# Patient Record
Sex: Female | Born: 1940 | Race: Black or African American | Hispanic: No | Smoking: Former smoker
Health system: Southern US, Community
[De-identification: ages and names within clinical notes are randomized; demographics above are authoritative.]

## PROBLEM LIST (undated history)

## (undated) DIAGNOSIS — M810 Age-related osteoporosis without current pathological fracture: Secondary | ICD-10-CM

## (undated) DIAGNOSIS — J45909 Unspecified asthma, uncomplicated: Secondary | ICD-10-CM

## (undated) DIAGNOSIS — J449 Chronic obstructive pulmonary disease, unspecified: Secondary | ICD-10-CM

## (undated) DIAGNOSIS — I011 Acute rheumatic endocarditis: Secondary | ICD-10-CM

## (undated) DIAGNOSIS — I10 Essential (primary) hypertension: Secondary | ICD-10-CM

## (undated) DIAGNOSIS — E785 Hyperlipidemia, unspecified: Secondary | ICD-10-CM

## (undated) DIAGNOSIS — M1990 Unspecified osteoarthritis, unspecified site: Secondary | ICD-10-CM

## (undated) DIAGNOSIS — M797 Fibromyalgia: Secondary | ICD-10-CM

## (undated) DIAGNOSIS — J40 Bronchitis, not specified as acute or chronic: Secondary | ICD-10-CM

## (undated) DIAGNOSIS — G4730 Sleep apnea, unspecified: Secondary | ICD-10-CM

## (undated) HISTORY — PX: TOTAL KNEE ARTHROPLASTY: SHX125

## (undated) HISTORY — DX: Unspecified osteoarthritis, unspecified site: M19.90

## (undated) HISTORY — DX: Fibromyalgia: M79.7

## (undated) HISTORY — DX: Sleep apnea, unspecified: G47.30

## (undated) HISTORY — PX: JOINT REPLACEMENT: SHX530

## (undated) HISTORY — PX: KNEE ARTHROPLASTY: SHX992

## (undated) HISTORY — DX: Hyperlipidemia, unspecified: E78.5

## (undated) HISTORY — DX: Acute rheumatic endocarditis: I01.1

## (undated) HISTORY — DX: Unspecified asthma, uncomplicated: J45.909

## (undated) HISTORY — DX: Age-related osteoporosis without current pathological fracture: M81.0

---

## 1997-12-08 ENCOUNTER — Ambulatory Visit (HOSPITAL_COMMUNITY): Admission: RE | Admit: 1997-12-08 | Discharge: 1997-12-08 | Payer: Self-pay | Admitting: Internal Medicine

## 1998-06-27 ENCOUNTER — Ambulatory Visit (HOSPITAL_COMMUNITY): Admission: RE | Admit: 1998-06-27 | Discharge: 1998-06-27 | Payer: Self-pay | Admitting: Internal Medicine

## 1998-10-06 ENCOUNTER — Encounter: Payer: Self-pay | Admitting: Obstetrics and Gynecology

## 1998-10-06 ENCOUNTER — Ambulatory Visit (HOSPITAL_COMMUNITY): Admission: RE | Admit: 1998-10-06 | Discharge: 1998-10-06 | Payer: Self-pay | Admitting: Obstetrics and Gynecology

## 1999-09-07 ENCOUNTER — Observation Stay (HOSPITAL_COMMUNITY): Admission: AD | Admit: 1999-09-07 | Discharge: 1999-09-08 | Payer: Self-pay | Admitting: Internal Medicine

## 1999-09-07 ENCOUNTER — Encounter: Payer: Self-pay | Admitting: Internal Medicine

## 1999-09-14 ENCOUNTER — Encounter: Admission: RE | Admit: 1999-09-14 | Discharge: 1999-12-13 | Payer: Self-pay | Admitting: Internal Medicine

## 1999-10-15 ENCOUNTER — Encounter: Payer: Self-pay | Admitting: Internal Medicine

## 1999-10-15 ENCOUNTER — Ambulatory Visit (HOSPITAL_COMMUNITY): Admission: RE | Admit: 1999-10-15 | Discharge: 1999-10-15 | Payer: Self-pay | Admitting: Internal Medicine

## 1999-12-22 ENCOUNTER — Encounter: Admission: RE | Admit: 1999-12-22 | Discharge: 2000-03-21 | Payer: Self-pay | Admitting: Internal Medicine

## 1999-12-24 ENCOUNTER — Encounter: Admission: RE | Admit: 1999-12-24 | Discharge: 1999-12-24 | Payer: Self-pay | Admitting: *Deleted

## 1999-12-24 ENCOUNTER — Encounter: Payer: Self-pay | Admitting: Rheumatology

## 2000-01-19 ENCOUNTER — Encounter: Admission: RE | Admit: 2000-01-19 | Discharge: 2000-01-19 | Payer: Self-pay | Admitting: Internal Medicine

## 2000-01-19 ENCOUNTER — Encounter: Payer: Self-pay | Admitting: Internal Medicine

## 2000-02-22 ENCOUNTER — Ambulatory Visit (HOSPITAL_COMMUNITY): Admission: RE | Admit: 2000-02-22 | Discharge: 2000-02-22 | Payer: Self-pay | Admitting: Gastroenterology

## 2000-02-23 ENCOUNTER — Other Ambulatory Visit: Admission: RE | Admit: 2000-02-23 | Discharge: 2000-02-23 | Payer: Self-pay | Admitting: Obstetrics and Gynecology

## 2000-05-02 ENCOUNTER — Encounter (INDEPENDENT_AMBULATORY_CARE_PROVIDER_SITE_OTHER): Payer: Self-pay | Admitting: Specialist

## 2000-05-02 ENCOUNTER — Other Ambulatory Visit: Admission: RE | Admit: 2000-05-02 | Discharge: 2000-05-02 | Payer: Self-pay | Admitting: Obstetrics and Gynecology

## 2000-06-12 ENCOUNTER — Encounter: Admission: RE | Admit: 2000-06-12 | Discharge: 2000-09-10 | Payer: Self-pay | Admitting: Internal Medicine

## 2000-06-29 ENCOUNTER — Encounter: Admission: RE | Admit: 2000-06-29 | Discharge: 2000-06-29 | Payer: Self-pay | Admitting: Internal Medicine

## 2000-06-29 ENCOUNTER — Encounter: Payer: Self-pay | Admitting: Internal Medicine

## 2000-11-24 ENCOUNTER — Ambulatory Visit (HOSPITAL_COMMUNITY): Admission: RE | Admit: 2000-11-24 | Discharge: 2000-11-24 | Payer: Self-pay | Admitting: Internal Medicine

## 2001-03-12 ENCOUNTER — Ambulatory Visit (HOSPITAL_COMMUNITY): Admission: RE | Admit: 2001-03-12 | Discharge: 2001-03-12 | Payer: Self-pay | Admitting: Internal Medicine

## 2001-03-12 ENCOUNTER — Encounter: Payer: Self-pay | Admitting: Internal Medicine

## 2001-06-26 ENCOUNTER — Ambulatory Visit (HOSPITAL_COMMUNITY): Admission: RE | Admit: 2001-06-26 | Discharge: 2001-06-26 | Payer: Self-pay | Admitting: Internal Medicine

## 2001-07-17 ENCOUNTER — Encounter: Payer: Self-pay | Admitting: Orthopedic Surgery

## 2001-07-19 ENCOUNTER — Encounter: Payer: Self-pay | Admitting: Orthopedic Surgery

## 2001-07-19 ENCOUNTER — Inpatient Hospital Stay (HOSPITAL_COMMUNITY): Admission: RE | Admit: 2001-07-19 | Discharge: 2001-07-23 | Payer: Self-pay | Admitting: Orthopedic Surgery

## 2001-07-23 ENCOUNTER — Inpatient Hospital Stay (HOSPITAL_COMMUNITY)
Admission: RE | Admit: 2001-07-23 | Discharge: 2001-07-30 | Payer: Self-pay | Admitting: Physical Medicine & Rehabilitation

## 2001-09-04 ENCOUNTER — Ambulatory Visit (HOSPITAL_COMMUNITY): Admission: RE | Admit: 2001-09-04 | Discharge: 2001-09-04 | Payer: Self-pay | Admitting: Internal Medicine

## 2001-12-26 ENCOUNTER — Encounter: Admission: RE | Admit: 2001-12-26 | Discharge: 2001-12-26 | Payer: Self-pay | Admitting: Orthopedic Surgery

## 2001-12-26 ENCOUNTER — Encounter: Payer: Self-pay | Admitting: Orthopedic Surgery

## 2002-01-09 ENCOUNTER — Encounter: Admission: RE | Admit: 2002-01-09 | Discharge: 2002-01-09 | Payer: Self-pay | Admitting: Orthopedic Surgery

## 2002-01-09 ENCOUNTER — Encounter: Payer: Self-pay | Admitting: Orthopedic Surgery

## 2002-01-23 ENCOUNTER — Encounter: Payer: Self-pay | Admitting: Orthopedic Surgery

## 2002-01-23 ENCOUNTER — Encounter: Admission: RE | Admit: 2002-01-23 | Discharge: 2002-01-23 | Payer: Self-pay | Admitting: Orthopedic Surgery

## 2002-04-09 ENCOUNTER — Inpatient Hospital Stay (HOSPITAL_COMMUNITY): Admission: AD | Admit: 2002-04-09 | Discharge: 2002-04-20 | Payer: Self-pay | Admitting: Internal Medicine

## 2002-04-11 ENCOUNTER — Encounter: Payer: Self-pay | Admitting: Internal Medicine

## 2002-04-11 ENCOUNTER — Encounter (INDEPENDENT_AMBULATORY_CARE_PROVIDER_SITE_OTHER): Payer: Self-pay | Admitting: Cardiology

## 2002-04-23 ENCOUNTER — Inpatient Hospital Stay (HOSPITAL_COMMUNITY): Admission: EM | Admit: 2002-04-23 | Discharge: 2002-04-26 | Payer: Self-pay | Admitting: Emergency Medicine

## 2002-04-25 ENCOUNTER — Encounter: Payer: Self-pay | Admitting: Internal Medicine

## 2002-06-10 ENCOUNTER — Encounter: Admission: RE | Admit: 2002-06-10 | Discharge: 2002-06-10 | Payer: Self-pay | Admitting: Internal Medicine

## 2002-06-10 ENCOUNTER — Encounter: Payer: Self-pay | Admitting: Internal Medicine

## 2002-06-19 ENCOUNTER — Encounter: Payer: Self-pay | Admitting: Internal Medicine

## 2002-06-19 ENCOUNTER — Encounter: Admission: RE | Admit: 2002-06-19 | Discharge: 2002-06-19 | Payer: Self-pay | Admitting: Internal Medicine

## 2002-11-08 ENCOUNTER — Encounter: Admission: RE | Admit: 2002-11-08 | Discharge: 2002-11-08 | Payer: Self-pay | Admitting: Rheumatology

## 2002-11-08 ENCOUNTER — Encounter: Payer: Self-pay | Admitting: Rheumatology

## 2003-01-13 ENCOUNTER — Encounter: Payer: Self-pay | Admitting: Rheumatology

## 2003-01-13 ENCOUNTER — Encounter: Admission: RE | Admit: 2003-01-13 | Discharge: 2003-01-13 | Payer: Self-pay | Admitting: Rheumatology

## 2003-01-14 ENCOUNTER — Other Ambulatory Visit: Admission: RE | Admit: 2003-01-14 | Discharge: 2003-01-14 | Payer: Self-pay | Admitting: Obstetrics and Gynecology

## 2003-02-04 ENCOUNTER — Encounter: Admission: RE | Admit: 2003-02-04 | Discharge: 2003-02-04 | Payer: Self-pay | Admitting: Rheumatology

## 2003-02-04 ENCOUNTER — Encounter: Payer: Self-pay | Admitting: Rheumatology

## 2003-07-07 ENCOUNTER — Encounter: Payer: Self-pay | Admitting: Obstetrics and Gynecology

## 2003-07-07 ENCOUNTER — Encounter: Admission: RE | Admit: 2003-07-07 | Discharge: 2003-07-07 | Payer: Self-pay | Admitting: Obstetrics and Gynecology

## 2003-07-14 ENCOUNTER — Encounter: Payer: Self-pay | Admitting: Rheumatology

## 2003-07-14 ENCOUNTER — Encounter: Admission: RE | Admit: 2003-07-14 | Discharge: 2003-07-14 | Payer: Self-pay | Admitting: Rheumatology

## 2003-08-05 ENCOUNTER — Emergency Department (HOSPITAL_COMMUNITY): Admission: EM | Admit: 2003-08-05 | Discharge: 2003-08-06 | Payer: Self-pay | Admitting: Emergency Medicine

## 2003-08-08 ENCOUNTER — Encounter: Admission: RE | Admit: 2003-08-08 | Discharge: 2003-08-08 | Payer: Self-pay | Admitting: Rheumatology

## 2003-09-10 ENCOUNTER — Encounter: Admission: RE | Admit: 2003-09-10 | Discharge: 2003-09-10 | Payer: Self-pay | Admitting: Rheumatology

## 2003-10-06 ENCOUNTER — Encounter: Admission: RE | Admit: 2003-10-06 | Discharge: 2003-10-06 | Payer: Self-pay | Admitting: Rheumatology

## 2003-12-02 ENCOUNTER — Encounter: Admission: RE | Admit: 2003-12-02 | Discharge: 2003-12-02 | Payer: Self-pay | Admitting: Internal Medicine

## 2003-12-11 ENCOUNTER — Inpatient Hospital Stay (HOSPITAL_COMMUNITY): Admission: AD | Admit: 2003-12-11 | Discharge: 2003-12-18 | Payer: Self-pay | Admitting: Internal Medicine

## 2004-04-21 ENCOUNTER — Ambulatory Visit (HOSPITAL_COMMUNITY): Admission: RE | Admit: 2004-04-21 | Discharge: 2004-04-21 | Payer: Self-pay | Admitting: Cardiology

## 2004-07-14 ENCOUNTER — Encounter: Admission: RE | Admit: 2004-07-14 | Discharge: 2004-07-14 | Payer: Self-pay | Admitting: Internal Medicine

## 2004-10-18 ENCOUNTER — Ambulatory Visit: Payer: Self-pay | Admitting: Internal Medicine

## 2004-10-20 ENCOUNTER — Encounter: Admission: RE | Admit: 2004-10-20 | Discharge: 2004-10-20 | Payer: Self-pay | Admitting: Internal Medicine

## 2004-10-26 ENCOUNTER — Emergency Department (HOSPITAL_COMMUNITY): Admission: EM | Admit: 2004-10-26 | Discharge: 2004-10-26 | Payer: Self-pay | Admitting: Emergency Medicine

## 2004-10-30 IMAGING — CT CT CHEST W/ CM
1 of 2 series · 16 of 32 positions shown, 20 images · IV contrast (omnipaque)
Comparison: none

CLINICAL DATA: Cough.  Bronchitis.  Asthma.  Tachycardia.  
 CT SCAN OF THE CHEST WITH INTRAVENOUS CONTRAST 12/14/03
 Scans were performed using the pulmonary embolus protocol.  The patient was given 150 cc of Omnipaque intravenously.  
 The scan demonstrates no evidence of pulmonary embolus or other acute abnormality.  There is some scarring in the lingula as was reported on the prior CT scan dated 01/19/00.  Heart size is normal.  The patient has a tiny focal eventration of the left hemidiaphragm with some lobulated fat seen medially at the left lung base posteriorly through this small diaphragmatic defect.  This is not significant.  
 There is no hilar or mediastinal adenopathy or other significant abnormality. 
 IMPRESSION
 No acute disease.  Specifically, no evidence of mass lesion other than some herniated abdominal fat.  No pulmonary embolus.

[Series 2: infused scan · axial · 0.70mm/px · z∈[-272,-23]mm · 16 of 206 slices shown, 20 images]
[im 13/206  mediastinal]
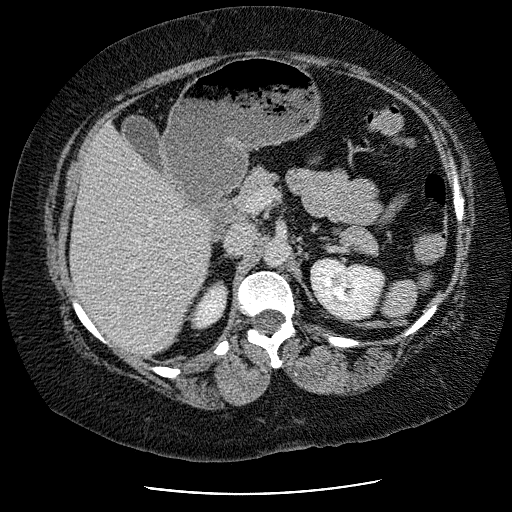
[im 13/206  lung]
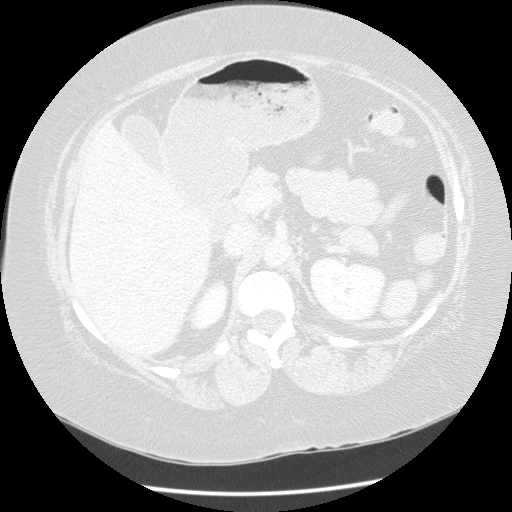
[im 25/206  lung]
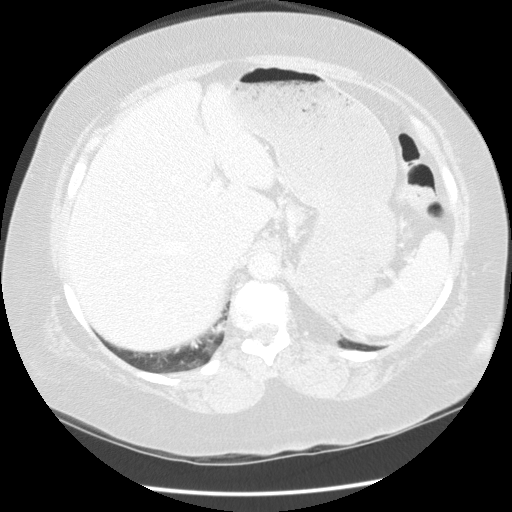
[im 37/206  lung]
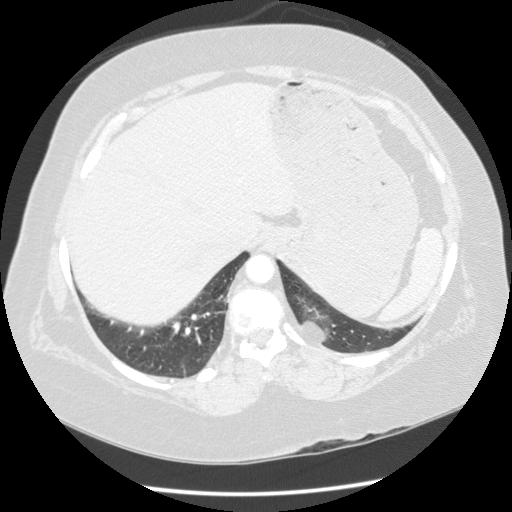
[im 61/206  lung]
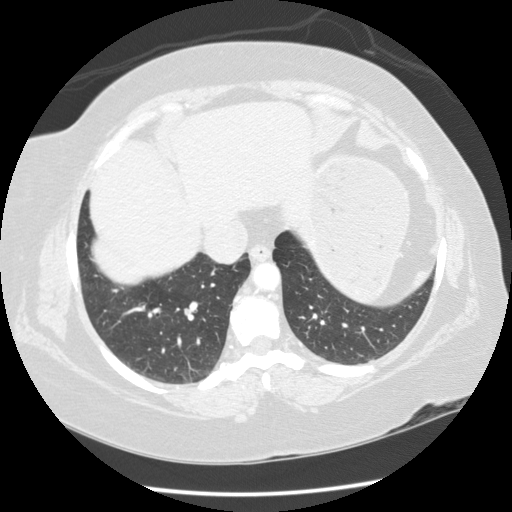
[im 69/206  mediastinal]
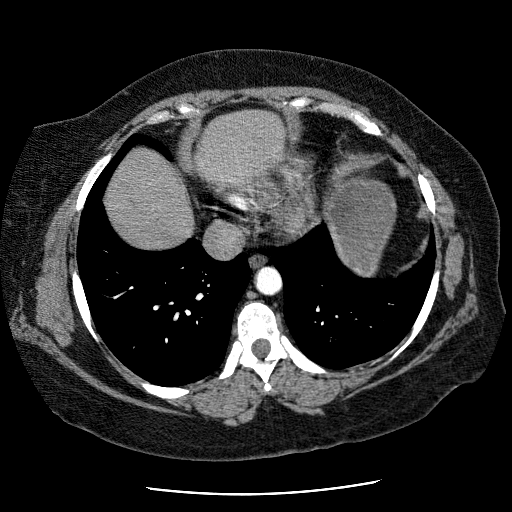
[im 69/206  lung]
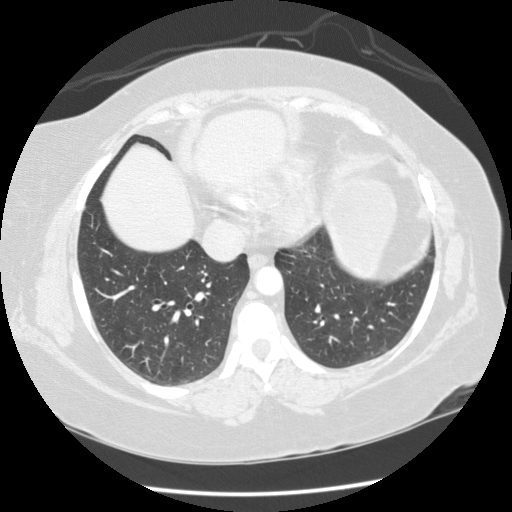
[im 73/206  lung]
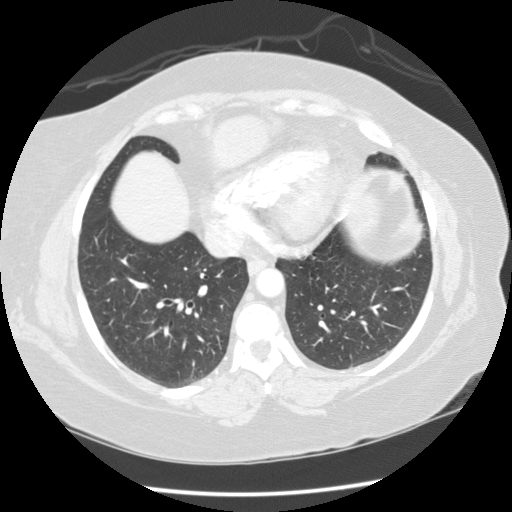
[im 81/206  lung]
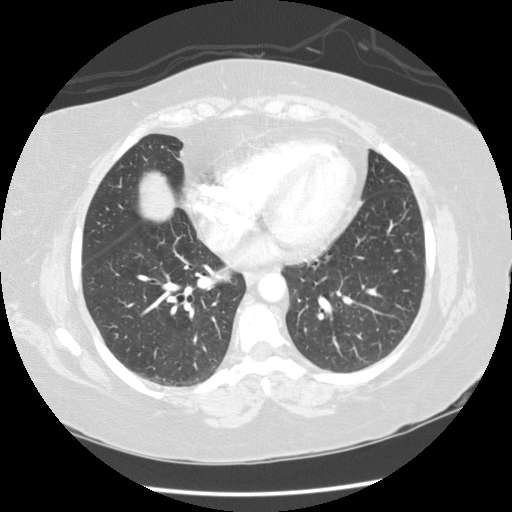
[im 85/206  lung]
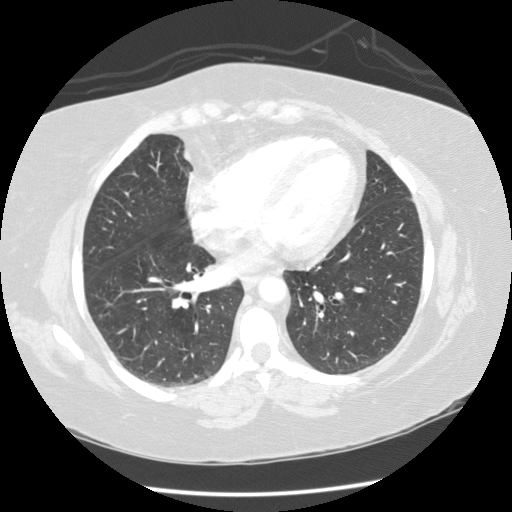
[im 109/206  mediastinal]
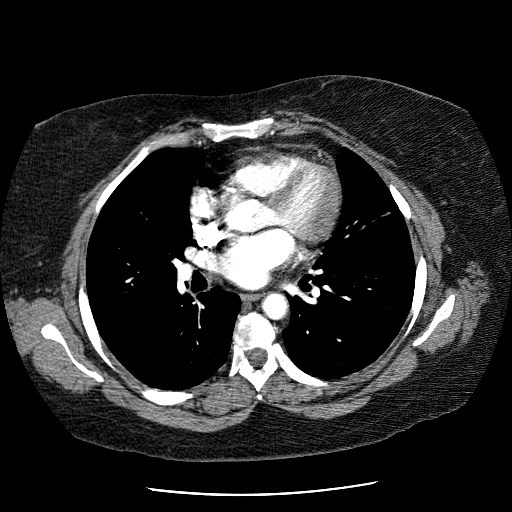
[im 109/206  lung]
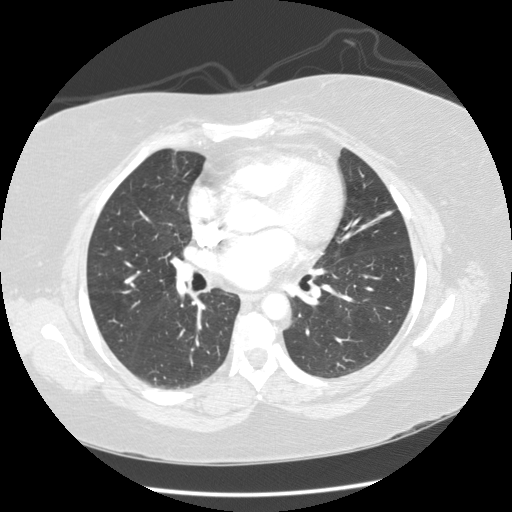
[im 121/206  lung]
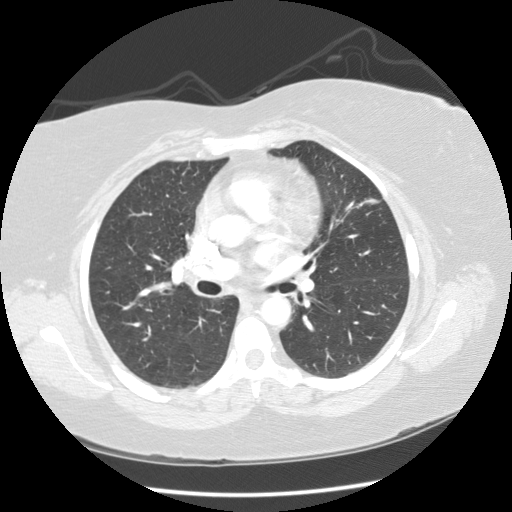
[im 133/206  lung]
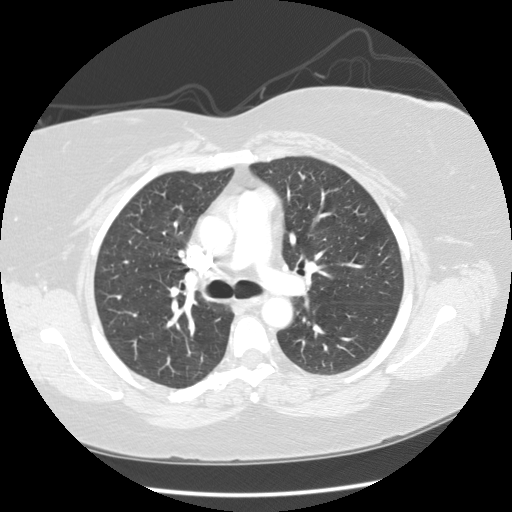
[im 137/206  lung]
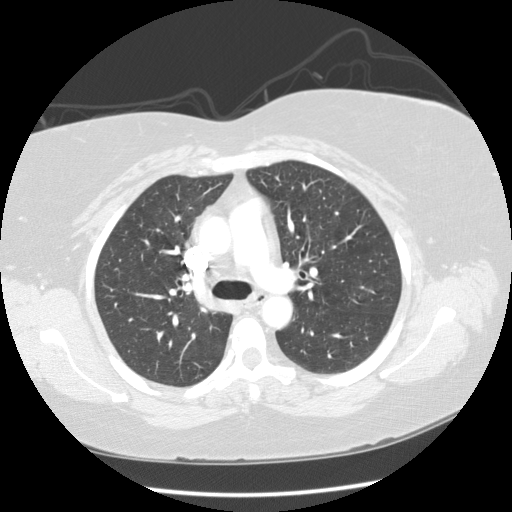
[im 145/206  mediastinal]
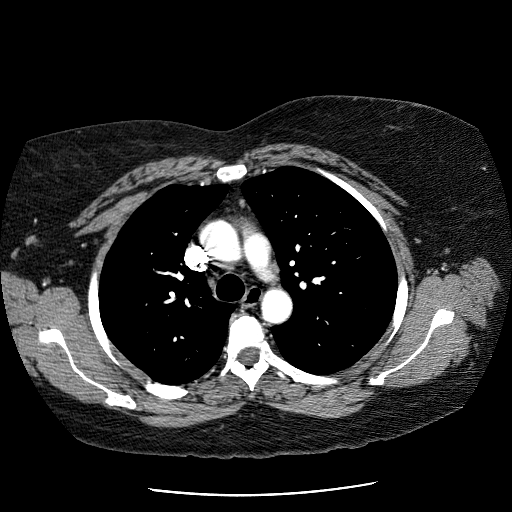
[im 145/206  lung]
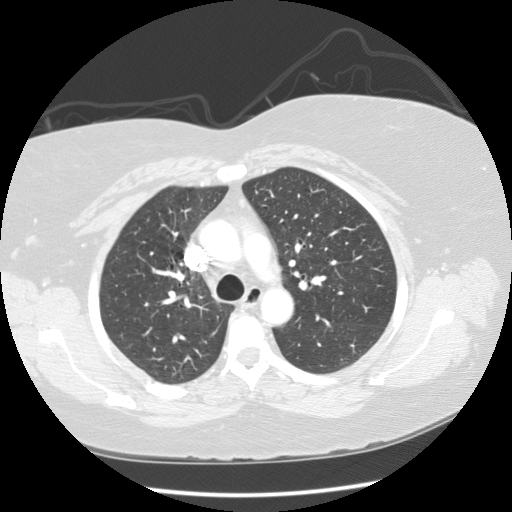
[im 169/206  lung]
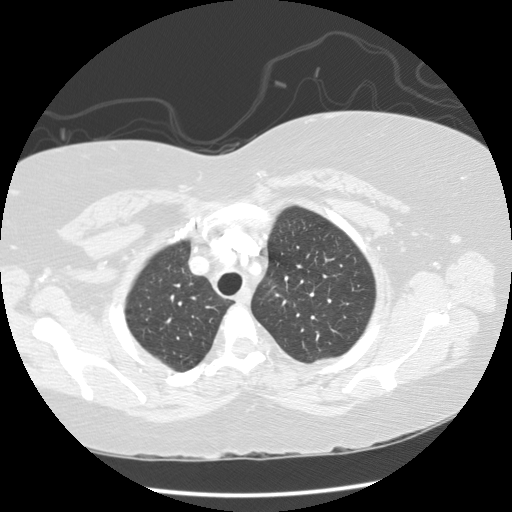
[im 181/206  lung]
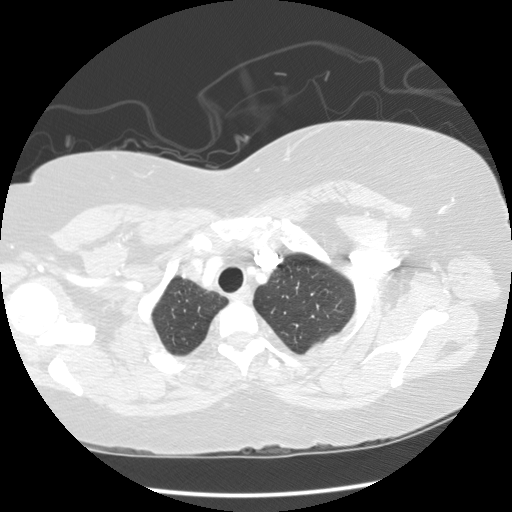
[im 193/206  lung]
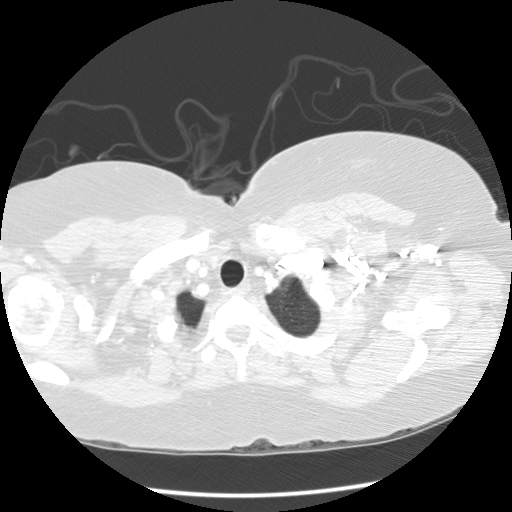

[16 of 32 positions shown; findings below may reference images not displayed]

## 2004-11-16 ENCOUNTER — Ambulatory Visit: Payer: Self-pay | Admitting: Internal Medicine

## 2004-12-29 ENCOUNTER — Encounter: Admission: RE | Admit: 2004-12-29 | Discharge: 2005-03-29 | Payer: Self-pay | Admitting: Internal Medicine

## 2005-01-03 ENCOUNTER — Ambulatory Visit: Payer: Self-pay | Admitting: Internal Medicine

## 2005-04-21 ENCOUNTER — Encounter: Admission: RE | Admit: 2005-04-21 | Discharge: 2005-07-20 | Payer: Self-pay | Admitting: Internal Medicine

## 2006-01-04 ENCOUNTER — Encounter: Admission: RE | Admit: 2006-01-04 | Discharge: 2006-01-04 | Payer: Self-pay | Admitting: Internal Medicine

## 2006-02-05 ENCOUNTER — Inpatient Hospital Stay (HOSPITAL_COMMUNITY): Admission: RE | Admit: 2006-02-05 | Discharge: 2006-02-14 | Payer: Self-pay | Admitting: Internal Medicine

## 2006-02-11 ENCOUNTER — Encounter: Payer: Self-pay | Admitting: Cardiology

## 2007-02-18 ENCOUNTER — Emergency Department (HOSPITAL_COMMUNITY): Admission: EM | Admit: 2007-02-18 | Discharge: 2007-02-19 | Payer: Self-pay | Admitting: Emergency Medicine

## 2007-02-19 ENCOUNTER — Inpatient Hospital Stay (HOSPITAL_COMMUNITY): Admission: AD | Admit: 2007-02-19 | Discharge: 2007-02-23 | Payer: Self-pay | Admitting: Internal Medicine

## 2007-02-22 ENCOUNTER — Ambulatory Visit: Payer: Self-pay | Admitting: Vascular Surgery

## 2007-03-14 ENCOUNTER — Encounter: Admission: RE | Admit: 2007-03-14 | Discharge: 2007-03-14 | Payer: Self-pay | Admitting: Internal Medicine

## 2007-04-03 ENCOUNTER — Ambulatory Visit: Payer: Self-pay | Admitting: Internal Medicine

## 2007-04-03 DIAGNOSIS — E119 Type 2 diabetes mellitus without complications: Secondary | ICD-10-CM | POA: Insufficient documentation

## 2007-04-03 DIAGNOSIS — J45909 Unspecified asthma, uncomplicated: Secondary | ICD-10-CM

## 2007-04-03 DIAGNOSIS — M5030 Other cervical disc degeneration, unspecified cervical region: Secondary | ICD-10-CM

## 2007-04-03 DIAGNOSIS — Z8679 Personal history of other diseases of the circulatory system: Secondary | ICD-10-CM | POA: Insufficient documentation

## 2007-04-03 DIAGNOSIS — M5136 Other intervertebral disc degeneration, lumbar region: Secondary | ICD-10-CM | POA: Insufficient documentation

## 2007-04-03 DIAGNOSIS — M1711 Unilateral primary osteoarthritis, right knee: Secondary | ICD-10-CM | POA: Insufficient documentation

## 2007-04-03 DIAGNOSIS — E785 Hyperlipidemia, unspecified: Secondary | ICD-10-CM | POA: Insufficient documentation

## 2007-04-03 DIAGNOSIS — M0579 Rheumatoid arthritis with rheumatoid factor of multiple sites without organ or systems involvement: Secondary | ICD-10-CM

## 2007-04-03 DIAGNOSIS — E039 Hypothyroidism, unspecified: Secondary | ICD-10-CM

## 2007-04-03 DIAGNOSIS — M797 Fibromyalgia: Secondary | ICD-10-CM

## 2007-04-03 DIAGNOSIS — K219 Gastro-esophageal reflux disease without esophagitis: Secondary | ICD-10-CM

## 2007-04-03 DIAGNOSIS — R7611 Nonspecific reaction to tuberculin skin test without active tuberculosis: Secondary | ICD-10-CM | POA: Insufficient documentation

## 2007-04-03 DIAGNOSIS — Z96659 Presence of unspecified artificial knee joint: Secondary | ICD-10-CM

## 2007-04-17 ENCOUNTER — Ambulatory Visit: Payer: Self-pay | Admitting: Internal Medicine

## 2007-04-19 ENCOUNTER — Ambulatory Visit: Payer: Self-pay | Admitting: Internal Medicine

## 2007-04-19 ENCOUNTER — Ambulatory Visit (HOSPITAL_COMMUNITY): Admission: RE | Admit: 2007-04-19 | Discharge: 2007-04-19 | Payer: Self-pay | Admitting: Internal Medicine

## 2007-04-19 LAB — CONVERTED CEMR LAB
ALT: 12 units/L (ref 0–35)
AST: 13 units/L (ref 0–37)
Alkaline Phosphatase: 124 units/L — ABNORMAL HIGH (ref 39–117)
BUN: 16 mg/dL (ref 6–23)
Blood Glucose, Fingerstick: 124
Calcium: 8.9 mg/dL (ref 8.4–10.5)
Creatinine, Ser: 1.07 mg/dL (ref 0.40–1.20)
HCT: 38.1 % (ref 36.0–46.0)
Hemoglobin: 11.8 g/dL — ABNORMAL LOW (ref 12.0–15.0)
MCHC: 31 g/dL (ref 30.0–36.0)
MCV: 89 fL (ref 78.0–100.0)
Platelets: 293 10*3/uL (ref 150–400)
RDW: 15.3 % — ABNORMAL HIGH (ref 11.5–14.0)
Total Bilirubin: 0.4 mg/dL (ref 0.3–1.2)

## 2007-04-20 ENCOUNTER — Encounter: Admission: RE | Admit: 2007-04-20 | Discharge: 2007-06-13 | Payer: Self-pay | Admitting: Neurological Surgery

## 2007-05-31 ENCOUNTER — Encounter: Admission: RE | Admit: 2007-05-31 | Discharge: 2007-06-13 | Payer: Self-pay | Admitting: *Deleted

## 2007-06-03 ENCOUNTER — Inpatient Hospital Stay (HOSPITAL_COMMUNITY): Admission: EM | Admit: 2007-06-03 | Discharge: 2007-06-09 | Payer: Self-pay | Admitting: Emergency Medicine

## 2007-07-03 ENCOUNTER — Encounter (HOSPITAL_COMMUNITY): Admission: RE | Admit: 2007-07-03 | Discharge: 2007-10-01 | Payer: Self-pay | Admitting: Internal Medicine

## 2007-08-27 ENCOUNTER — Encounter: Admission: RE | Admit: 2007-08-27 | Discharge: 2007-08-27 | Payer: Self-pay | Admitting: Rheumatology

## 2007-09-14 ENCOUNTER — Encounter: Admission: RE | Admit: 2007-09-14 | Discharge: 2007-09-14 | Payer: Self-pay | Admitting: Rheumatology

## 2007-10-12 ENCOUNTER — Encounter: Admission: RE | Admit: 2007-10-12 | Discharge: 2007-10-12 | Payer: Self-pay | Admitting: Rheumatology

## 2008-05-15 ENCOUNTER — Ambulatory Visit: Payer: Self-pay | Admitting: Internal Medicine

## 2008-05-15 DIAGNOSIS — G4733 Obstructive sleep apnea (adult) (pediatric): Secondary | ICD-10-CM

## 2008-05-28 ENCOUNTER — Encounter: Payer: Self-pay | Admitting: Internal Medicine

## 2008-06-03 ENCOUNTER — Encounter: Payer: Self-pay | Admitting: Internal Medicine

## 2008-06-16 ENCOUNTER — Ambulatory Visit: Payer: Self-pay | Admitting: Internal Medicine

## 2008-07-30 ENCOUNTER — Ambulatory Visit: Payer: Self-pay | Admitting: Vascular Surgery

## 2008-09-15 ENCOUNTER — Ambulatory Visit: Payer: Self-pay | Admitting: Internal Medicine

## 2008-09-24 ENCOUNTER — Encounter: Payer: Self-pay | Admitting: Internal Medicine

## 2008-11-10 ENCOUNTER — Telehealth: Payer: Self-pay | Admitting: Internal Medicine

## 2009-01-13 ENCOUNTER — Ambulatory Visit: Payer: Self-pay | Admitting: Internal Medicine

## 2009-06-24 ENCOUNTER — Encounter: Admission: RE | Admit: 2009-06-24 | Discharge: 2009-06-24 | Payer: Self-pay | Admitting: Internal Medicine

## 2009-07-16 ENCOUNTER — Emergency Department (HOSPITAL_COMMUNITY): Admission: EM | Admit: 2009-07-16 | Discharge: 2009-07-16 | Payer: Self-pay | Admitting: Emergency Medicine

## 2009-08-19 ENCOUNTER — Encounter: Admission: RE | Admit: 2009-08-19 | Discharge: 2009-08-19 | Payer: Self-pay | Admitting: Internal Medicine

## 2010-02-19 ENCOUNTER — Encounter: Admission: RE | Admit: 2010-02-19 | Discharge: 2010-02-19 | Payer: Self-pay | Admitting: Internal Medicine

## 2010-06-02 IMAGING — CR DG CHEST 2V
2 series · 2 of 2 positions shown · non-contrast
Comparison: Chest x-ray of 06/24/2009

CLINICAL DATA: Shortness of breath, chest pain, asthma

CHEST - 2 VIEW

[w chest pa]
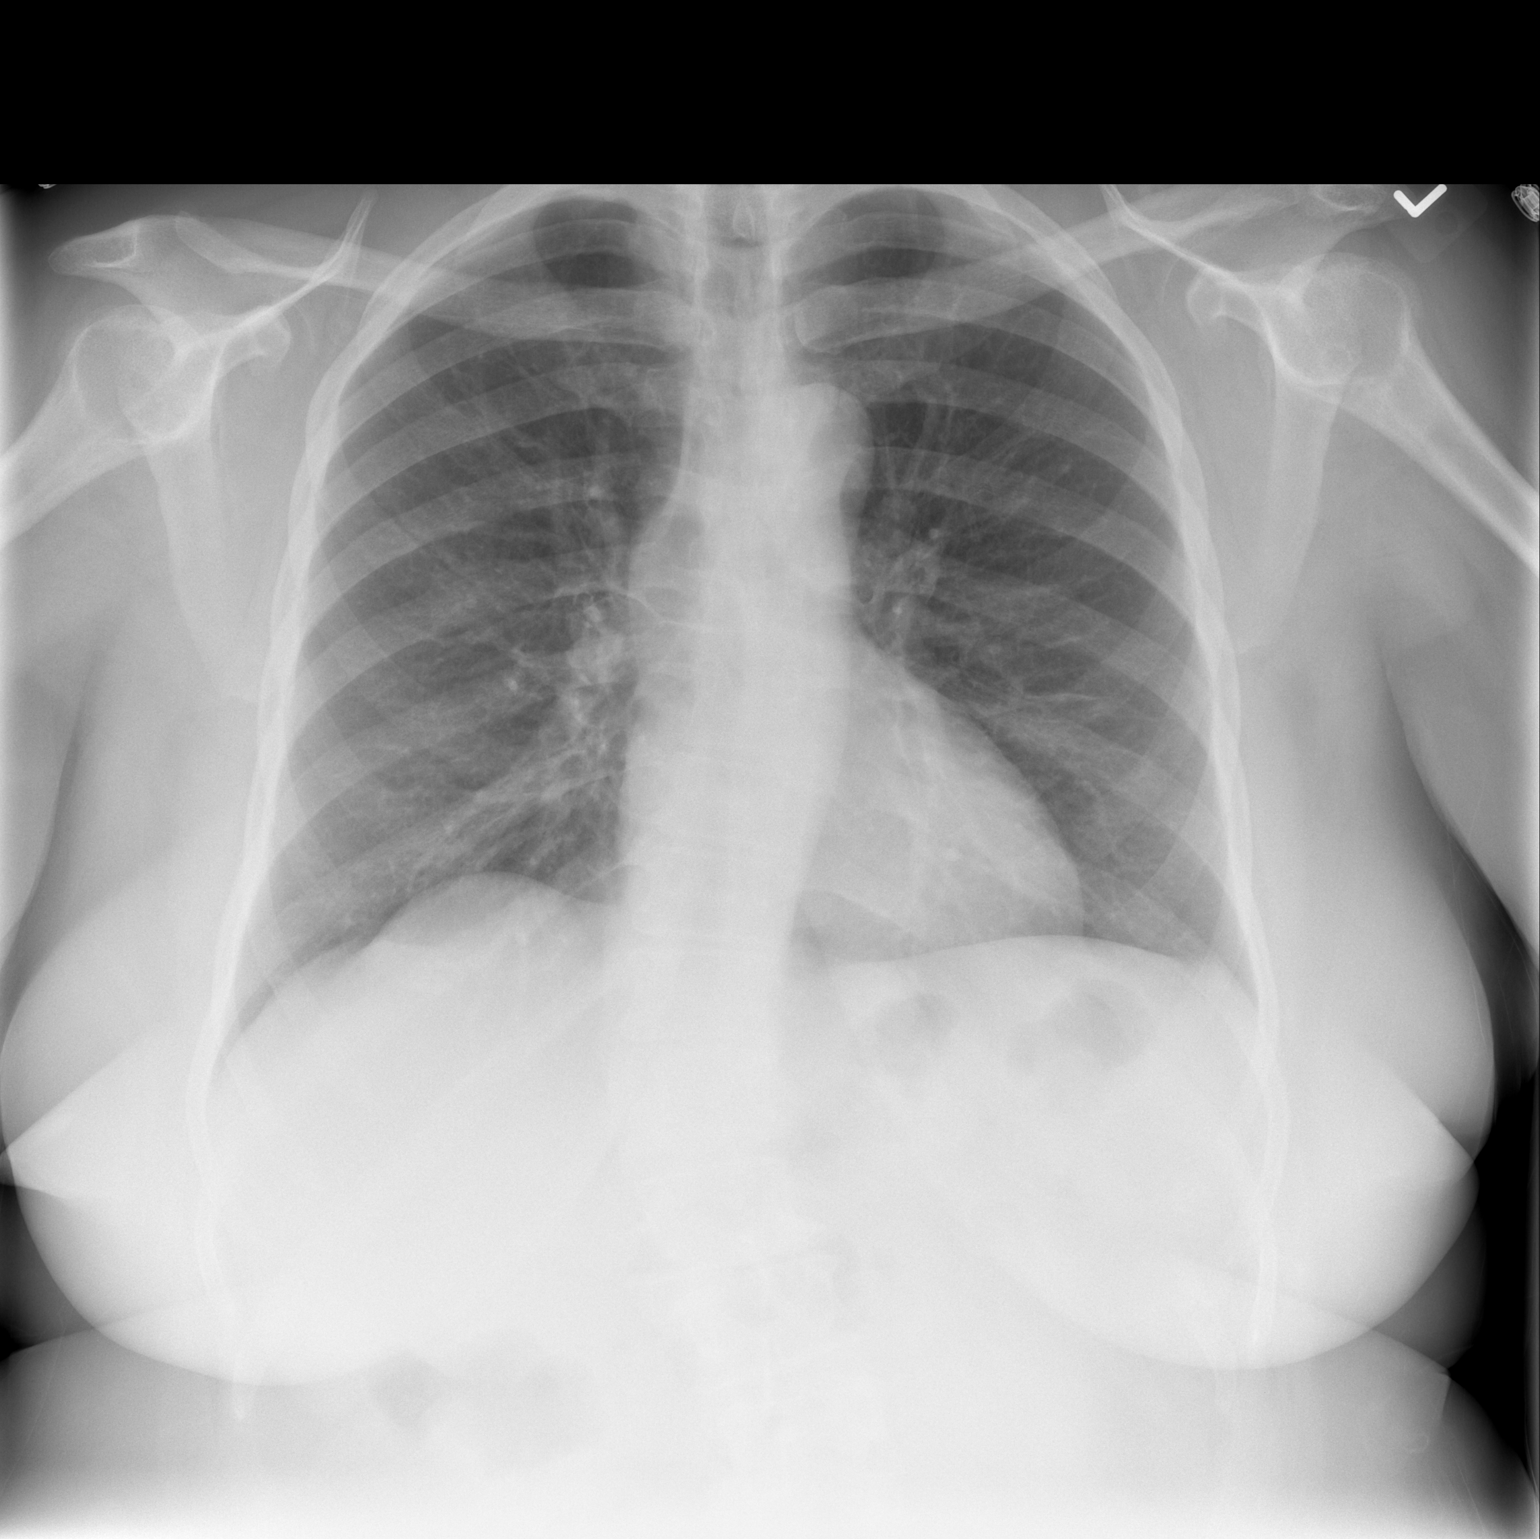

[w chest lat]
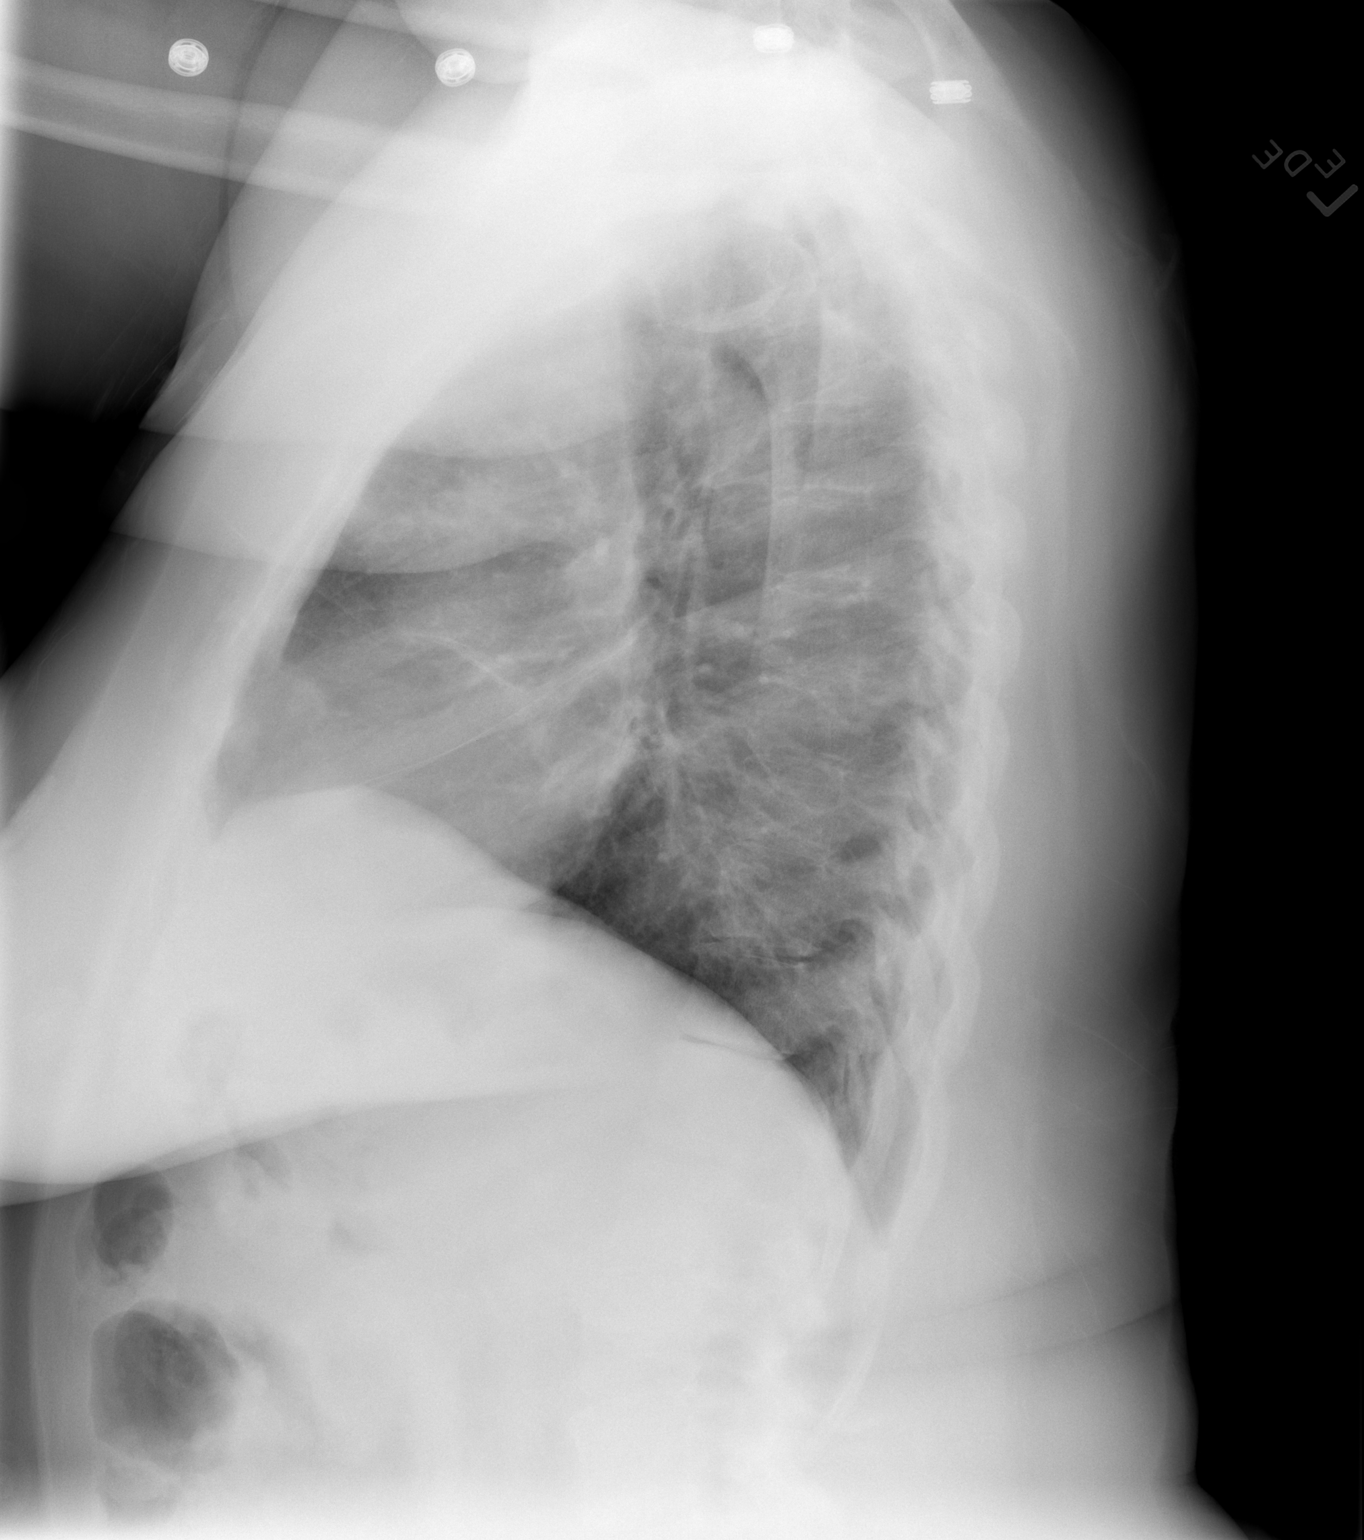

[2 of 2 positions shown; findings below may reference images not displayed]

FINDINGS: No focal infiltrate or effusion is seen.  Mild
peribronchial thickening is present which may indicate bronchitis.
The heart is within normal limits in size. There is mild
thoracolumbar scoliosis present.
IMPRESSION: No pneumonia.  Cannot exclude mild bronchitis.  Scoliosis.

## 2010-06-22 ENCOUNTER — Ambulatory Visit: Payer: Self-pay | Admitting: Cardiology

## 2010-06-25 ENCOUNTER — Ambulatory Visit (HOSPITAL_COMMUNITY): Admission: RE | Admit: 2010-06-25 | Discharge: 2010-06-25 | Payer: Self-pay | Admitting: Internal Medicine

## 2010-10-01 ENCOUNTER — Emergency Department (HOSPITAL_COMMUNITY)
Admission: EM | Admit: 2010-10-01 | Discharge: 2010-10-01 | Payer: Self-pay | Source: Home / Self Care | Admitting: Emergency Medicine

## 2010-10-23 ENCOUNTER — Encounter: Payer: Self-pay | Admitting: Obstetrics and Gynecology

## 2010-10-24 ENCOUNTER — Encounter: Payer: Self-pay | Admitting: Rheumatology

## 2010-10-24 ENCOUNTER — Encounter: Payer: Self-pay | Admitting: Internal Medicine

## 2010-12-13 LAB — DIFFERENTIAL
Basophils Absolute: 0 10*3/uL (ref 0.0–0.1)
Basophils Relative: 0 % (ref 0–1)
Eosinophils Relative: 2 % (ref 0–5)
Monocytes Absolute: 1.1 10*3/uL — ABNORMAL HIGH (ref 0.1–1.0)
Monocytes Relative: 10 % (ref 3–12)
Neutro Abs: 5.4 10*3/uL (ref 1.7–7.7)

## 2010-12-13 LAB — POCT I-STAT, CHEM 8
Calcium, Ion: 1.17 mmol/L (ref 1.12–1.32)
Chloride: 107 mEq/L (ref 96–112)
Creatinine, Ser: 1.3 mg/dL — ABNORMAL HIGH (ref 0.4–1.2)
Glucose, Bld: 161 mg/dL — ABNORMAL HIGH (ref 70–99)
HCT: 42 % (ref 36.0–46.0)
Hemoglobin: 14.3 g/dL (ref 12.0–15.0)
Potassium: 4.2 mEq/L (ref 3.5–5.1)

## 2010-12-13 LAB — POCT CARDIAC MARKERS
CKMB, poc: <1 ng/mL — ABNORMAL LOW (ref 1.0–8.0)
Troponin i, poc: <0.05 ng/mL (ref 0.00–0.09)

## 2010-12-13 LAB — CBC
HCT: 40.7 % (ref 36.0–46.0)
Hemoglobin: 12.8 g/dL (ref 12.0–15.0)
MCHC: 31.4 g/dL (ref 30.0–36.0)
MCV: 85.3 fL (ref 78.0–100.0)
RDW: 14.5 % (ref 11.5–15.5)

## 2011-01-06 LAB — DIFFERENTIAL
Eosinophils Absolute: 0.1 10*3/uL (ref 0.0–0.7)
Eosinophils Relative: 1 % (ref 0–5)
Lymphocytes Relative: 26 % (ref 12–46)
Lymphs Abs: 2.8 10*3/uL (ref 0.7–4.0)
Monocytes Absolute: 0.5 10*3/uL (ref 0.1–1.0)
Monocytes Relative: 4 % (ref 3–12)

## 2011-01-06 LAB — BASIC METABOLIC PANEL
Chloride: 103 mEq/L (ref 96–112)
GFR calc non Af Amer: 46 mL/min — ABNORMAL LOW (ref >60)
Potassium: 4 mEq/L (ref 3.5–5.1)
Sodium: 140 mEq/L (ref 135–145)

## 2011-01-06 LAB — CBC
HCT: 42.2 % (ref 36.0–46.0)
Hemoglobin: 14.2 g/dL (ref 12.0–15.0)
MCV: 86.2 fL (ref 78.0–100.0)
WBC: 10.9 10*3/uL — ABNORMAL HIGH (ref 4.0–10.5)

## 2011-02-15 NOTE — Procedures (Signed)
DUPLEX DEEP VENOUS EXAM - LOWER EXTREMITY   INDICATION:  Bilateral leg pain and swelling.   HISTORY:  Edema:  Both feet for a week.  Trauma/Surgery:  Left knee replacement 9 year ago.  Pain:  No.  PE:  No.  Previous DVT:  No.  Anticoagulants:  No.  Other:  Arthritis.   DUPLEX EXAM:                CFV   SFV   PopV  PTV    GSV                R  L  R  L  R  L  R   L  R  L  Thrombosis    o  o  o  o  o  o  o   o  o  o  Spontaneous   +  +  +  +  +  +  +   +  +  +  Phasic        +  +  +  +  +  +  +   +  +  +  Augmentation  +  +  +  +  +  +  +   +  +  +  Compressible  +  +  +  +  +  +  +   +  +  +  Competent     +  +  +  +  +  +  +   +  +  +   Legend:  + - yes  o - no  p - partial  D - decreased   IMPRESSION:  1. No evidence of deep vein thrombosis noted in the bilateral lower      extremities.  2. Preliminary report was faxed to Dr. Diamantina Providence office on 07/30/2008.    _____________________________  V. Charlena Cross, MD   CH/MEDQ  D:  07/30/2008  T:  07/30/2008  Job:  [id number]

## 2011-02-15 NOTE — Discharge Summary (Signed)
[name], [name]            ACCOUNT NO.:  [id number]   MEDICAL RECORD NO.:  [id number]          PATIENT TYPE:  INP   LOCATION:  4713                         FACILITY:  [facility]   PHYSICIAN:  Eric L. August Saucer, M.D.     DATE OF BIRTH:  [birth year]   DATE OF ADMISSION:  02/19/2007  DATE OF DISCHARGE:  02/23/2007                               DISCHARGE SUMMARY   FINAL DIAGNOSES:  1. Chest pain 86.59.  2. Hypertension 41.9.  3. Diabetes mellitus type 2 250.00.  4. Hyperlipidemia 272.45.  5. Hypothyroidism 244.96.  6. Osteoarthrosis 715.00.  7. Obstructive sleep apnea 327.23.  8. Asthma without status asthmaticus 493.90.  9. Tobacco use disorder by history 305.1.  10.Rheumatoid arthritis 714.0.  11.Cervical spondylosis 721.0.  12.Stenosis of coronary arteries 433.10.   OPERATIONS/PROCEDURES:  Persantine Myoview study per Dr. Sharyn Lull.   HISTORY OF PRESENT ILLNESS:  This is one of several [facility]  admissions for this 70 year old black female with a past medical history  significant for hypertension, non-insulin-dependent diabetes mellitus,  hyperlipidemia, hypothyroidism, degenerative joint disease and sleep  apnea with history of bronchoasthma, who came to the emergency room  complaining of new onset of retrosternal left-sided squeezing chest pain  off-and-on.  She also noted that she had pain radiating to both sides of  her neck down into her arms with nausea, mild shortness of breath.  She  has had right arm tingling and numbness sensation as well.  States the  chest pain is grade 7/10 lasting for a few seconds to a few minutes and  relieved on its own.  Chest pain occasionally increased with local  pressure.  She denied palpitations, lightheadedness or syncope.  Denies  PND, orthopnea or leg swelling.  Occasional cough, no fever or chills.   PAST MEDICAL HISTORY/PHYSICAL EXAMINATION:  As per mentioned in H&P.  Patient was admitted per Dr. Sharyn Lull for further  evaluation thereafter.   HOSPITAL COURSE:  The patient was admitted for further evaluation of  chest pain.  She was placed on telemetry.  She was started on IV  Lovenox.  She was given Imdur, aspirin and beta blockers as well.  The  first 24 hours her pain did improve with sublingual nitroglycerin as  needed.  She subsequently underwent a Persantine Myoview on the next  day.  This was found to be positive stress test due to chest pain only,  however no acute ischemic changes were documented on her subsequent  study.  The patient notably continued to complain of increasing neck and  arm symptoms, more on the right than the left.  Possibility of cervical  disc disease was entertained as well.  She subsequently underwent a CT  scan of the cervical spine.  She was found to have evidence for cervical  spondylosis with foraminal narrowing, most marked at the right C8 nerve  root.  The results were discussed with patient.  Given her persistent  symptoms, she was given IV Toradol as well as p.o. Ultram for pain.  She  was seen in consultation by neurosurgery.  An MRI scan was done showing  possible foraminal stenosis at C8-T1, right greater than left.  She also  was noted to have a carotid bruit with a carotid Doppler being done  showing 60 to 80% stenosis on the right internal carotid.  She was  treated medically thereafter.  It was recommended that patient continue  her present regimen for her neck symptoms, as she had obtained  significant improvement with medical management.  She will be due to see  Dr. Danielle Dess as an outpatient for followup of her C spine predicament.   Patient's blood sugars were managed during the hospital and she did well  with this.  The issue of her ongoing problems with sleep apnea and her  noncompliance was reviewed with patient again, as she had not been using  her CPAP machine on a regular basis.  Close followup will need to be  pursued at discharge.   Patient was  subsequently discharged home much improved on Feb 23, 2007.  Her medications at the time of discharge consisted of the following:   1. Aspirin 81 mg q.d.  2. Imdur 30 mg q.d.  3. Lopressor 25 mg b.i.d.  4. Protonix 40 mg q.d.  5. Lipitor 20 mg q.d.  6. Ultram 50 mg q.i.d. as needed for pain.  7. Amaryl 2 mg 1/2 tablet q.a.m.  8. Synthroid 50 mcg q.d.   She will be maintained on a no-concentrated sweets diet.  The use of her  CPAP machine was strongly emphasized as well.  She will be seen in the  office in two weeks' time.           ______________________________  Lind Guest August Saucer, M.D.     ELD/MEDQ  D:  03/28/2007  T:  03/28/2007  Job:  [id number]

## 2011-02-15 NOTE — Consult Note (Signed)
[name], [name]            ACCOUNT NO.:  [id number]   MEDICAL RECORD NO.:  [id number]          PATIENT TYPE:  INP   LOCATION:  4713                         FACILITY:  [facility]   PHYSICIAN:  Stefani Dama, M.D.  DATE OF BIRTH:  01/16/1941   DATE OF CONSULTATION:  02/22/2007  DATE OF DISCHARGE:                                 CONSULTATION   REQUEST PHYSICIAN:  Eric L. August Saucer, M.D.   REASON FOR REQUEST:  Neck, shoulder, and right arm pain for 2-3 months.   HISTORY OF PRESENT ILLNESS:  Ms. [name] is a 70 year old right-  handed individual who tells me that she has had neck, shoulder, and  right arm pain for a period of 2-3 months' time.  She was admitted a few  days ago with an acute episode of left shoulder, arm, and chest pain and  was worked up for this process and it was found to be of noncardiac  origin.  The patient does have a significant history in the past of  hypertension, non-insulin-dependent diabetes mellitus,  hypercholesterolemia, hypothyroidism, degenerative joint disease, sleep  apnea.  She has a history of bronchial asthma in the past also.  Her  past medical history is as noted above and the patient notes that this  pain has been ongoing for past 2-3 months.  She has not been able to  tolerate an MRI, as she notes she is severely claustrophobic.  A CT scan  was performed during this hospitalization and demonstrates some  spondylitic changes, most notably at the C7-T1 junction, which would  involve the C8 nerve root.  The other areas demonstrate some typical  spondylitic changes but no evidence of cord compression or nerve root  compromise.   PHYSICAL EXAMINATION:  GENERAL:  At the current time reveals that she is  an alert, oriented, and cooperative individual in no obvious distress.  NEUROLOGIC:  Her cranial nerves reveal her pupils are 4-mm equally  brisk, reactive to light and accommodation.  The extraocular movements  are full.  Face is  symmetric.  Tongue and uvula are in the midline.  Sclerae and conjunctivae are clear.  Upper extremities reveal that there  is no evidence of a drift.  There is no evidence of any weakness in the  major muscle groups including the deltoids, biceps, triceps, grips, and  intrinsics.  Deep tendon reflexes are 1+ in the biceps, absent in the  triceps, 2+ in the patellae and the Achilles.  Babinski's are downgoing.  NECK:  Reveals some tenderness to palpation of supraclavicular fossa on  the right side compared to the left side.  No masses are noted.  No  bruits are heard.   IMPRESSION:  The patient has evidence of cervical spondylitic disease  both by history and by radiographic appearance on the CT scan.   PLAN:  The patient should undergo an MRI of the cervical spine, though  she tells me she is severely claustrophobic.  I discussed this with her  and also with the radiology staff to hopefully make her study possible.  The patient noted that she did not do  well with Valium, and does not  like opioid analgesics and we will therefore try to avoid these and  hopefully we can get her by just by enlisting her support to cooperate  with the MRI.     Stefani Dama, M.D.  Electronically Signed    HJE/MEDQ  D:  02/22/2007  T:  02/22/2007  Job:  [id number]

## 2011-02-15 NOTE — Discharge Summary (Signed)
NAMEELLIS, KOFFLER            ACCOUNT NO.:  [id number]   MEDICAL RECORD NO.:  [id number]          PATIENT TYPE:  INP   LOCATION:  2032                         FACILITY:  [facility]   PHYSICIAN:  Eric L. August Saucer, M.D.     DATE OF BIRTH:  Jan 15, 1941   DATE OF ADMISSION:  06/02/2007  DATE OF DISCHARGE:  06/09/2007                               DISCHARGE SUMMARY   FINAL DIAGNOSES:  1. Pneumonia, 586.  2. Asthma with acute exacerbation, 493.92  3. Hypertension, 41.9.  4. Diabetes mellitus, type 2, 250.00.  5. Hypothyroidism, 244.9.  6. Hyperlipidemia, 272.4  7. Coronary atherosclerosis of native coronary vessel, 414.01.  8. Sleep apnea, 780.57.   OPERATIONS AND PROCEDURES:  None.   HISTORY OF PRESENT ILLNESS:  This was the first recent [facility] admission for this 70 year old black female with a past medical  history significant for hypertension, non-insulin-dependent diabetes  mellitus, history of SVT, history of bronchial asthma, hypothyroidism,  and hyperlipidemia with mild coronary artery disease and polymyalgia  rheumatica who came to the emergency room complaining of progressive  increasing shortness of breath for the past week.  Over the past 2 days  prior to admission, she had noted marked worsening of her breathing and  subsequently came to the emergency room for further evaluation after  developing a low-grade fever as well.  The patient initially received  steroids and nebulizers in the emergency room without significant  improvement.  She was subsequently admitted for evaluation.  Dr. Sharyn Lull  was called to admit the patient for further evaluation and therapy.   Past medical history and physical exam as per admission H&P.   HOSPITAL COURSE:  The patient was admitted for further evaluation of  acute shortness of breath.  Question of asthmatic bronchitis versus  pneumonia.  A chest x-ray showed only a question of a pneumonia.  Subsequent CT scan demonstrated a  small right lower lobe infiltrate.  The patient was placed on IV antibiotics consisting of Zithromax and  Rocephin.  In view of her bronchial asthma, she was also started on Solu-  Medrol.  The patient was known to have diabetes, and she was placed on a  sliding scale insulin regimen as well.  Over the subsequent days, she  did make steady improvement.  She was placed on incentive spirometry as  well.  Notably, the patient also has polymyalgia rheumatica.  With the  use of IV Solu-Medrol, her joint symptoms improved considerably as well.   With continued supportive measures, the patient made steady improvement.  A followup chest x-ray demonstrated a left base infiltrate with improved  aeration as well.  Eventually, with continued therapy, the patient's  symptoms resolved.  She was made more ambulatory thereafter.  Once the  patient's status became stable, she was transitioned to oral medicines.  She ambulated further and tolerated activity well.  She was subsequently  felt to be stable for discharge.   At the time of discharge, she was feeling well without significant  shortness of breath.   DISCHARGE MEDICATIONS:  1. Aspirin 81 mg daily.  2. Diltiazem 60  mg q.6h.  3. Protonix 40 mg b.i.d.  4. Zocor 40 mg daily.  5. Acetaminophen 650 mg q.8h. p.r.n.  6. Prednisone dose pack 10 mg as directed.  7. Flovent 110 mcg 2 puffs b.i.d.  8. Avelox 400 mg daily for 4 days.  9. Xopenex HFA 2 puffs p.o. q.4-6h. p.r.n.  10.She will also be on Lantus 10 units subcu q.h.s.  11.She will resume her Plaquenil and levothyroxine as before.   DIET:  She will be maintained on a no concentrated sweets diet.   FOLLOW UP:  Followup in the office in two weeks' time.           ______________________________  Lind Guest August Saucer, M.D.     ELD/MEDQ  D:  07/18/2007  T:  07/19/2007  Job:  [id number]

## 2011-02-18 NOTE — Discharge Summary (Signed)
[facility]  Patient:    [name], [name] Visit Number: [id number] MRN: [id number]          Service Type: [facility] Location: [id number] Attending Physician:  Herold Harms Dictated by:   Lucita Lora, M.D. Admit Date:  07/23/2001 Discharge Date: 07/30/2001                             Discharge Summary  DISCHARGE DIAGNOSES: 1. 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 2. Diabetes mellitus. 3. History of thyroid disease. 4. History of asthma.  HISTORY OF PRESENT ILLNESS:  The patient is a 70 year old black female admitted on October 17, with history of bilateral surgical debridement, presents with end-stage DJD of the left knee.  No relief from conservative care. The patient underwent a left total knee replacement on October 17, by Loreta Ave, M.D.  The patient is on Coumadin for DVT prophylaxis. Postoperative complications included anemia.  PT report indicated the patient was ambulating 20 feet with standard walker minimal assist, could transfer sit to stand with minimal assist.  The patient was transfused two units of packed red blood cells prior to transfer to rehabilitation.  PAST MEDICAL HISTORY:  Significant for asthma, sleep apnea, thyroid disease, diabetes mellitus, and obesity.  Questionable history of rheumatoid arthritis. Leg edema secondary to prednisone.  PAST SURGICAL HISTORY:  Significant for arthroscopy of the knee.  ALLERGIES:  CODEINE, VALIUM, DARVON.  FAMILY HISTORY:  Noncontributory.  PRIMARY CARE Tejon Gracie:  Minerva Areola L. August Saucer, M.D.  MEDICATIONS: 1. Advair 100/50 one spray q.p.m. 2. Nasocort. 3. Synthroid 50 mcg. 4. Actos 30 mg. 5. Aspirin 81 mg. 6. Iron 325 mg. 7. Elavil 25 mg p.r.n. 8. Antevert 12.5 mg q.d.  REVIEW OF SYSTEMS:  Negative for chest pain, or nausea and vomiting.  SOCIAL HISTORY:  The patient lives alone in [place].  She is retired.   She was ambulating independently with cane prior to admission.  She lives in one-level home with three steps to entry.  Her son works in area who can assist.  The patient has occasional alcohol use, but denies any smoking at this time.  She quit in 1997.  HOSPITAL COURSE:  Ms. Raffety was admitted to [facility] on July 23, 2001, where she received more than three hours of PT and OT therapy daily.  Ms. Finks progressed fairly well during her rehabilitation and she had no major medical complications during her 7-day stay.  She remained on Coumadin and Lovenox as q.injection until INR was therapeutic as DVT prophylaxis.  Her CBGs remained in fair control while she was in rehabilitation.  She remained on Actos 30 mg p.o. q.d. during entire stay. She received Advair 100/50 diskus for her asthma, but had no asthma exacerbation during her stay in rehab.  The patient had Dopplers performed on July 23, 2001, which demonstrated no evidence of DVT or superficial thrombus or Bakers cyst.  After her blood transfusion her hemoglobin did improve, but she stayed on Trinsicon p.o. b.i.d.  Besides occasional constipation, there were no other medical complications or issues during her stay in rehabilitation.  Latest labs indicated that her hemoglobin was 10.4, hematocrit was 29.9, white blood cell count 8.6, platelet count 236.  She had one out of three positive hemoccult stools.  Her sodium was 137, potassium 4.0, glucose 90, AST 26, ALT 21, TSH 7.59.  Urine culture was performed on July 23, 2001, and came out multiple species 90,000 colonies, no identified pathogen.  At the time of discharge, her blood pressure was 136/66, her temperature was 97.1, respiratory rate 20, pulse 70, and CBGs were running 107, 101, 117, to 90.  Staples were removed prior to discharge and surgical incisions demonstrate no signs of infection.  Steri-Strips were intact.  PT report indicated that the  patient was ambulating 150 feet with rolling walker.  She is weightbearing at transfers.  She can transfer sit to stand modified independently and she can perform most ADLs modified independently.  She was discharged home with her son.  DISCHARGE MEDICATIONS: 1. Coumadin 5 mg one tablet daily in the p.m. through August 19, 2001. 2. Advair 100/50 one spray in the a.m. 3. Synthroid 50 mcg daily. 4. Actos 30 mg daily. 5. Aspirin 81 mg to hold for now until she completes her dose of Coumadin. 6. Iron 325 mg daily. 7. Elavil 25 mg as needed at night. 8. Oxycontin 10 mg every 12 hours x 7 days. 9. Oxycodone 5 mg one to two tablets every four to six hours as needed    for pain.  DISCHARGE INSTRUCTIONS:  She is to observe knee precautions, use knee immobilizer, use walker.  No driving and no drinking.  No concentrated sweets. Check sugars at least twice a day.  She will have [facility] for physical therapy and occupational therapy. They are to monitor her pro time and INR.  The first draw will be August 02, 2001, and she is to follow up with Loreta Ave, M.D. in one to two weeks and follow up with Reuel Boom L. Thomasena Edis, M.D. as needed and follow up with Minerva Areola L. August Saucer, M.D. in four to six weeks. Dictated by:   Lucita Lora, M.D. Attending Physician:  Herold Harms DD:  07/30/01 TD:  07/31/01 Job: [id number]

## 2011-02-18 NOTE — Cardiovascular Report (Signed)
NAME:  [name], [name]                      ACCOUNT NO.:  [id number]   MEDICAL RECORD NO.:  [id number]                   PATIENT TYPE:  INP   LOCATION:  2031                                 FACILITY:  [facility]   PHYSICIAN:  Robynn Pane, M.D.               DATE OF BIRTH:  [birth year]   DATE OF PROCEDURE:  04/26/2002  DATE OF DISCHARGE:  04/26/2002                              CARDIAC CATHETERIZATION   PROCEDURE:  Left cardiac catheterization and selective left and right  coronary angiography, LV graphy of the right groin using Judkins technique.   INDICATIONS FOR PROCEDURE:  [name] is a 70 year old black female with a  past medical history significant for non insulin dependent diabetes  mellitus, history of SVT, hypothyroidism, personal history of polymyalgia  rheumatica with rhabdomyolysis, history of syncope many years ago, recently  discharged from the hospital.  She came to the ER complaining of feeling  weak, dizzy, light headed.  The patient denies any chest pain, nausea,  vomiting, diaphoresis.  She checked her blood sugar and blood pressure which  were within normal range.   But, in the ER she was noted to be orthostatic. The patient denied any  exertional chest pain but complains of exertional dyspnea with minimal  exertion associated with feeling weak.  She denies any PND orthopnea, leg  swelling.  She also complains of chronic stuffiness of the sinuses.  Denies  any fever or chills.  Due to exertional dyspnea and feeling weak and tied,  the patient underwent Persantine Cardiolite on 04/24/2002 which showed  possible apical and distal anterior wall ischemia with ejection fraction of  65%.   Due to exertional dyspnea, probably angina equivalent and positive  Persantine Cardiolite, the patient and her son were discussed regarding  various options of treatment, ie., medical versus invasive, left cath,  possible PTCA and stenting and stress, ie., stroke, need for  emergency CABG,  risks of restenosis, local vascular complications, etc and consented for  PTCA.   DESCRIPTION OF PROCEDURE:  After obtaining the informed consent, the patient  was brought to the Cath Lab and was placed on the fluoroscopy table.  The  right groin was prepped and draped in the usual sterile fashion.  Xylocaine  2% was used for local anesthesia and draped.  __________ a thin walled 6  French arterial sheath was placed.  The sheath was aspirated and flushed.  Next, a __________ catheter was advanced over the wire under fluoroscopic  guidance at the ascending aorta.  The wire was pulled out, the catheter was  aspirated and connected to the manifold.   The catheter was further advanced and engaged into the left coronary ostium.  Multiple views of the left system were taken.  Next, the catheter was  disengaged and was pulled out over the wire and was replaced with a 6 French  right Judkins catheter which was advanced over the wire under fluoroscopic  guidance up to the ascending aorta.  The wire was pulled out.  The catheter  was aspirated and connected to the manifold. The catheter was further  advanced and engaged into the right coronary ostium.   Multiple views of the right system were taken.  Next, the catheter was  disengaged and was pulled out over the wire and was replaced with a 6 French  pigtail catheter which was advanced over the guidewire under fluoroscopic  guidance up to the ascending aorta.  The catheter was further advanced  across the aortic valve into the LV.  LV pressures were recorded.  Next, AV  graft was done in 30 degree RAO position.  Post angiographic pressures were  recorded from LV and then pull back pressures were recorded from the aorta.  There was no gradient across the aortic valve.   The pigtail catheter was pulled out.  The sheaths were pulled out over the  wire and the sheaths were aspirated and flushed.   FINDINGS:  The patient has good  LV systolic function.  The left main was  patent.  The__________ was down to 15% proximal stenosis.  Diagonal one was  very, very small which was patent.  Diagonal two was small which was patent,  diagonal three was very small which was patent.  Left circumflex was patent,  OM-1 was patent, OM-2 was patent, RCA was patent.  Arteriotomy was closed  without any complications.  The patient tolerated the procedure well.  The  patient was transferred to the recovery room in stable condition.                                               Robynn Pane, M.D.    MNH/MEDQ  D:  04/26/2002  T:  05/06/2002  Job:  [id number]   cc:   Minerva Areola L. August Saucer, M.D.

## 2011-02-18 NOTE — Discharge Summary (Signed)
[facility] [place]  Patient:    [name], [name] Visit Number: [id number] MRN: [id number]          Service Type: MED Location: 2000 2031 01 Attending Physician:  Gwenyth Bender Dictated by:   Eduardo Osier Sharyn Lull, M.D. Admit Date:  04/23/2002 Discharge Date: 04/26/2002   CC:         Gwenyth Bender, MD   Discharge Summary  ADMITTING DIAGNOSES: 1. Orthostatic hypotension. 2. History of spontaneous vaginal delivery. 3. Progressive weakness. 4. Non-insulin-dependent diabetes mellitus. 5. History of asthma. 6. History of polymyalgia rheumatica. 7. Degenerative joint disease. 8. Colonic dysfunction.  FINAL DIAGNOSES: 1.  Status post orthostatic hypotension. 2.  Mild coronary artery disease. 3.  Mildly positive Persantine Cardiolite. 4.  Status post left cardiac catheterization. 5.  History of spontaneous vaginal delivery. 6.  Non-insulin-dependent diabetes mellitus presently controlled by diet. 7.     Hypothyroidism. 8.  Morbid obesity. 9.  Chronic leg pain. 10. Questionable history of polymyalgia rheumatica. 11. Questionable chronic sinusitis improved. 12. Degenerative joint disease. 13. Colonic dysfunction. 14. History of bronchial asthma.  DISCHARGE HOME MEDICATIONS: 1. Toprol-XL 50 mg one tablet daily. 2. Baby aspirin 81 mg one tablet daily. 3. Neurontin 300 mg one capsule three times per day as before. 4. Synthroid 0.05 mg one tablet daily. 5. Clarinex 5 mg one tablet daily as needed. 6. Tequin 400 mg one tablet daily for seven more days.  ACTIVITY:  Avoid heavy lifting, pushing or pulling for 48 hours.  DIET:  Low-salt, low-cholesterol 1800 calories ADA diet.  The patient has been advised to monitor blood sugar daily and chart.  Post cardiac catheterization instructions and Perclose instructions have been given.  Follow up with me in one week and Dr. August Saucer in two weeks.  Admission at two weeks or as scheduled.  CONDITION UPON DISCHARGE:   Stable.  BRIEF HISTORY AND HOSPITAL COURSE:  The patient is a 70 year old black female with past medical history significant for non-insulin-dependent diabetes mellitus, history of SVD, hypothyroidism, questionable history of polymyalgia rheumatica, history of syncope many years ago, degenerative joint disease, who was recently discharged from the hospital.  She came to the ER complaining of feeling weak, dizzy, and lightheaded.  Denies any chest pain, nausea, vomiting, and diaphoresis.  The patient was noted to be orthostatic.  The patient denies any exertional chest pain but complains of exertional dyspnea with minimal exertion, associated with feeling weak.  The patient denies any PND, orthopnea, leg swelling, complaints of chronic stuffiness or sinuses. Denies any fever or chills.  PAST MEDICAL HISTORY:  As above.  PAST SURGICAL HISTORY:  She has left total knee replacement in 10/02.  ALLERGIES:  She has intolerance to codeine, Diovan, and Darvocet.  MEDICATIONS AT HOME: 1.  Neurontin 300 mg t.i.d. 2.  Bextra 20 mg p.o. q.d. 3.  MS Contin 30 mg q. 12h. 4.  Digoxin 0.125 mg p.o. q.d. 5.  Lopressor 25 mg p.o. b.i.d. 6.  Meclizine 12.5 mg p.o. t.i.d. 7.  Flexeril 10 mg t.i.d. 8.  Amaryl 4 mg p.o. q.d. 9.  Lasix 20 mg p.o. q.d. 10. Feosol. 11. Clarinex 5 mg q.d. 12. Baby aspirin 81 mg p.o. q.d. 13. Synthroid 0.05 mg p.o. q.d.  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  FAMILY HISTORY:  Her father died of massive MI at the age of 74, mother died of MI at the age of 69, one brother died of CA of throat, and one sister died of fallopian tube cancer.  PHYSICAL EXAMINATION:  GENERAL:  She is alert, awake, oriented x3, in no acute distress.  VITAL SIGNS:  Blood pressure is 130/80, pulse is 104, and  sinus tachycardia.  NECK:  Supple.  No JVD or bruits.  LUNGS:  Clear to auscultation without rhonchi or rales.  There is mild left maxillary sinus tenderness.  There was no erythema.  CARDIOVASCULAR EXAM:  S1 and S2 are normal.  There were soft systolic murmurs. There was no S3 gallop.  ABDOMEN:  Soft, bowel sounds are present, and nontender.  EXTREMITIES:  There is no clubbing, cyanosis or edema.  LABORATORY DATA:  EKG showed normal sinus rhythm with no acute ischemic changes.  The patient underwent Persantine Cardiolite on April 25, 2002, which showed probable ischemia of the apex and distal anterolateral wall, normal wall motion study, EF of 65%.  CT of the sinuses showed no evidence of sinusitis or other abnormalities.  Digoxin level was 0.7.  Urinalysis was essentially negative.  Cholesterol was 164.  HDL was low at 34, LDL of 621.  A cortisol level was 2.3, two sets of CPK, MB, and troponin I was negative. Hemoglobin was 12.6, hematocrit 38.3, white count of 10.3, sodium was 137, potassium 4.6, chloride 98, bicarbonate 30, glucose 99, BUN 14, creatinine 1.3, and magnesium was 1.9.  TSH was still pending.  ACTH was 14, which was in normal range.  BRIEF HOSPITAL COURSE:  The patient was admitted to telemetry unit.  The patient did not have any further episodes of dizziness, weakness or orthostatic hypotension.  Due to history of exertional dyspnea and weakness, the patient underwent the Persantine Cardiolite on April 25, 2002, which showed probable ischemia in the apical and anterolateral wall.  Subsequently, the patient underwent left cardiac catheterization today as per catheterization report, which suggested mild coronary artery disease.  The patients post procedure groin was stable.  There was no evidence of hematoma or bruise.  The patient will be discharged home this evening on the above medications and  will be followed up by me in one week and Dr. August Saucer in two weeks or  as scheduled.  The patient also has been advised to wean off MS Contin.  The patient has few tablets left at home.  She will take one tablet daily and slowly wean it off. Dictated by:   Eduardo Osier Sharyn Lull, M.D. Attending Physician:  Gwenyth Bender DD:  04/26/02 TD:  04/30/02 Job: [id number]

## 2011-02-18 NOTE — H&P (Signed)
NAME:  [name], [name]                      ACCOUNT NO.:  [id number]   MEDICAL RECORD NO.:  [id number]                   PATIENT TYPE:  INP   LOCATION:  3041                                 FACILITY:  [facility]   PHYSICIAN:  Eric L. August Saucer, M.D.                  DATE OF BIRTH:  04-29-1941   DATE OF ADMISSION:  12/11/2003  DATE OF DISCHARGE:                                HISTORY & PHYSICAL   CHIEF COMPLAINT:  Increasing shortness of breath with cough and wheezing.   HISTORY OF PRESENT ILLNESS:  This is the first recent [facility]  admission for this 70 year old divorced black female with long-standing  hypertension and diabetes mellitus who was feeling well until approximately  two weeks ago.  She noted at that time increasing cough with initially some  post-nasal drainage.  The patient used her inhalers at home.  She also used  her allergy medications.  Despite this, she continued to have significant  coughing and wheezing.  Over the past few days she has had some more  shortness of breath with activity.  Today she became extremely short of  breath with tightness in her chest.  The patient walked into the office  _________for an elevation.  She was noted to have significant asthma at that  time and is subsequently admitted for further evaluation.  The patient  denies fever or night sweats.  Appetite has been down.  She has had some  lightheadedness without true vertigo.   PAST MEDICAL HISTORY:  1. Long-standing hypertension.  2. History of supraventricular tachycardia.  3. Polymyalgia rheumatica.  4. She has been treated in the past for degenerative joint disease of her     knees.  5. Diabetes mellitus for which she has been controlled with oral medication.  6. Intermittent supraventricular tachycardia.  7. She has had orthostatic hypotension in the past when she was admitted in     July 2003.  8. She did undergo a cardiac catheterization per Dr. Sharyn Lull and was found  to have mild coronary artery disease.  Good left ventricular function at     that time.  9. The patient has a history of right knee and back pain with activity.  10.      Status post left knee replacement in 2002.   ALLERGIES:  1. The patient tolerates CODEINE poorly.  2. VALIUM.  3. DARVON.  4. DARVOCET.  5. OXYCONTIN.  6. She has poorly tolerated DURAGESIC PATCH as well with dizziness.   HABITS:  The patient does not drink.  She had previously smoked and quit in  1997.   PRESENT MEDICATIONS:  1. Toprol XL 50 mg daily.  2. Aspirin 81 mg daily.  3. Cyclobenzaprine 10 mg t.i.d.  4. Zyrtec 10 mg daily.  5. Ferrous gluconate 324 mg t.i.d.  6. Lipitor 20 mg daily.  7. Fosamax 70 mg every week.  8. Tylenol Extra  Strength p.r.n.  9. Afrin nasal spray 2 puffs q.d. to b.i.d.  10.      Atrovent 2 puffs q.a.m. and p.m.  11.      Synthroid 50 mcg daily.  12.      Ultracet two p.o. daily.  13.      Amaryl 1 mg p.o. daily.   SOCIAL HISTORY:  The patient is divorced.  Presently lives alone.  She has  been disabled for approximately four years.  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   FAMILY HISTORY:  Father died of a massive MI at age 35.  Mother died of MI  at age 79.  One brother died of cancer of the throat.  One sister died of  cancer of the fallopian tube.   PHYSICAL EXAMINATION:  GENERAL:  She is a well-developed, well-nourished,  weak-appearing black female, presently in no acute distress following a  nebulizer treatment.  VITAL SIGNS:  Height 5 feet 3.5 inches, weight 219 pounds.  Blood pressure  was 110/72, respiratory rate of 20, pulse rate of 98.  HEENT:  Head normocephalic, atraumatic without bruits.  Extraocular  movements were intact.  She had mild frontal sinus tenderness.  No turbinate  edema.  TMs with diffuse light reflex bilaterally.  NECK:  Supple.  No posterior cervical adenopathy.  LUNGS:  Distant breath sounds.   She has faint expiratory wheezes on forced  exhalation.  Scattered E-to-A changes at bases.  CARDIOVASCULAR:  Normal S1 and S2, no S3 or S4.  No murmurs or rubs  appreciated.  ABDOMEN:  Bowel sounds present, no epigastric tenderness.  EXTREMITIES:  Trace edema of the lower extremities.  Negative Homan's.  Decreased range of motion of the knees.  NEUROLOGIC:  Groggy, but alert and oriented x3.  Cranial nerves are intact.  Cerebellar and sensory motor functions were intact.   LABORATORY DATA:  WBCs 9500, hemoglobin of 12.3, hematocrit of 37.1.  Chemistries:  Sodium 138, potassium 3.9, chloride 103, CO2 28, BUN 13,  creatinine 1.2, glucose of 103.  SGOT and SGPT within normal limits.  Electrolytes notable for calcium being normal.  Other laboratory data as  noted above.  Chest x-ray pending at this time.   IMPRESSION:  1. Asthmatic bronchitis.  She has had a cough associated with some yellowish     phlegm as well as asthma.  2. History of supraventricular tachycardia.  She notably has tolerated     albuterol and Serevent type inhalers poorly.  3. Diabetes mellitus.  This may be currently exacerbated during treatment of     her asthma.  4. History of hypertension.  5. History of orthostatic hypotension.  6. Allergic rhinitis.  7. Polymyalgia rheumatica.  8. Mild coronary artery disease.  9. Hypothyroidism.   PLAN:  The patient is admitted for further treatment at this time.  A chest  x-ray is still pending.  In view of her significant respiratory impairment,  I will give her Solu-Medrol and close followup of her sugars.  We will start  her on Xopenex and Atrovent and attempt to avoid any albuterol and Serevent-  like agents which has caused tachycardia in the patient in the past.  We  will continue other regimen as before.  Further adjustments pending  response.  Eric L. August Saucer, M.D.    ELD/MEDQ  D:  12/11/2003  T:  12/13/2003  Job:   [id number]

## 2011-02-18 NOTE — Discharge Summary (Signed)
[name], [name]            ACCOUNT NO.:  [id number]   MEDICAL RECORD NO.:  [id number]          PATIENT TYPE:  INP   LOCATION:  1409                         [facility]AC[facility]  [facility]   PHYSICIAN:  Eric L. August Saucer, M.D.     DATE OF BIRTH:  11-26-1940   DATE OF ADMISSION:  02/05/2006  DATE OF DISCHARGE:  02/14/2006                                 DISCHARGE SUMMARY   FINAL DIAGNOSES:  1.  Left lower lobe pneumonia.  2.  Acute asthmatic bronchitis.  3.  Diabetes mellitus.  4.  Sleep apnea.  5.  Chest pain, non cardiac.  6.  Hypothyroidism.  7.  Hyperlipidemia.  8.  Degenerative joint disease.  9.  Fibromyalgia.  10. Polymyalgia rheumatica.   OPERATIONS/PROCEDURES:  1.  Stress Myoview study on Feb 11, 2006.   HISTORY OF PRESENT ILLNESS:  This was a first recent [facility]  admission for this 70 year old divorced black female with a long-standing  history of asthma, hypertension and diabetes mellitus who was doing well  until approximately 4 days prior to admission.  At that time she developed  an upper respiratory infection after being exposed to a friend who had  similar symptoms.  She thereafter noted increasing sinus congestion with  cough and post nasal drainage. She noted occasional wheezing as well. There  was no chest pain at that time.  The patient subsequently came to the office  the following day and was noted to have an upper respiratory infection.  She  was given Zithromax as well as Xopenex inhaler with Mucinex.  The patient  however continued to have increasing congestion 1 night prior to admission  and developed left-sided chest pain.  Because of her symptoms the patient  was subsequently admitted for further evaluation.   PAST MEDICAL HISTORY/PHYSI CAN EXAM:  Is per admission H&P.   HOSPITAL COURSE:  The patient was admitted for further treatment of left-  sided chest pain with increasing cough and shortness of breath. Her clinical  picture was most  suggestive of an acute respiratory infection with asthmatic  bronchitis as well.  Chest pain was somewhat atypical.  EKG showed some  sinus tachycardia, otherwise without acute changes.  The chest x-ray which  was pending at the time of admission subsequently returned positive for a  left lower lobe pneumonia.  The patient was continued on intravenous  antibiotics. She was placed on IV Solu-Medrol. Bronchodilator therapy as  well.  With a history of sleep apnea, she was continued on a CPAP machine at  night as well.   Over the subsequent days the patient made a very slow improvement.  She  initially started with a peak flow of 205 at best. This only gradually  increased over the subsequent days. She had not been using a CPAP machine  consistently at home as it was learned.  She however did use this here in  the hospital which she tolerated well.  The patient was also placed on  incentive spirometry on a q.2 hour basis.   With intensive pulmonary therapy patient made steady progress.  She did  require some adjustments of her insulin regimen with the Solu-Medrol.   Notably while hospitalized, she did experience chest pain which was very  suggestive of anginal symptoms. She was placed on anticoagulation with IV  nitroglycerin.  Cardiac enzymes were obtained q.8 hours x3 which were  negative for acute injury.  She was seen in consultation by Dr. Sharyn Lull.  The patient subsequently underwent a stress-Myoview study which essentially  showed no evidence of significant coronary artery disease. She was noted to  have a good ejection fraction as well.   The patient continued to be evaluated for non cardiac pain.  Notably her  bronchitis symptoms improved with antibiotic therapy and bronchodilators.  She gradually became more ambulatory over the subsequent days.   By Feb 13, 2006 she was feeling considerably better. She still had a cough  which was productive of some yellowish to green phlegm but  no further chest  pains.  She was ambulating more in her room.  Follow up chest x-ray  demonstrated subsegmental atelectasis at that point with obvious evidence  for improvement.   The patient was subsequently felt to be stable for discharge.  Antibiotics  were switched from Rocephin IV to Vantin. She was monitored for 18 to 24  hours. She had no fever.  She was subsequently discharged home much  improved.   DISCHARGE MEDICATIONS:  1.  Vantin 200 mg daily.  2.  Amantidine 100 mg b.i.d.  3.  Mucinex 600 mg b.i.d.  4.  Imdur 30 mg q.a.m.  5.  She will continue on her Toprol XL 50 mg daily.  6.  Lipitor 20 mg daily.  7.  Tramadol 50 mg x4 daily p.r.n.  8.  Synthroid 50 mcg q.a.m.  9.  Plaquenil 200 mg daily.  10. Flexeril 10 mg p.o. t.i.d.  11. Nexium 40 mg q.a.m.  12. Aspirin 81 mg daily.  13. Amaryl 2 mg p.o. q.a.m.  14. Azmacort inhaler 2 puffs x4 daily.  15. Xopenex 2 puffs x4 daily.   The patient will be maintained on a 4 gram sodium/no concentrated sweets  diet. She has been encouraged to use her incentive spirometer every 2 hours  while awake.  She is to follow up in the office in 2 week's time.           ______________________________  Lind Guest August Saucer, M.D.     ELD/MEDQ  D:  03/15/2006  T:  03/15/2006  Job:  [id number]
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
## 2011-02-18 NOTE — Discharge Summary (Signed)
NAME:  [name], BOESEN                      ACCOUNT NO.:  [id number]   MEDICAL RECORD NO.:  [id number]                   PATIENT TYPE:  INP   LOCATION:  3041                                 FACILITY:  [facility]   PHYSICIAN:  Eric L. August Saucer, M.D.                  DATE OF BIRTH:  11/28/1940   DATE OF ADMISSION:  12/11/2003  DATE OF DISCHARGE:  12/18/2003                                 DISCHARGE SUMMARY   FINAL DIAGNOSES:  1. Unspecified asthma with acute exacerbation, 493.92.  2. Diabetes mellitus type 2, non-insulin dependent, 250.00.  3. Cardiac dysrhythmia, 427.90.  4. Polymyalgia rheumatica, 725.   OPERATION/PROCEDURE:  None.   HISTORY OF PRESENT ILLNESS:  This is the first recent [facility]  admission for this 70 year old divorced black female with longstanding  hypertension and diabetes mellitus who was feeling well until approximately  two weeks prior to admission. At that time she noted increasing cough with  some mild postnasal drainage. The patient used her inhalers at home. She  also used her allergy medications. The fact that she continued to have  significant coughing and wheezing over the past few days prior to admission,  she had notably more shortness of breath with activity. On the day of  admission she became extremely short of breath with tightness in her chest.  The patient was subsequently admitted for further evaluation and treatment  of exacerbation of asthma.   For past medical history and physical exam see admission history and  physical.   HOSPITAL COURSE:  The patient was admitted for further treatment of her  presenting symptoms. She is felt to have changes consistent with an acute  bronchitis. She has a history also of some supraventricular tachycardia. She  has a history of diabetes mellitus, though her blood sugar at the time of  admission was 103. The patient was admitted for further acute treatment. Due  to the significant respiratory  impairment she was started on Solu-Medrol.  She was also placed on Xopenex and Atrovent with intensive pulmonary  treatment as well. The patient was placed on a flutter valve as well as  incentive spirometry. Notably her peak flows were poor initially, but did  gradually improve. Over subsequent days the patient made a slow, but steady  improvement. As her incentive spirometry improved her resting tachycardia  also did improve. Notably a BNP was obtained, which was found to be less  than 30, which was felt to be not consistent with congestive heart failure  as a component to her symptoms. She did have an elevated CRP at 19.8. She  has been known to have a polymyalgia rheumatica.   With the use of flutter valve as well as intensive pulmonary treatment the  patient made steady improvement with peak flows.   Because of the patient's infections, which had been felt to be chronic she  did undergo a spiral CT scan to  rule out PE. This was subsequently found to  be negative. By March 14 she was making steady progress. Three of the peak  flows were 300s or better. She was subsequently tapered down on her IV Solu-  Medrol and switched over to p.o. prednisone. The patient continued to make  improvements thereafter. She did experience increasing arthralgias after  coming off of her Solu-Medrol. Her prednisone taper dose was gradually  adjusted. With improvement of the pulmonary symptoms the patient became more  ambulatory. She did require sliding scale insulin for her blood sugars with  mild fluctuations as a result of steroid therapy. By December 18, 2003 she was  feeling much better. Her peak flow was 350. She denies chest pain. She did  have some cough, which was not productive. The patient, however, was more  ambulatory. She was subsequently felt to be stable for discharge. Notably a  chest x-ray did demonstrate some subsegmental atelectasis at the right base.  This will be followed upon as an  outpatient.   The patient was subsequently discharged home improved.   DISCHARGE MEDICATIONS:  1. Toprol XL 50 mg daily.  2. Aspirin 81 mg daily.  3. Synthroid 50 mcg daily.  4. Amaryl 1 mg daily.  5. Atrovent 2 puffs b.i.d.  6. Nexium 40 mg daily.  7. Avapro 150 mg daily.  8. Mucinex DM 1 b.i.d.  9. Flovent 110 mcg 2 puffs b.i.d.  10.      Xopenex 0.63 mg/3 cc q.i.d. via hand-held nebulizer.  11.      She will be on a prednisone 10 mg Dosepak with taper.  12.      She has been given a sliding scale with Humalog insulin for CBGs     greater than 350, 12 units; 280-348, 9 units; 210-279, 6 units; 140-209,     3 units.   DISCHARGE FOLLOW UP:  She will be seen in the office in two weeks time for  follow-up.                                                Eric L. August Saucer, M.D.    ELD/MEDQ  D:  01/21/2004  T:  01/23/2004  Job:  [id number]

## 2011-02-18 NOTE — Op Note (Signed)
[place]. [facility]  Patient:    [name], [name] Visit Number: [id number] MRN: [id number]          Service Type: SUR Location: [id number] Attending Physician:  Colbert Ewing Dictated by:   Loreta Ave, M.D. Proc. Date: 07/19/01 Admit Date:  07/19/2001                             Operative Report  PREOPERATIVE DIAGNOSES: 1. End-stage degenerative arthritis, left knee. 2. Varus alignment and flexion contracture.  POSTOPERATIVE DIAGNOSES: 1. End-stage degenerative arthritis, left knee. 2. Varus alignment and flexion contracture.  PROCEDURE:  Left total knee replacement utilizing [employer]onics prosthesis. Cemented #5 posterior-stabilizing femoral component.  Cemented #5 tibial component with 12 mm polyethylene insert.  Cemented, recessed, nommetal-backed 26 mm patellar component.  Appropriate soft tissue balancing, including lateral release.  DESCRIPTION OF PROCEDURE:  The patient was brought to the operating room and placed on the operating table in supine position.  After adequate anesthesia had been obtained, left knee examined.  A 5 degree flexion contracture, alignment in 7 degrees of varus, correctable to almost neutral.  Ligaments stable.  Flexion to 90 degrees.  Tourniquet applied, prepped and draped in the usual sterile fashion.  Exsanguinated with elevation and Esmarch, tourniquet inflated to 375 mmHg.  Straight incision above the patella down to the tibial tubercle.  Skin and subcutaneous tissue divided, hemostasis obtained with electrocautery.  A medial parapatellar arthrotomy exposing the knee, with grade 4 changes throughout.  Periarticular spurs, contracted remnant of cruciate ligaments, remnants of menisci all excised.  Medial soft tissue release to obtain a balanced knee at completion.  Distal femur exposed. Intramedullary guide placed.  Distal cut at 5 degrees of valgus, removing 12 mm off the distal femur.  Sized to a  #5 component.  Jigs put in place, definitive cuts made for the posterior-fit large component.  Trial put in place and found to fit well.  Trial removed.  Tibia exposed with appropriate retractors.  Tibial spine removed with a saw.  Sized for a #5 component. Intramedullary guide placed.  Proximal cut 5 degree posterior slope, removing 6 mm off the deficient medial side.  All periarticular spurs removed.  Trials put in place with a #5 on the tibia and femur.  With a 12 mm insert, I had a nicely-balanced knee set at 5 degrees of valgus, easy full extension, full flexion without component lift-off.  Tibia was marked for rotation and then hand-reamed for the cruciate portion of the tibial component.  The patella was then sized, reamed, and drilled for the 26 mm component with extensive periarticular spurs removed.  All trials put in place.  Still lateral tracking.  A lateral release performed with cautery to balance the patellofemoral joint, with good tracking at completion.  All components removed.  Copious irrigation with the pulse irrigating device.  All surrounding spurs and loose bodies, especially from the posterior recess ,removed.  Cement prepared, placed on the tibial component, which was hammered into place with excessive cement removed.  Polyethylene attached.  Cement placed on the femoral component, which was also hammered into place, and excessive cement removed.  The knee reduced.  Good stability, tracking, and alignment.  Cement placed on patellar component, which was compressed in place and excessive cement removed.  All components held in good position until the cement hardened.  Clamps removed.  Knee re-examined.  Full extension, full flexion,  good stability, good alignment, and good patellofemoral tracking. Wound irrigated.  Hemovacs placed, brought out through separate stab wounds. Arthrotomy closed with #1 Vicryl, skin and subcutaneous tissue with Vicryl and staples.   Margins of the wound and the knee injected with Marcaine and the Hemovacs clamped.  Sterile compressive dressing applied.  Tourniquet deflated and removed.  Knee immobilizer applied.  The anesthesia reversed.  Brought to the recovery room.  Tolerated the surgery well with no complications. Dictated by:   Loreta Ave, M.D. Attending Physician:  Colbert Ewing DD:  07/19/01 TD:  07/20/01 Job: [id number]

## 2011-02-18 NOTE — Discharge Summary (Signed)
[place]. [facility]  Patient:    OMNIA, DOLLINGER Visit Number: [id number] MRN: [id number]          Service Type: [facility] Location: [id number] Attending Physician:  Herold Harms Dictated by:   Oris Drone Petrarca, P.A.-C. Admit Date:  07/23/2001 Discharge Date: 07/30/2001                             Discharge Summary  ADMISSION DIAGNOSIS:  Advanced degenerative joint disease of the left knee.  DISCHARGE DIAGNOSES: 1. Advanced degenerative joint disease of the left knee. 2. Postoperative hemorrhagic anemia. 3. History of asthma. 4. Diabetes mellitus. 5. Hypertension. 6. Exogenous obesity.  PROCEDURE:  Left total knee replacement.  HISTORY OF PRESENT ILLNESS:  The patient is a 70 year old female status post bilateral arthroscopic debridements both knees in March 2000.  Had grade IV changes at that time.  Has failed conservative treatment.  Indicated now for left total knee replacement.  HOSPITAL COURSE:  The patient is a 70 year old female admitted on 07/19/01. After appropriate laboratory studies were obtained as well as 1 g of Ancef IV on call to the operating room, was taken to the operating room where she underwent left total knee replacement.  She was placed with a PCA pump postoperatively, and a femoral nerve block.  Continued on Ancef 1 g IV q.8h. x 3 doses.  Heparin 5000 units subcutaneously q.12h. until her Coumadin became therapeutic was instituted.  Foley was placed intraoperatively, discontinued two days postoperatively.  Hemovacs were placed intraoperatively.  CPM 0 to 30 degrees for eight hours per day.  Consultations with physical therapy, occupational therapy, and rehabilitation.  Physical therapy for ambulation weightbearing as tolerated on the left.  Initial Dilaudid pump was discontinued, and changed to morphine on July 20, 2001.  This was discontinued also on July 20, 2001, because of adverse reactions, and she was  placed on Percocet and OxyContin.  Regular medications were continued. She was typed and crossed for 2 units of packed cells on July 22, 2001, and given those units.  Once it was felt that she was stable, she was transferred to rehabilitation on July 23, 2001, where she will continue with her physical therapy and occupational therapy.  EKG of 06/26/01, showed sinus tachycardia, otherwise normal.  Chest x-ray of 06/29/01, showed no evidence of active chest disease.  LABORATORY DATA:  On 07/17/01, revealed a hemoglobin of 11.3, hematocrit 33.7%, white count 7000, platelets 256,000.  Of 07/22/01, hemoglobin 7.9, hematocrit 22.9%, white count 7000, platelets 191,000.  Chemistries of 07/17/01, showed a sodium of 136, potassium 4.2, chloride 106, CO2 24, glucose 112, BUN 23, creatinine 1.2, calcium 9.1, total protein 6.9, albumin 3.7, AST 22, ALT 19, ALP 98, total bilirubin 0.6.  Of 07/22/01, sodium 139, potassium 4.0, chloride 102, CO2 30, glucose 105, creatinine 1.1, BUN 7, calcium 8.9. Urinalysis was benign for a voided urine with 0 to 2 reds and 0 to 2 whites. She was blood type B+, antibody screen negative.  Two units of packed cells were given during her hospital course.  DISCHARGE INSTRUCTIONS:  She was transferred to rehabilitation where she will continue with her physical therapy and occupational therapy as per protocol.  CONDITION ON DISCHARGE:  Improved. Dictated by:   Oris Drone Petrarca, P.A.-C. Attending Physician:  Herold Harms DD:  08/02/01 TD:  08/03/01 Job: [id number]

## 2011-02-18 NOTE — H&P (Signed)
NAMEDANYLLE, OUK            ACCOUNT NO.:  [id number]   MEDICAL RECORD NO.:  [id number]          PATIENT TYPE:  INP   LOCATION:  1409                         FACILITY:  [facility]   PHYSICIAN:  Eric L. August Saucer, M.D.     DATE OF BIRTH:  1941/10/03   DATE OF ADMISSION:  02/05/2006  DATE OF DISCHARGE:                                HISTORY & PHYSICAL   CHIEF COMPLAINT:  Increasing shortness of breath, cough with chest pain.   HISTORY OF PRESENT ILLNESS:  First recent [facility] admission for  this 70 year old divorced black female with longstanding history of asthma,  hypertension and diabetes mellitus who was doing well until approximately  four days prior to admission.  At that time, she was exposed to a friend who  had developed an upper respiratory tract infection.  The subsequent day she  noted increasing sinus congestion with cough and postnasal drainage.  She  noted some occasional wheezing as well.  There was no chest pain at that  time.  The patient subsequently came to the office the following day.  She  was noted to have upper respiratory tract infection at that time.  She was  started on Zithromax as well as Xopenex inhaler and Mucinex.  The patient  noted as she continued to have increasing congestion last night she  developed left-sided chest pain.  She noted a sharp pain which was not  pleuritic.  She also noted dullness on the left chest that traveled up into  her shoulder.  It did not travel into her arm.  She denies diaphoresis.  There was no increased palpitations or flutters.  No lightheadedness or  increasing weakness.  Today, her symptoms persisted.  She subsequently  admitted at this time for further evaluation.   PAST MEDICAL HISTORY:  1.  Notable for longstanding asthma with a history of bronchitis in the      past.  2.  Diabetes mellitus type 2.  3.  History of cardiac dysrhythmias with chest pain in the past.  She did      undergo cardiac  catheterization per Dr. Eduardo Osier. Harwani in 2003.  She      was noted to have a 15% lesion in the LAD with good LV function.  4.  The patient has polymyalgia rheumatic for which she is followed by Dr.      Corliss Skains.  5.  Distant history of tobacco abuse which she stopped 12 years ago.  6.  Otherwise notable for longstanding hypertension as noted.  7.  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  She is      status post right knee replacement with symptomatic changes in the left      knee.   ALLERGIES:  The patient is allergic to VALIUM, DARVOCET, OXYCONTIN.  She has  poorly tolerated CODEINE as well DURAGESIC PATCH with excessive nausea and  dizziness.   HABITS:  As noted above. She did stop smoking in 1997.   PRESENT MEDICATIONS:  1.  Zithromax Dosepak.  2.  Toprol XL 50 mg daily.  3.  Lipitor  20 mg q.a.m.  4.  Tramadol 50 mg q.i.d. which she has been apparently taking 2 b.i.d.  5.  Synthroid 50 mcg q.a.m.  6.  Plaquenil 200 mg p.o. daily.  7.  Flexeril 10 mg p.o. q.a.m. and t.i.d.  8.  Nexium 40 mg q.a.m.  9.  Amaryl 2 mg p.o. q.a.m.  10. Aspirin 81 mg daily.  11. Benadryl 25 mg p.o. daily.  12. Mucinex DM 600 mg b.i.d.  13. Azmacort 2 puffs q.i.d.  14. Xopenex inhaler 2 puffs q.4-6h.   REVIEW OF SYSTEMS:  Otherwise as noted above.  He has had an intermittent  left foot pain.  No hematemesis, melena or hematochezia.   SOCIAL HISTORY:  The patient is divorced.  She lives alone.  She has two  grown children alive and well.  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   FAMILY HISTORY:  Notable for father dying of massive MI at age 50.  Mother  died of MI at 72.  She had one brother that died of cancer of the throat.  One sister died of cancer of the fallopian tube.  There is a strong family  history of diabetes mellitus as well as asthma and obesity.   PHYSICAL EXAMINATION:  GENERAL:  She is a well-developed, well-nourished  black female in mild distress at this  time.  VITAL SIGNS:  Blood pressure is 130/84, respiratory rate 20.  Temperature  pending.  HEENT:  Head is normocephalic and atraumatic without bruits.  Extraocular  movements intact.  She has left maxillary sinus tenderness.  No sclerae  icterus.  TM with decreased light reflex left versus right.  Throat without  ulcer or erythema.  NECK:  Supple.  No enlarged thyroid.  No posterior cervical nodes.  LUNGS:  Notable for bilateral expiratory wheezes.  No vocal fremitus  bilaterally.  No rub appreciated.  CARDIOVASCULAR:  Normal S1 and S2.  No S3, S4.  A 1/6 systolic ejection  murmur lower left sternal border.  ABDOMEN:  Bowel sounds are present.  No enlarged liver, spleen, masses or  tenderness.  MUSCULOSKELETAL:  She has minimal tenderness in the knees at this time.  Negative Homans'.  No edema.  Minimal tenderness at the surface of the left  foot.  Chronic tenderness in the deltoid muscles bilaterally.  Minimal  tenderness of the lower lumbar sacral spine.  EXTREMITIES:  Without edema.  SKIN:  Without active lesions.  NEUROLOGICAL:  Nonfocal.  Otherwise intact.   LABORATORY DATA:  CBC revealed a WBC of 8000, hemoglobin 13.4, hematocrit of  39.4, platelets of 317,000.  Presently with normal differential.  Chemistry  shows a sodium of 138, potassium of 4.2, chloride 107, CO2 28, BUN of 14,  creatinine 1.3, glucose of 85.  SGOT and SGPT within normal limits.  Albumin  3.7.  Cardiac enzymes show CK 34, MB 0.6, troponin 0.1.  Calcium 9.3.  Chest  x-ray pending.  EKG notable for a sinus tachycardia with a rate of 105.  Normal axis.  No acute changes appreciated.   IMPRESSION:  1.  Acute upper respiratory tract infection.  2.  Bronchial asthma secondary to #1.  3.  Bronchitis viral versus infectious.  4.  Chest pain, atypical in nature:  We will need to exclude angina.  Her      last catheterization in 2003.  Unlikely to have severe progression     during that time.  5.  Diabetes  mellitus.  6.  Polymyalgia rheumatica.  7.  Rule out occult sinusitis.  8.  History of hypertension.   PLAN:  The patient is admitted at this time for further intensive treatment.  Chest x-ray is pending.  We will obtain cardiac enzymes q.8h. x3.  We will  treat the patient's asthma aggressively with Xopenex nebulizer and empiric  antibiotics.  We will give her one dose of Solu-Medrol as well.  Continue  the pulmonary toilet as indicated.  Follow up on the progress over the next  24 hours.  Further evaluation pending response to the above.           ______________________________  Lind Guest. August Saucer, M.D.     ELD/MEDQ  D:  02/05/2006  T:  02/05/2006  Job:  [id number]

## 2011-02-18 NOTE — Op Note (Signed)
[place]  Patient:    [name], [name]                   MRN: [id number] Proc. Date: 02/22/00 Adm. Date:  [id number] Disc. Date: [id number] Attending:  Rich Brave CC:         Lind Guest. August Saucer, M.D.                           Operative Report  PROCEDURE:  Colonoscopy.  INDICATIONS:  A 70 year old female with distant family history of colon cancer, for colon cancer screening.  ENDOSCOPIST:  Florencia Reasons, M.D.  FINDINGS:  Normal exam to the cecum except rare left-sided diverticulum.  DESCRIPTION OF PROCEDURE:  The nature, purpose and risks of the procedure were familiar to the patient from prior examination.  She provided written consent. Sedation with fentanyl 50 mcg and Versed 3 mg IV without arrhythmias or desaturation.  The Olympus adjustable tension pediatric video colonoscope was advanced easily to the cecum as identified by clear visualization of the appendiceal orifice, and pullback was then performed.  The quality of the prep was excellent, and it was felt that all areas were well seen.  There were a couple of left-sided diverticula, but this was otherwise a normal examination without evidence of polyps, cancer, colitis or vascular malformations.  Retroflexion of the rectum was normal.  No biopsies were obtained.  The patient tolerated the procedure well, and there were no apparent complications.  IMPRESSION: 1. Minimal diverticulosis. 2. Distant family history of colon cancer without any neoplastic lesions found    on current exam.  PLAN:  Consider followup colonoscopy in 5-10 years. DD:  02/22/00 TD:  02/27/00 Job: [id number]

## 2011-02-18 NOTE — Discharge Summary (Signed)
NAME:  [name], [name]                      ACCOUNT NO.:  [id number]   MEDICAL RECORD NO.:  [id number]                   PATIENT TYPE:  INP   LOCATION:  2031                                 FACILITY:  [facility]   PHYSICIAN:  Robynn Pane, M.D.               DATE OF BIRTH:  November 09, 1940   DATE OF ADMISSION:  04/23/2002  DATE OF DISCHARGE:  04/26/2002                                 DISCHARGE SUMMARY   ADMISSION DIAGNOSES:  1. Orthostatic hypotension.  2. History of supraventricular tachycardia.  3. Progressive weakness.  4. Non insulin-dependent diabetes mellitus.  5. History of asthma.  6. History of polymyalgia rheumatica.  7. Degenerative joint disease.  8. Colonic dysfunction.   FINAL DIAGNOSES:  1. Status post orthostatic hypotension.  2. Mild coronary artery disease.  3. Mildly positive Persantine-Cardiolite.  4. Status post left cardiac catheterization.  5. History of supraventricular tachycardia.  6. Non insulin-dependent diabetes mellitus; presently controlled by diet.  7. Hypothyroidism.  8. Morbid obesity.  9. Chronic leg pain.  10.      Questionable history of polymyalgia rheumatica.  11.      Questionable chronic sinusitis; improved.  12.      Degenerative joint disease.  13.      Colonic dysfunction.  14.      History of bronchial asthma.   DISCHARGE HOME MEDICATIONS:  1. Toprol XL 50 mg one tablet daily.  2. Baby aspirin 81 mg one tablet daily.  3. Neurontin 300 mg one capsule three times per day as before.  4. Synthroid 0.05 mg one tablet daily.  5. Clarinex 5 mg one tablet daily as needed.  6. Tequin 400 mg one tablet daily for 7 more days.   ACTIVITY:  Avoid heavy lifting, pushing, or pulling for 48 hours.   DIET:  Low salt, low cholesterol, 1800 calorie ADA diet.  The patient has  been advised to monitor blood sugar daily and chart.   DISCHARGE INSTRUCTIONS:  Postcardiac catheterization instructions and  Perclose instructions have been given.   FOLLOW UP:  Follow up with me in 1 week and Dr. August Saucer in 2 weeks or as  scheduled.   CONDITION AT DISCHARGE:  Stable.   BRIEF HISTORY AND HOSPITAL COURSE:  The patient is a 70 year old black  female with past medical history significant for non insulin-dependent  diabetes mellitus, history of SVT, hypothyroidism, questionable history of  polymyalgia rheumatica, history of syncope many years ago, degenerative  joint disease was recently discharged from the hospital.  She came to the ER  complaining of feeling weak, dizzy, light-headed; denies any chest pain,  nausea, vomiting, diaphoresis.  The patient was noted to be orthostatic.  The patient denies any exertional chest pain but complains of exertional  dyspnea with minimal exertion associated with feeling weak.  The patient  denies any PND, orthopnea, leg swelling; complains of chronic stuffiness of  sinuses and denies any fever or  chills.   PAST MEDICAL HISTORY:  As above.   PAST SURGICAL HISTORY:  She had left total knee replacement in October 2002.   ALLERGIES:  She has intolerance to CODEINE, DARVON and DARVOCET.   MEDICATIONS AT HOME:  She was on multiple medications:  1. Neurontin 300 mg t.i.d.  2. __________ 20 mg p.o. q.d.  3. MS Contin 30 mg q.12h.  4. Digoxin 0.125 mg p.o. q.d.  5. Lopressor 25 mg p.o. b.i.d.  6. Meclizine 12.5 mg p.o. t.i.d.  7. Flexeril 10 mg t.i.d.  8. Amaryl 4 mg p.o. q.d.  9. Lasix 20 mg p.o. q.d.  10.      Feosol.  11.      Clarinex 5 mg q.d.  12.      Baby aspirin 81 mg p.o. q.d.  13.      Synthroid 0.05 mg p.o. q.d.   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   FAMILY HISTORY:  Father died of massive MI at the age of 84.  Mother died of  MI  at the age of 47.  One brother died of CA of throat.  One sister died of   fallopian tube cancer.   PHYSICAL EXAMINATION:  GENERAL:  She was alert, awake, oriented x3 in no  acute distress.  VITAL SIGNS:  Blood pressure was 130/80, pulse of 104, sinus tachycardia.  NECK:  Neck was supple; no JVD, no bruit.  LUNGS:  Lungs were clear to auscultation without rhonchi or rales.  HEENT:  There was mild left maxillary sinus tenderness.  There was no  erythema.  CARDIOVASCULAR:  S1, S2 was normal.  There was soft systolic murmur.  There  was no S3 gallop.  ABDOMEN:  Abdomen was soft, bowel sounds were present, nontender.  EXTREMITIES:  There was no clubbing, cyanosis, or edema.   LABORATORIES:  EKG showed normal sinus rhythm with no acute ischemic  changes.  The patient underwent Persantine-Cardiolite on April 25, 2002 which  showed probable ischemia of the apex and distal anterolateral wall, normal  wall motion study, EF of 65%.  CT of the sinuses showed no evidence of  sinusitis or other abnormalities.  Her digoxin level was 0.7.  Urinalysis  was essentially negative.  Her cholesterol was 164, HDL was low at 34, LDL  of 116.  Her cortisol level was 2.3.  Her two sets of CPK-MB and troponin I  were negative.  Hemoglobin was 12.6, hematocrit 38.3, white count of 10.3.  Sodium was 137, potassium 4.6, chloride 98, bicarb 30, glucose 99, BUN 14,  kreatinine 1.3.  Magnesium was 1.9.  TSH is still pending.  __________  was  14 which was in normal range.   BRIEF HISTORY AND HOSPITAL COURSE:  The patient was admitted to the  telemetry unit.  The patient did not have any further episodes of dizziness,  weakness, or orthostatic hypotension.  Due to her history of exertional  dyspnea and weakness, the patient underwent Persantine-Cardiolite on April 25, 2002 which showed probable ischemia in the apical and anterolateral wall  substernally.  The patient underwent left cardiac catheterization today as per cath report which suggested mild coronary artery disease.  The patient's   postprocedure volume is stable.  There is no evidence of hematoma or bruit.  The patient will  be discharged home this evening on the above medications  and will be followed up by me in 1 week and Dr. August Saucer in 2 weeks or as  scheduled.  The patient also has been advised to wean off MS Contin.  The  patient has few tablets left at home.  She will take one tablet daily and  slowly wean it off.                                                  Robynn Pane, M.D.    MNH/MEDQ  D:  04/26/2002  T:  05/01/2002  Job:  [id number]   cc:   Minerva Areola L. August Saucer, M.D.

## 2011-02-18 NOTE — Discharge Summary (Signed)
[place]. [facility]  Patient:    [name], [name] Visit Number: [id number] MRN: [id number]          Service Type: MED Location: 2000 2031 01 Attending Physician:  Gwenyth Bender Dictated by:   Eduardo Osier Sharyn Lull, M.D. Admit Date:  04/23/2002 Discharge Date: 04/26/2002   CC:         Gwenyth Bender, MD   Discharge Summary  ADMITTING DIAGNOSES: 1. Orthostatic hypotension. 2. History of spontaneous vaginal delivery. 3. Progressive weakness. 4. Non-insulin-dependent diabetes mellitus. 5. History of asthma. 6. History of polymyalgia rheumatica. 7. Degenerative joint disease. 8. Colonic dysfunction.  FINAL DIAGNOSES: 1.  Status post orthostatic hypotension. 2.  Mild coronary artery disease. 3.  Mildly positive Persantine Cardiolite. 4.  Status post left cardiac catheterization. 5.  History of spontaneous vaginal delivery. 6.  Non-insulin-dependent diabetes mellitus presently controlled by diet. 7. 7.  Hypothyroidism. 8.  Morbid obesity. 9.  Chronic leg pain. 10. Questionable history of polymyalgia rheumatica. 11. Questionable chronic sinusitis improved. 12. Degenerative joint disease. 13. Colonic dysfunction. 14. History of bronchial asthma.  DISCHARGE HOME MEDICATIONS: 1. Toprol-XL 50 mg one tablet daily. 2. Baby aspirin 81 mg one tablet daily. 3. Neurontin 300 mg one capsule three times per day as before. 4. Synthroid 0.05 mg one tablet daily. 5. Clarinex 5 mg one tablet daily as needed. 6. Tequin 400 mg one tablet daily for seven more days.  ACTIVITY:  Avoid heavy lifting, pushing or pulling for 48 hours.  DIET:  Low-salt, low-cholesterol 1800 calories ADA diet.  The patient has been advised to monitor blood sugar daily and chart.  Post cardiac catheterization instructions and Perclose instructions have been given.  Follow up with me in one week and Dr. August Saucer in two weeks.  Admission at two weeks or as scheduled.  CONDITION UPON DISCHARGE:   Stable.  BRIEF HISTORY AND HOSPITAL COURSE:  The patient is a 70 year old black female with past medical history significant for non-insulin-dependent diabetes mellitus, history of SVD, hypothyroidism, questionable history of polymyalgia rheumatica, history of syncope many years ago, degenerative joint disease, who was recently discharged from the hospital.  She came to the ER complaining of feeling weak, dizzy, and lightheaded.  Denies any chest pain, nausea, vomiting, and diaphoresis.  The patient was noted to be orthostatic.  The patient denies any exertional chest pain but complains of exertional dyspnea with minimal exertion, associated with feeling weak.  The patient denies any PND, orthopnea, leg swelling, complaints of chronic stuffiness or sinuses. Denies any fever or chills.  PAST MEDICAL HISTORY:  As above.  PAST SURGICAL HISTORY:  She has left total knee replacement in 10/02.  ALLERGIES:  She has intolerance to codeine, Diovan, and Darvocet.  MEDICATIONS AT HOME:  She was on multiple medications - 1.  Neurontin 300 mg t.i.d. 2.  Bextra 20 mg p.o. q.d. 3.  MS Contin 30 mg q. 12h. 4.  Digoxin 0.125 mg p.o. q.d. 5.  Lopressor 25 mg p.o. b.i.d. 6.  Meclizine 12.5 mg p.o. t.i.d. 7.  Flexeril 10 mg t.i.d. 8.  Amaryl 4 mg p.o. q.d. 9.  Lasix 20 mg p.o. q.d. 10. Feosol. 11. Clarinex 5 mg q.d. 12. Baby aspirin 81 mg p.o. q.d. 13. Synthroid 0.05 mg p.o. q.d.  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  FAMILY HISTORY:  Her father died of massive MI at the age of 65, mother died of MI at the age of 60, one brother died of CA of throat, and one sister died of fallopian tube cancer.  PHYSICAL EXAMINATION:  GENERAL:  She is alert, awake, oriented x3, in no acute distress.  VITAL SIGNS:  Blood  pressure is 130/80, pulse is 104, and sinus tachycardia.  NECK:  Supple.  No JVD or bruits.  LUNGS:  Clear to auscultation without rhonchi or rales.  There is mild left maxillary sinus tenderness.  There was no erythema.  CARDIOVASCULAR EXAM:  S1 and S2 are normal.  There were soft systolic murmurs. There was no S3 gallop.  ABDOMEN:  Soft, bowel sounds are present, and nontender.  EXTREMITIES:  There is no clubbing, cyanosis or edema.  LABORATORY DATA:  EKG showed normal sinus rhythm with no acute ischemic changes.  The patient underwent Persantine Cardiolite on April 25, 2002, which showed probable ischemia of the apex and distal anterolateral wall, normal wall motion study, EF of 65%.  CT of the sinuses showed no evidence of sinusitis or other abnormalities.  Digoxin level was 0.7.  Urinalysis was essentially negative.  Cholesterol was 164.  HDL was low at 34, LDL of 562.  A cortisol level was 2.3, two sets of CPK, MB, and troponin I was negative. Hemoglobin was 12.6, hematocrit 38.3, white count of 10.3, sodium was 137, potassium 4.6, chloride 98, bicarbonate 30, glucose 99, BUN 14, creatinine 1.3, and magnesium was 1.9.  TSH was still pending.  ACTH was 14, which was in normal range.  BRIEF HOSPITAL COURSE:  The patient was admitted to telemetry unit.  The patient did not have any further episodes of dizziness, weakness or orthostatic hypotension.  Due to history of exertional dyspnea and weakness, the patient underwent the Persantine Cardiolite on April 25, 2002, which showed probable ischemia in the apical and anterolateral wall.  Subsequently, the patient underwent left cardiac catheterization today as per catheterization report, which suggested mild coronary artery disease.  The patients post procedure groin was stable.  There was no evidence of hematoma or bruise.  The patient will be discharged home this evening on the above medications and  will be followed up by me in  one week and Dr. August Saucer in two weeks or as scheduled.  The patient also has been advised to wean off MS Contin.  The patient has few tablets left at home.  She will take one tablet daily and slowly wean it off. Dictated by:   Eduardo Osier Sharyn Lull, M.D. Attending Physician:  Gwenyth Bender DD:  04/26/02 TD:  04/30/02 Job: [id number]

## 2011-02-18 NOTE — Discharge Summary (Signed)
NAME:  [name], CLARE NO.:  [id number]   MEDICAL RECORD NO.:  [id number]                   PATIENT TYPE:  INP   LOCATION:                                       FACILITY:  [facility]   PHYSICIAN:  Eric L. August Saucer, M.D.                  DATE OF BIRTH:  02-06-1941   DATE OF ADMISSION:  04/09/2002  DATE OF DISCHARGE:  04/20/2002                                 DISCHARGE SUMMARY   FINAL DIAGNOSES:  1. Myalgia and myositis, 729.1.  2. Protein calorie malnutrition, 263.9.  3. Venous insufficiency, 459.81.  4. Edema, 782.3.  5. Cardiac dysrhythmia, 427.89.  6. Debility, 799.3.  7. Diabetes mellitus type 2, non-insulin dependent, 250.00.  8. Obesity, 278.00.  9. Osteoarthrosis of the left leg, 715.36.  10.      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   OPERATIONS/PROCEDURES:  None.   HISTORY OF PRESENT ILLNESS:  The patient is a 70 year old divorced black  female with longstanding obesity, degenerative joint disease and diabetes  mellitus who was admitted with increasing lower extremities edema of one  week's duration. She had increasing recent arthralgias with marked fatigue  as well.  Patient had been lying in bed nearly continuously over the past  two weeks.  She denied experiencing pain in both legs, with marked edema  which progresses during the day. She noted calf pain with ambulating as  well.  The patient was admitted for further evaluation.   PAST MEDICAL HISTORY:  Positive for question of fibromyalgia versus  polymyalgia rheumatica. She is status post left knee arthroplasty, status  post steroid injections in the lower back x3, right knee x2 over the past 6  months. Diabetes mellitus, SVT and hyperthyroidism.  Otherwise as noted per  H&P.   PHYSICAL EXAMINATION:  Otherwise as noted per H&P.   HOSPITAL COURSE:  The patient was admitted for further evaluation of  increasing lower extremities edema with leg pains suggestive of DVT versus  phlebitis.   Also, at the time of admission, she was noted to be hypokalemic,  as well as with low protein stores suggestive of protein caloric  malnutrition.  The patient was initially placed at bed rest.  Her potassium  was replenished prior to the institution of a diuretic. In view of her  protein stores being low, she was also given IV albumin as well. Venous  Dopplers were obtained, which showed no evidence for DVT.  The patient was  treated clinically for mild phlebitis with conservative measures as well.   Over the subsequent days, the patient was continued on  IV albumin with  pushing toward further nutritional improvement.  Her prealbumin was low,  suggestive of protein caloric malnutrition.  Because of the issues of lower  extremities edema and possible failure, she did undergo a 2-D echo which  showed overall good left ventricular function.  Despite this, she had  ongoing pain  issues. This was managed with several medical regimens, with  finally the institution of MS-Contin which did control her pain.   The patient was however, while hospitalized evaluated further for  arthralgias.  CPK enzymes were negative. She did have an elevated C-reactive  protein as well as markedly elevated sedimentation rate. It was felt this  picture was most consistent with a polymyalgia rheumatica.   Also while hospitalized, she was noted to have a mild sinus tachycardia. The  patient was seen in consultation by Dr. Sharyn Lull as well.  With normal LV  functions, her digoxin medication was stopped and she was started on a low  dose of beta-blocker.  Over the ensuing days, with gradual progression of  activity, her tachycardia did improve.   With continued supportive measures, the patient made steady improvement.  It  was felt that she had a significant problem with chronic debility. She was  seen in consultation by the rehabilitation team.  It was felt that she did  not meet the criteria for in-patient  therapy.   Continued supportive measures and with increasing ambulation, the patient  made considerable progress.  By April 19, 2002, she was feeling much better.  She did have transient wooziness associated with her MS-Contin dose being  increased. This was adjusted further and she felt better.  Subsequently, she  was felt to be stable for discharge on April 20, 2002.   DISCHARGE MEDICATIONS:  1. Synthroid 50 mcg daily.  2. Clarinex 5 mg daily.  3. Flexeril 5 mg t.i.d.  4. Amaryl 2 mg q.a.m.  5. Neurontin 300 mg t.i.d.  6. Lasix 20 mg p.o. q.a.m.  7. Lopressor 50 mg 1/2 tablet b.i.d.  8. Ferrous gluconate 300 mg b.i.d.  9. Bextra 5 mg p.o. q.a.m.  10.      Patient will be maintained on MS-Contin 30 mg b.i.d.   DIET:  She will be maintained on a 4 g sodium, 1200 ADA diet.   FOLLOW UP:  She will be seen in the office in 2 weeks' time for followup.  She will be also be followed by [facility] as well.                                                Eric L. August Saucer, M.D.    ELD/MEDQ  D:  01/07/2004  T:  01/08/2004  Job:  [id number]

## 2011-05-13 ENCOUNTER — Other Ambulatory Visit: Payer: Self-pay | Admitting: Internal Medicine

## 2011-05-13 ENCOUNTER — Ambulatory Visit
Admission: RE | Admit: 2011-05-13 | Discharge: 2011-05-13 | Disposition: A | Discharge status: Home / Self Care | Payer: Medicare Other | Source: Ambulatory Visit | Attending: Internal Medicine | Admitting: Internal Medicine

## 2011-05-13 DIAGNOSIS — M79674 Pain in right toe(s): Secondary | ICD-10-CM

## 2011-05-16 ENCOUNTER — Encounter (INDEPENDENT_AMBULATORY_CARE_PROVIDER_SITE_OTHER): Payer: Medicare Other

## 2011-05-16 DIAGNOSIS — M79609 Pain in unspecified limb: Secondary | ICD-10-CM

## 2011-05-27 NOTE — Procedures (Unsigned)
DUPLEX DEEP VENOUS EXAM - LOWER EXTREMITY  INDICATION:  Chronic pain in bilateral lower extremities.  HISTORY:  Edema:  Intermittent. Trauma/Surgery:  No. Pain:  Yes. PE:  No. Previous DVT:  Unknown. Anticoagulants:  No. Other:  DUPLEX EXAM:               CFV   SFV   PopV  PTV    GSV               R  L  R  L  R  L  R   L  R  L Thrombosis    o  o  o  o  o  o  o   o  o  o Spontaneous   +  +  +  +  +  +  +   +  +  + Phasic        +  +  +  +  +  +  +   +  +  + Augmentation  +  +  +  +  +  +  +   +  +  + Compressible  +  +  +  +  +  +  +   +  +  + Competent     +  +  +  +  +  +  +   +  +  +  Legend:  + - yes  o - no  p - partial  D - decreased  IMPRESSION:  No evidence of deep venous thrombosis or superficial venous thrombus in the bilateral lower extremities.   _____________________________ Di Kindle. Edilia Bo, M.D.  LT/MEDQ  D:  05/16/2011  T:  05/16/2011  Job:  [id number]

## 2011-06-10 ENCOUNTER — Other Ambulatory Visit: Payer: Self-pay | Admitting: Internal Medicine

## 2011-06-10 DIAGNOSIS — Z1231 Encounter for screening mammogram for malignant neoplasm of breast: Secondary | ICD-10-CM

## 2011-06-17 ENCOUNTER — Ambulatory Visit
Admission: RE | Admit: 2011-06-17 | Discharge: 2011-06-17 | Disposition: A | Discharge status: Home / Self Care | Payer: Medicare Other | Source: Ambulatory Visit | Attending: Internal Medicine | Admitting: Internal Medicine

## 2011-06-17 DIAGNOSIS — Z1231 Encounter for screening mammogram for malignant neoplasm of breast: Secondary | ICD-10-CM

## 2011-07-15 LAB — COMPREHENSIVE METABOLIC PANEL WITH GFR
ALT: 17
ALT: 26
ALT: 32
ALT: 33
AST: 17
AST: 32
AST: 34
AST: 40 — ABNORMAL HIGH
Albumin: 3 — ABNORMAL LOW
Albumin: 3 — ABNORMAL LOW
Albumin: 3.1 — ABNORMAL LOW
Albumin: 3.1 — ABNORMAL LOW
Alkaline Phosphatase: 84
Alkaline Phosphatase: 93
Alkaline Phosphatase: 95
Alkaline Phosphatase: 95
BUN: 23
BUN: 24 — ABNORMAL HIGH
BUN: 24 — ABNORMAL HIGH
BUN: 25 — ABNORMAL HIGH
CO2: 24
CO2: 25
CO2: 25
CO2: 26
Calcium: 8.2 — ABNORMAL LOW
Calcium: 8.3 — ABNORMAL LOW
Calcium: 8.3 — ABNORMAL LOW
Calcium: 8.4
Chloride: 105
Chloride: 107
Chloride: 108
Chloride: 109
Creatinine, Ser: 1.01
Creatinine, Ser: 1.05
Creatinine, Ser: 1.16
Creatinine, Ser: 1.18
GFR calc non Af Amer: 46 — ABNORMAL LOW
GFR calc non Af Amer: 46 — ABNORMAL LOW
GFR calc non Af Amer: 52 — ABNORMAL LOW
GFR calc non Af Amer: 55 — ABNORMAL LOW
Glucose, Bld: 149 — ABNORMAL HIGH
Glucose, Bld: 205 — ABNORMAL HIGH
Glucose, Bld: 251 — ABNORMAL HIGH
Glucose, Bld: 274 — ABNORMAL HIGH
Potassium: 3.5
Potassium: 3.6
Potassium: 4.1
Potassium: 4.4
Sodium: 137
Sodium: 138
Sodium: 138
Sodium: 140
Total Bilirubin: 0.3
Total Bilirubin: 0.4
Total Bilirubin: 0.5
Total Bilirubin: 0.9
Total Protein: 5.8 — ABNORMAL LOW
Total Protein: 5.9 — ABNORMAL LOW
Total Protein: 6.4
Total Protein: 6.6

## 2011-07-15 LAB — CBC
HCT: 31.5 — ABNORMAL LOW
HCT: 31.7 — ABNORMAL LOW
HCT: 32.2 — ABNORMAL LOW
HCT: 34.2 — ABNORMAL LOW
Hemoglobin: 10.5 — ABNORMAL LOW
Hemoglobin: 10.6 — ABNORMAL LOW
Hemoglobin: 10.8 — ABNORMAL LOW
Hemoglobin: 11.5 — ABNORMAL LOW
Hemoglobin: 12.9
MCHC: 33.3
MCHC: 33.5
MCHC: 33.5
MCHC: 33.6
MCHC: 34.2
MCV: 85.2
MCV: 86.4
MCV: 87.2
MCV: 85.1
Platelets: 246
Platelets: 260
Platelets: 262
Platelets: 276
RBC: 3.64 — ABNORMAL LOW
RBC: 3.7 — ABNORMAL LOW
RBC: 4.01
RDW: 15 — ABNORMAL HIGH
RDW: 15.1 — ABNORMAL HIGH
RDW: 15.1 — ABNORMAL HIGH
RDW: 15.5 — ABNORMAL HIGH
RDW: 14.9 — ABNORMAL HIGH
WBC: 12.8 — ABNORMAL HIGH
WBC: 14 — ABNORMAL HIGH
WBC: 16.3 — ABNORMAL HIGH

## 2011-07-15 LAB — BASIC METABOLIC PANEL
BUN: 21
CO2: 25
Chloride: 106
Glucose, Bld: 254 — ABNORMAL HIGH
Potassium: 4.1

## 2011-07-15 LAB — CULTURE, BLOOD (ROUTINE X 2)
Culture: NO GROWTH
Culture: NO GROWTH

## 2011-07-15 LAB — URINE MICROSCOPIC-ADD ON

## 2011-07-15 LAB — TSH: TSH: 0.763

## 2011-07-15 LAB — URINALYSIS, ROUTINE W REFLEX MICROSCOPIC
Glucose, UA: NEGATIVE
Ketones, ur: NEGATIVE
Specific Gravity, Urine: 1.013
pH: 6

## 2011-07-15 LAB — DIFFERENTIAL
Basophils Absolute: 0
Basophils Relative: 0
Basophils Relative: 0
Eosinophils Absolute: 0
Eosinophils Relative: 0
Eosinophils Relative: 2
Lymphocytes Relative: 21
Lymphs Abs: 1.1
Monocytes Absolute: 0.6
Monocytes Relative: 3
Neutrophils Relative %: 92 — ABNORMAL HIGH
Neutrophils Relative %: 74

## 2011-07-15 LAB — I-STAT 8, (EC8 V) (CONVERTED LAB)
BUN: 10
Glucose, Bld: 109 — ABNORMAL HIGH
HCT: 37
Hemoglobin: 12.6
Operator id: 151321
Potassium: 3.7
Sodium: 140

## 2011-07-15 LAB — COMPREHENSIVE METABOLIC PANEL
ALT: 21
CO2: 27
Calcium: 9.9
Creatinine, Ser: 1.02
GFR calc Af Amer: >60
GFR calc non Af Amer: 54 — ABNORMAL LOW
Glucose, Bld: 147 — ABNORMAL HIGH
Sodium: 140
Total Protein: 8.7 — ABNORMAL HIGH

## 2011-07-15 LAB — D-DIMER, QUANTITATIVE: D-Dimer, Quant: 1.58 — ABNORMAL HIGH

## 2011-07-15 LAB — POCT CARDIAC MARKERS: Operator id: 151321

## 2011-07-15 LAB — APTT: aPTT: 32

## 2011-07-15 LAB — URINE CULTURE

## 2011-07-15 LAB — HEMOGLOBIN A1C: Mean Plasma Glucose: 136

## 2011-07-15 LAB — PROTIME-INR
INR: 1
Prothrombin Time: 13.6

## 2011-07-15 LAB — LIPID PANEL
Cholesterol: 123
LDL Cholesterol: 67
Total CHOL/HDL Ratio: 2.4

## 2011-09-12 ENCOUNTER — Other Ambulatory Visit: Payer: Self-pay | Admitting: Internal Medicine

## 2011-10-10 ENCOUNTER — Other Ambulatory Visit: Payer: Self-pay | Admitting: Internal Medicine

## 2011-10-10 DIAGNOSIS — W19XXXA Unspecified fall, initial encounter: Secondary | ICD-10-CM

## 2011-10-12 ENCOUNTER — Ambulatory Visit
Admission: RE | Admit: 2011-10-12 | Discharge: 2011-10-12 | Disposition: A | Discharge status: Home / Self Care | Payer: Medicare Other | Source: Ambulatory Visit | Attending: Internal Medicine | Admitting: Internal Medicine

## 2011-10-12 DIAGNOSIS — W19XXXA Unspecified fall, initial encounter: Secondary | ICD-10-CM

## 2012-01-03 ENCOUNTER — Encounter (HOSPITAL_COMMUNITY): Payer: Self-pay | Admitting: *Deleted

## 2012-01-03 ENCOUNTER — Emergency Department (INDEPENDENT_AMBULATORY_CARE_PROVIDER_SITE_OTHER)
Admission: EM | Admit: 2012-01-03 | Discharge: 2012-01-03 | Disposition: A | Discharge status: Home / Self Care | Payer: Medicare Other | Source: Home / Self Care | Attending: Family Medicine | Admitting: Family Medicine

## 2012-01-03 ENCOUNTER — Emergency Department (INDEPENDENT_AMBULATORY_CARE_PROVIDER_SITE_OTHER): Payer: Medicare Other

## 2012-01-03 DIAGNOSIS — J449 Chronic obstructive pulmonary disease, unspecified: Secondary | ICD-10-CM

## 2012-01-03 DIAGNOSIS — R197 Diarrhea, unspecified: Secondary | ICD-10-CM

## 2012-01-03 HISTORY — DX: Bronchitis, not specified as acute or chronic: J40

## 2012-01-03 HISTORY — DX: Essential (primary) hypertension: I10

## 2012-01-03 HISTORY — DX: Chronic obstructive pulmonary disease, unspecified: J44.9

## 2012-01-03 LAB — POCT I-STAT, CHEM 8
BUN: 13 mg/dL (ref 6–23)
Calcium, Ion: 1.16 mmol/L (ref 1.12–1.32)
Chloride: 105 mEq/L (ref 96–112)
Creatinine, Ser: 1.3 mg/dL — ABNORMAL HIGH (ref 0.50–1.10)
Glucose, Bld: 122 mg/dL — ABNORMAL HIGH (ref 70–99)
TCO2: 27 mmol/L (ref 0–100)

## 2012-01-03 MED ORDER — ALBUTEROL SULFATE HFA 108 (90 BASE) MCG/ACT IN AERS: 1.0000 | INHALATION_SPRAY | Freq: Four times a day (QID) | RESPIRATORY_TRACT | Status: DC | PRN

## 2012-01-03 MED ORDER — ALBUTEROL SULFATE (5 MG/ML) 0.5% IN NEBU
INHALATION_SOLUTION | RESPIRATORY_TRACT | Status: AC
  Filled 2012-01-03: qty 1

## 2012-01-03 MED ORDER — ALBUTEROL SULFATE (5 MG/ML) 0.5% IN NEBU
5.0000 mg | INHALATION_SOLUTION | Freq: Once | RESPIRATORY_TRACT | Status: AC
  Administered 2012-01-03: 5 mg via RESPIRATORY_TRACT

## 2012-01-03 MED ORDER — IPRATROPIUM BROMIDE 0.02 % IN SOLN
0.5000 mg | Freq: Once | RESPIRATORY_TRACT | Status: AC
  Administered 2012-01-03: 0.5 mg via RESPIRATORY_TRACT

## 2012-01-03 NOTE — ED Notes (Signed)
3rd day of productive cough, sore throat and sinus congestion.  Denies fever at home.  Also 3 days of diarrhea--3 loose stools today.  She is eating  light and taking fluids well.

## 2012-01-03 NOTE — Discharge Instructions (Signed)
Clear liquid , bland diet tonight as tolerated, advance on wed as improved, use imodium for diarrhea as needed, see your doctor if any problems.

## 2012-01-03 NOTE — ED Provider Notes (Signed)
History     CSN: [id number]  Arrival date & time 01/03/12  1403   First MD Initiated Contact with Patient 01/03/12 1527      Chief Complaint  Patient presents with  . Asthma    (Consider location/radiation/quality/duration/timing/severity/associated sxs/prior treatment) Patient is a 71 y.o. female presenting with asthma. The history is provided by the patient.  Asthma This is a new problem. The current episode started more than 2 days ago. The problem has been gradually worsening. Associated symptoms include shortness of breath. Pertinent negatives include no chest pain. The symptoms are aggravated by coughing.    Past Medical History  Diagnosis Date  . COPD (chronic obstructive pulmonary disease)   . Bronchitis   . Diabetes mellitus   . Hypertension     Past Surgical History  Procedure Date  . Total knee arthroplasty     Family History  Problem Relation Age of Onset  . Diabetes Mother   . Heart failure Mother   . Cancer Mother   . Heart failure Father     History  Substance Use Topics  . Smoking status: Former Smoker    Quit date: 01/11/1996  . Smokeless tobacco: Not on file  . Alcohol Use: No    OB History    Grav Para Term Preterm Abortions TAB SAB Ect Mult Living                  Review of Systems  Constitutional: Positive for chills, activity change and appetite change. Negative for fever.  Respiratory: Positive for cough, shortness of breath and wheezing.   Cardiovascular: Negative for chest pain.  Gastrointestinal: Positive for diarrhea.    Allergies  Codeine; Propoxacet-n; and Valsartan  Home Medications   Current Outpatient Rx  Name Route Sig Dispense Refill  . DILTIAZEM HCL 100 MG IV SOLR Oral Take 120 mg by mouth daily.    Marland Kitchen METFORMIN HCL 500 MG PO TABS Oral Take 500 mg by mouth once.    Marland Kitchen METOPROLOL TARTRATE 25 MG PO TABS Oral Take 25 mg by mouth 2 (two) times daily.    Marland Kitchen SIMVASTATIN 20 MG PO TABS Oral Take 20 mg by mouth every  evening.    Marland Kitchen TIOTROPIUM BROMIDE MONOHYDRATE 18 MCG IN CAPS Inhalation Place 18 mcg into inhaler and inhale daily.    . TRAMADOL HCL 50 MG PO TABS Oral Take 50 mg by mouth every 6 (six) hours as needed.    . ALBUTEROL SULFATE HFA 108 (90 BASE) MCG/ACT IN AERS Inhalation Inhale 1-2 puffs into the lungs every 6 (six) hours as needed for wheezing. 1 Inhaler 0    BP 118/59  Pulse 116  Temp(Src) 99.9 F (37.7 C) (Oral)  Resp 14  SpO2 98%  Physical Exam  Nursing note and vitals reviewed. Constitutional: She is oriented to person, place, and time. She appears well-developed and well-nourished.  HENT:  Head: Normocephalic.  Right Ear: External ear normal.  Left Ear: External ear normal.  Mouth/Throat: Oropharynx is clear and moist.  Eyes: Pupils are equal, round, and reactive to light.  Neck: Normal range of motion. Neck supple.  Cardiovascular: Normal rate, regular rhythm, normal heart sounds and intact distal pulses.   Pulmonary/Chest: Effort normal. She has decreased breath sounds. She has wheezes. She has rhonchi.  Abdominal: Soft. Bowel sounds are normal. There is no tenderness.  Lymphadenopathy:    She has no cervical adenopathy.  Neurological: She is alert and oriented to person, place, and time.  Skin: Skin is warm and dry.    ED Course  Procedures (including critical care time)  Labs Reviewed  POCT I-STAT, CHEM 8 - Abnormal; Notable for the following:    Potassium 3.3 (*)    Creatinine, Ser 1.30 (*)    Glucose, Bld 122 (*)    All other components within normal limits   Dg Chest 2 View  01/03/2012  *RADIOLOGY REPORT*  Clinical Data: Shortness of breath, cough.  CHEST - 2 VIEW  Comparison: 10/01/2010  Findings: Mild hyperinflation of the lungs.  Heart is normal size. Linear scarring in the lingula.  No focal opacity on the right.  No effusions or acute bony abnormality.  Thoracolumbar scoliosis and degenerative changes.  IMPRESSION: Mild hyperinflation.  Lingular scarring.   Original Report Authenticated By: Cyndie Chime, M.D.     1. COPD with asthma   2. Acute diarrhea       MDM  X-rays reviewed and report per radiologist.         Linna Hoff, MD 01/03/12 1740

## 2012-01-06 ENCOUNTER — Emergency Department (HOSPITAL_COMMUNITY): Payer: Medicare Other

## 2012-01-06 ENCOUNTER — Encounter (HOSPITAL_COMMUNITY): Payer: Self-pay | Admitting: Family Medicine

## 2012-01-06 ENCOUNTER — Other Ambulatory Visit: Payer: Self-pay

## 2012-01-06 ENCOUNTER — Emergency Department (HOSPITAL_COMMUNITY)
Admission: EM | Admit: 2012-01-06 | Discharge: 2012-01-06 | Disposition: A | Discharge status: Home / Self Care | Payer: Medicare Other | Attending: Emergency Medicine | Admitting: Emergency Medicine

## 2012-01-06 DIAGNOSIS — I498 Other specified cardiac arrhythmias: Secondary | ICD-10-CM | POA: Insufficient documentation

## 2012-01-06 DIAGNOSIS — J40 Bronchitis, not specified as acute or chronic: Secondary | ICD-10-CM

## 2012-01-06 DIAGNOSIS — I10 Essential (primary) hypertension: Secondary | ICD-10-CM | POA: Insufficient documentation

## 2012-01-06 DIAGNOSIS — R0602 Shortness of breath: Secondary | ICD-10-CM | POA: Insufficient documentation

## 2012-01-06 DIAGNOSIS — J4489 Other specified chronic obstructive pulmonary disease: Secondary | ICD-10-CM | POA: Insufficient documentation

## 2012-01-06 DIAGNOSIS — E119 Type 2 diabetes mellitus without complications: Secondary | ICD-10-CM | POA: Insufficient documentation

## 2012-01-06 DIAGNOSIS — J449 Chronic obstructive pulmonary disease, unspecified: Secondary | ICD-10-CM | POA: Insufficient documentation

## 2012-01-06 LAB — COMPREHENSIVE METABOLIC PANEL
ALT: 12 U/L (ref 0–35)
Albumin: 3.5 g/dL (ref 3.5–5.2)
BUN: 10 mg/dL (ref 6–23)
Calcium: 9.6 mg/dL (ref 8.4–10.5)
GFR calc Af Amer: 68 mL/min — ABNORMAL LOW (ref >90)
Glucose, Bld: 117 mg/dL — ABNORMAL HIGH (ref 70–99)
Sodium: 139 mEq/L (ref 135–145)
Total Protein: 7.5 g/dL (ref 6.0–8.3)

## 2012-01-06 LAB — POCT I-STAT TROPONIN I: Troponin i, poc: 0 ng/mL (ref 0.00–0.08)

## 2012-01-06 LAB — CBC
MCH: 27.3 pg (ref 26.0–34.0)
MCHC: 32.8 g/dL (ref 30.0–36.0)
MCV: 83.1 fL (ref 78.0–100.0)
Platelets: 245 10*3/uL (ref 150–400)
RBC: 4.73 MIL/uL (ref 3.87–5.11)

## 2012-01-06 LAB — D-DIMER, QUANTITATIVE: D-Dimer, Quant: 2.88 ug/mL-FEU — ABNORMAL HIGH (ref 0.00–0.48)

## 2012-01-06 LAB — DIFFERENTIAL
Basophils Relative: 0 % (ref 0–1)
Eosinophils Absolute: 0 10*3/uL (ref 0.0–0.7)
Eosinophils Relative: 1 % (ref 0–5)
Lymphs Abs: 3.6 10*3/uL (ref 0.7–4.0)

## 2012-01-06 MED ORDER — METHOCARBAMOL 100 MG/ML IJ SOLN: 1000.0000 mg | Freq: Once | INTRAMUSCULAR | Status: DC

## 2012-01-06 MED ORDER — BENZONATATE 100 MG PO CAPS: 100.0000 mg | ORAL_CAPSULE | Freq: Three times a day (TID) | ORAL | Status: AC | PRN

## 2012-01-06 MED ORDER — SODIUM CHLORIDE 0.9 % IV BOLUS (SEPSIS)
500.0000 mL | Freq: Once | INTRAVENOUS | Status: AC
  Administered 2012-01-06: 500 mL via INTRAVENOUS

## 2012-01-06 MED ORDER — IOHEXOL 300 MG/ML  SOLN
100.0000 mL | Freq: Once | INTRAMUSCULAR | Status: AC | PRN
  Administered 2012-01-06: 100 mL via INTRAVENOUS

## 2012-01-06 MED ORDER — PREDNISONE 10 MG PO TABS: 20.0000 mg | ORAL_TABLET | Freq: Every day | ORAL | Status: DC

## 2012-01-06 MED ORDER — METHOCARBAMOL 500 MG PO TABS: 500.0000 mg | ORAL_TABLET | Freq: Two times a day (BID) | ORAL | Status: AC

## 2012-01-06 MED ORDER — IPRATROPIUM BROMIDE 0.02 % IN SOLN
0.5000 mg | Freq: Once | RESPIRATORY_TRACT | Status: AC
  Administered 2012-01-06: 0.5 mg via RESPIRATORY_TRACT
  Filled 2012-01-06: qty 2.5

## 2012-01-06 MED ORDER — METHOCARBAMOL 100 MG/ML IJ SOLN
1000.0000 mg | INTRAVENOUS | Status: AC
  Administered 2012-01-06: 1000 mg via INTRAVENOUS
  Filled 2012-01-06 (×2): qty 10

## 2012-01-06 MED ORDER — ALBUTEROL SULFATE (5 MG/ML) 0.5% IN NEBU
5.0000 mg | INHALATION_SOLUTION | Freq: Once | RESPIRATORY_TRACT | Status: AC
  Administered 2012-01-06: 5 mg via RESPIRATORY_TRACT
  Filled 2012-01-06: qty 1

## 2012-01-06 MED ORDER — AZITHROMYCIN 250 MG PO TABS: 250.0000 mg | ORAL_TABLET | Freq: Every day | ORAL | Status: AC

## 2012-01-06 MED ORDER — METHYLPREDNISOLONE SODIUM SUCC 125 MG IJ SOLR
125.0000 mg | Freq: Once | INTRAMUSCULAR | Status: AC
  Administered 2012-01-06: 125 mg via INTRAVENOUS
  Filled 2012-01-06: qty 2

## 2012-01-06 NOTE — ED Notes (Signed)
Pt reports having sob from asthma and bronchitis and cough x1 week. Reports she has not taken her meds in 3 days. Reports left and right sided chest pain starting 1 week ago too. NAD noted at this time.

## 2012-01-06 NOTE — ED Provider Notes (Signed)
History     CSN: [id number]  Arrival date & time 01/06/12  1422   First MD Initiated Contact with Patient 01/06/12 1505      Chief Complaint  Patient presents with  . Shortness of Breath  . Cough    (Consider location/radiation/quality/duration/timing/severity/associated sxs/prior treatment) HPI Pt has had wheezing, SOB, productive cough x 5 days. Pt states she thinks symptoms are improving. +chills, no fever. No LE swelling or pain. Mild chest tightness with deep inspiration. Pt states this is similar to her prev bronchitis/asthma episodes.  Past Medical History  Diagnosis Date  . COPD (chronic obstructive pulmonary disease)   . Bronchitis   . Diabetes mellitus   . Hypertension     Past Surgical History  Procedure Date  . Total knee arthroplasty     Family History  Problem Relation Age of Onset  . Diabetes Mother   . Heart failure Mother   . Cancer Mother   . Heart failure Father     History  Substance Use Topics  . Smoking status: Former Smoker    Quit date: 01/11/1996  . Smokeless tobacco: Not on file  . Alcohol Use: No    OB History    Grav Para Term Preterm Abortions TAB SAB Ect Mult Living                  Review of Systems  Constitutional: Positive for chills. Negative for fever and diaphoresis.  HENT: Negative for neck pain.   Respiratory: Positive for cough, chest tightness, shortness of breath and wheezing.   Cardiovascular: Negative for chest pain, palpitations and leg swelling.  Gastrointestinal: Negative for nausea, vomiting and abdominal pain.  Genitourinary: Negative for dysuria.  Musculoskeletal: Negative for back pain and joint swelling.  Skin: Negative for rash and wound.  Neurological: Negative for dizziness, weakness, numbness and headaches.    Allergies  Codeine; Oxycontin; Propoxacet-n; and Valsartan  Home Medications   Current Outpatient Rx  Name Route Sig Dispense Refill  . ALBUTEROL SULFATE HFA 108 (90 BASE) MCG/ACT IN  AERS Inhalation Inhale 1-2 puffs into the lungs every 6 (six) hours as needed for wheezing. 1 Inhaler 0  . DILTIAZEM HCL 100 MG IV SOLR Oral Take 120 mg by mouth daily.    Marland Kitchen METFORMIN HCL 500 MG PO TABS Oral Take 500 mg by mouth once.    Marland Kitchen METOPROLOL TARTRATE 25 MG PO TABS Oral Take 25 mg by mouth 2 (two) times daily.    . ADULT MULTIVITAMIN W/MINERALS CH Oral Take 1 tablet by mouth daily.    Marland Kitchen OMEPRAZOLE 20 MG PO CPDR Oral Take 20 mg by mouth daily.    Marland Kitchen POLYVINYL ALCOHOL 1.4 % OP SOLN Both Eyes Place 2 drops into both eyes as needed.    Marland Kitchen SIMVASTATIN 20 MG PO TABS Oral Take 20 mg by mouth every evening.    Marland Kitchen TIOTROPIUM BROMIDE MONOHYDRATE 18 MCG IN CAPS Inhalation Place 18 mcg into inhaler and inhale daily.    . TRAMADOL HCL 50 MG PO TABS Oral Take 50 mg by mouth every 6 (six) hours as needed.    . AZITHROMYCIN 250 MG PO TABS Oral Take 1 tablet (250 mg total) by mouth daily. Take first 2 tablets together, then 1 every day until finished. 6 tablet 0  . BENZONATATE 100 MG PO CAPS Oral Take 1 capsule (100 mg total) by mouth 3 (three) times daily as needed for cough. 20 capsule 0  . METHOCARBAMOL 500 MG PO  TABS Oral Take 1 tablet (500 mg total) by mouth 2 (two) times daily. 20 tablet 0  . PREDNISONE 10 MG PO TABS Oral Take 2 tablets (20 mg total) by mouth daily. 10 tablet 0    BP 138/67  Pulse 105  Temp(Src) 98.6 F (37 C) (Oral)  Resp 22  SpO2 93%  Physical Exam  Nursing note and vitals reviewed. Constitutional: She is oriented to person, place, and time. She appears well-developed and well-nourished. No distress.  HENT:  Head: Normocephalic and atraumatic.  Mouth/Throat: Oropharynx is clear and moist.  Eyes: EOM are normal. Pupils are equal, round, and reactive to light.  Neck: Normal range of motion. Neck supple.  Cardiovascular: Normal rate and regular rhythm.   Pulmonary/Chest: Effort normal. No respiratory distress. She has wheezes. She has rales.       Mild exp wheezing and  rhonchi at bl bases  Abdominal: Soft. Bowel sounds are normal. There is no tenderness. There is no rebound and no guarding.  Musculoskeletal: Normal range of motion. She exhibits no edema and no tenderness (no calf tenderness or swelling).  Neurological: She is alert and oriented to person, place, and time.  Skin: Skin is warm and dry. No rash noted. No erythema.  Psychiatric: She has a normal mood and affect. Her behavior is normal.    ED Course  Procedures (including critical care time)  Labs Reviewed  COMPREHENSIVE METABOLIC PANEL - Abnormal; Notable for the following:    Potassium 3.1 (*)    Glucose, Bld 117 (*)    GFR calc non Af Amer 58 (*)    GFR calc Af Amer 68 (*)    All other components within normal limits  D-DIMER, QUANTITATIVE - Abnormal; Notable for the following:    D-Dimer, Quant 2.88 (*)    All other components within normal limits  CBC  DIFFERENTIAL  POCT I-STAT TROPONIN I   Dg Chest 2 View  01/06/2012  *RADIOLOGY REPORT*  Clinical Data: Short of breath, cough, congestion, chest pain  CHEST - 2 VIEW  Comparison: Chest x-ray of 01/03/2012  Findings: No active infiltrate or effusion is seen.  There is some peribronchial thickening which may indicate bronchitis.  The heart is mildly enlarged and stable.  No acute bony abnormality is seen.  IMPRESSION: Mild cardiomegaly.  Peribronchial thickening may indicate bronchitis.  Original Report Authenticated By: Juline Patch, M.D.   Ct Angio Chest W/cm &/or Wo Cm  01/06/2012  *RADIOLOGY REPORT*  Clinical Data: Shortness of breath.  Cough.  Bronchitis.  Chest pain.  CT ANGIOGRAPHY CHEST  Technique:  Multidetector CT imaging of the chest using the standard protocol during bolus administration of intravenous contrast. Multiplanar reconstructed images including MIPs were obtained and reviewed to evaluate the vascular anatomy.  Contrast: OMNIPAQUE IOHEXOL 300 MG/ML  SOLN  Comparison: 01/06/2012; 08/16/2009.  Findings: Suboptimally  late timing results in pulmonary veins brighter than the adjacent pulmonary arteries, and reduces diagnostic sensitivity and specificity of today's exam.  No filling defect is identified in the pulmonary arterial tree to suggest pulmonary embolus.  Atherosclerotic calcification of the aortic arch noted.  Small mediastinal lymph nodes are not pathologically enlarged by size criteria.  Borderline cardiomegaly noted.  Small Bochdalek hernia contains adipose tissue.  Stable 2 mm nodule anteriorly in the right lower lobe is considered benign.  There is mild scarring posteriorly in the lingula.  Mild atelectasis noted in the posterior basal segment left lower lobe.  Bilateral airway thickening noted with  mild plugging of small airways in the lower lobes.  IMPRESSION:  1.  Bilateral airway thickening suggesting bronchitis. 2.  No embolus is observed.  Mildly reduced sensitivity due to contrast timing issues and body habitus. 3.  Mild atherosclerosis. 4.  Mild atelectasis noted in the posted basal segment left lower lobe.  Original Report Authenticated By: Dellia Cloud, M.D.     1. Bronchitis      Date: 01/06/2012  Rate: 115  Rhythm: sinus tachycardia  QRS Axis: normal  Intervals: normal  ST/T Wave abnormalities: nonspecific T wave changes  Conduction Disutrbances:none  Narrative Interpretation:   Old EKG Reviewed: unchanged    MDM  Pt states she is feeling much better. Will f/u with PMD and return immediately for worsening symptoms        Loren Racer, MD 01/06/12 2037

## 2012-01-06 NOTE — ED Notes (Signed)
Pt has hx of asthma and bronchitis. Pt started to experience symptoms since Sunday, consulted her PCP on Tuesday, Her PCP send her to urgent care and pt was diagnosis with Bronchitis by xray at the urgent care, got send home on albuterol inhaler, pt came to ED today stating she is not feeling any better, reports symptoms of sob, productive cough and left chest pain. Pain 6/10, sat 94% RA,  100% 2 L [place], breath sound clear. BP 150/84.

## 2012-01-06 NOTE — ED Notes (Signed)
Pt to CT angio 

## 2012-01-06 NOTE — Discharge Instructions (Signed)
Bronchitis Bronchitis is a problem of the air tubes leading to your lungs. This problem makes it hard for air to get in and out of the lungs. You may cough a lot because your air tubes are narrow. Going without care can cause lasting (chronic) bronchitis. HOME CARE   Drink enough fluids to keep your pee (urine) clear or pale yellow.   Use a cool mist humidifier.   Quit smoking if you smoke. If you keep smoking, the bronchitis might not get better.   Only take medicine as told by your doctor.  GET HELP RIGHT AWAY IF:   Coughing keeps you awake.   You start to wheeze.   You become more sick or weak.   You have a hard time breathing or get short of breath.   You cough up blood.   Coughing lasts more than 2 weeks.   You have a fever.   Your baby is older than 3 months with a rectal temperature of 102 F (38.9 C) or higher.   Your baby is 58 months old or younger with a rectal temperature of 100.4 F (38 C) or higher.  MAKE SURE YOU:  Understand these instructions.   Will watch your condition.   Will get help right away if you are not doing well or get worse.  Document Released: 03/07/2008 Document Revised: 09/08/2011 Document Reviewed: 08/21/2009 [facility] Patient Information 2012 [place], [place].

## 2012-01-06 NOTE — ED Notes (Signed)
Respiratory therapist at bedside.

## 2012-05-08 ENCOUNTER — Other Ambulatory Visit: Payer: Self-pay | Admitting: Internal Medicine

## 2012-05-08 DIAGNOSIS — Z1231 Encounter for screening mammogram for malignant neoplasm of breast: Secondary | ICD-10-CM

## 2012-06-05 ENCOUNTER — Ambulatory Visit
Admission: RE | Admit: 2012-06-05 | Discharge: 2012-06-05 | Disposition: A | Discharge status: Home / Self Care | Payer: Medicare Other | Source: Ambulatory Visit | Attending: Internal Medicine | Admitting: Internal Medicine

## 2012-06-05 ENCOUNTER — Other Ambulatory Visit: Payer: Self-pay | Admitting: Internal Medicine

## 2012-06-05 DIAGNOSIS — M1990 Unspecified osteoarthritis, unspecified site: Secondary | ICD-10-CM

## 2012-06-05 DIAGNOSIS — M542 Cervicalgia: Secondary | ICD-10-CM

## 2012-06-14 ENCOUNTER — Encounter: Payer: Self-pay | Admitting: Cardiology

## 2012-06-18 ENCOUNTER — Ambulatory Visit
Admission: RE | Admit: 2012-06-18 | Discharge: 2012-06-18 | Disposition: A | Discharge status: Home / Self Care | Payer: Medicare Other | Source: Ambulatory Visit | Attending: Internal Medicine | Admitting: Internal Medicine

## 2012-06-18 DIAGNOSIS — Z1231 Encounter for screening mammogram for malignant neoplasm of breast: Secondary | ICD-10-CM

## 2012-11-19 ENCOUNTER — Encounter: Payer: Self-pay | Admitting: Internal Medicine

## 2013-02-28 ENCOUNTER — Ambulatory Visit
Admission: RE | Admit: 2013-02-28 | Discharge: 2013-02-28 | Disposition: A | Discharge status: Home / Self Care | Payer: Medicare Other | Source: Ambulatory Visit | Attending: Internal Medicine | Admitting: Internal Medicine

## 2013-02-28 ENCOUNTER — Other Ambulatory Visit: Payer: Self-pay | Admitting: Internal Medicine

## 2013-02-28 DIAGNOSIS — R079 Chest pain, unspecified: Secondary | ICD-10-CM

## 2013-05-14 ENCOUNTER — Other Ambulatory Visit: Payer: Self-pay

## 2013-05-14 DIAGNOSIS — Z1231 Encounter for screening mammogram for malignant neoplasm of breast: Secondary | ICD-10-CM

## 2013-06-25 ENCOUNTER — Ambulatory Visit: Payer: Medicare Other

## 2013-06-28 ENCOUNTER — Ambulatory Visit
Admission: RE | Admit: 2013-06-28 | Discharge: 2013-06-28 | Disposition: A | Discharge status: Home / Self Care | Payer: Medicare Other | Source: Ambulatory Visit

## 2013-06-28 DIAGNOSIS — Z1231 Encounter for screening mammogram for malignant neoplasm of breast: Secondary | ICD-10-CM

## 2014-05-23 ENCOUNTER — Other Ambulatory Visit: Payer: Self-pay

## 2014-05-23 DIAGNOSIS — Z1231 Encounter for screening mammogram for malignant neoplasm of breast: Secondary | ICD-10-CM

## 2014-06-05 ENCOUNTER — Ambulatory Visit: Payer: Self-pay | Admitting: Podiatry

## 2014-07-08 ENCOUNTER — Ambulatory Visit: Payer: Medicare Other

## 2014-07-10 ENCOUNTER — Ambulatory Visit
Admission: RE | Admit: 2014-07-10 | Discharge: 2014-07-10 | Disposition: A | Discharge status: Home / Self Care | Payer: Medicare Other | Source: Ambulatory Visit | Attending: Internal Medicine | Admitting: Internal Medicine

## 2014-07-10 ENCOUNTER — Other Ambulatory Visit: Payer: Self-pay | Admitting: Internal Medicine

## 2014-07-10 DIAGNOSIS — I509 Heart failure, unspecified: Secondary | ICD-10-CM

## 2014-07-10 DIAGNOSIS — M79661 Pain in right lower leg: Secondary | ICD-10-CM

## 2014-07-10 DIAGNOSIS — J45909 Unspecified asthma, uncomplicated: Secondary | ICD-10-CM

## 2014-07-17 ENCOUNTER — Ambulatory Visit
Admission: RE | Admit: 2014-07-17 | Discharge: 2014-07-17 | Disposition: A | Discharge status: Home / Self Care | Payer: Medicare Other | Source: Ambulatory Visit

## 2014-07-17 DIAGNOSIS — Z1231 Encounter for screening mammogram for malignant neoplasm of breast: Secondary | ICD-10-CM

## 2015-09-01 ENCOUNTER — Telehealth: Payer: Self-pay | Admitting: Podiatry

## 2015-09-01 NOTE — Telephone Encounter (Signed)
This patient called to the office on 08/28/2015. She states that she had a callus on the bottom of her foot that she self treated by removing the callus with her finger and applying Mercurochrome and Neosporin. She states she is not experiencing any pain or discomfort. I told her I believe this is a self-sealing condition and to elevate her foot until her pain resolves

## 2015-09-10 ENCOUNTER — Other Ambulatory Visit: Payer: Self-pay

## 2015-09-10 DIAGNOSIS — Z1231 Encounter for screening mammogram for malignant neoplasm of breast: Secondary | ICD-10-CM

## 2015-10-13 ENCOUNTER — Ambulatory Visit: Payer: Self-pay

## 2015-11-02 ENCOUNTER — Ambulatory Visit: Payer: Self-pay

## 2015-11-18 ENCOUNTER — Ambulatory Visit: Payer: Self-pay

## 2015-12-08 ENCOUNTER — Ambulatory Visit: Payer: Self-pay

## 2015-12-22 ENCOUNTER — Ambulatory Visit
Admission: RE | Admit: 2015-12-22 | Discharge: 2015-12-22 | Disposition: A | Discharge status: Home / Self Care | Payer: Medicare Other | Source: Ambulatory Visit

## 2015-12-22 DIAGNOSIS — Z1231 Encounter for screening mammogram for malignant neoplasm of breast: Secondary | ICD-10-CM

## 2015-12-29 ENCOUNTER — Ambulatory Visit: Payer: Self-pay

## 2016-03-04 ENCOUNTER — Ambulatory Visit (INDEPENDENT_AMBULATORY_CARE_PROVIDER_SITE_OTHER): Payer: Medicare Other

## 2016-03-04 ENCOUNTER — Ambulatory Visit (INDEPENDENT_AMBULATORY_CARE_PROVIDER_SITE_OTHER): Payer: Medicare Other | Admitting: Podiatry

## 2016-03-04 DIAGNOSIS — E1151 Type 2 diabetes mellitus with diabetic peripheral angiopathy without gangrene: Secondary | ICD-10-CM | POA: Diagnosis not present

## 2016-03-04 DIAGNOSIS — R52 Pain, unspecified: Secondary | ICD-10-CM | POA: Diagnosis not present

## 2016-03-04 DIAGNOSIS — Q828 Other specified congenital malformations of skin: Secondary | ICD-10-CM | POA: Diagnosis not present

## 2016-03-04 NOTE — Progress Notes (Signed)
   Subjective:    Patient ID: [name], female    DOB: 06-06-1941, 75 y.o.   MRN: [id number]  HPI The patient presents here today with right foot: (lateral side) that is painful and feels like walking on a rock.   Review of Systems  All other systems reviewed and are negative.      Objective:   Physical Exam        Assessment & Plan:

## 2016-03-07 NOTE — Progress Notes (Signed)
Subjective:     Patient ID: [name], female   DOB: 1941-06-13, 75 y.o.   MRN: [id number]  HPI patient presents with lesion on the side of the right foot that bothers her and makes it hard to walk   Review of Systems  All other systems reviewed and are negative.      Objective:   Physical Exam  Constitutional: She is oriented to person, place, and time.  Cardiovascular: Intact distal pulses.   Musculoskeletal: Normal range of motion.  Neurological: She is oriented to person, place, and time.  Skin: Skin is warm.  Nursing note and vitals reviewed.  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 This will need to be done periodically and patient will be seen back when symptomatic

## 2016-07-27 ENCOUNTER — Telehealth: Payer: Self-pay | Admitting: Interventional Cardiology

## 2016-07-27 NOTE — Telephone Encounter (Signed)
Spoke with pt to get scheduled for an appt with Dr. Katrinka Blazing to get her cleared for her total rt knee replacement.  Pt states that she is not planning on having the surgery soon and that it may even be a year from now.  Advised pt she would still need cardiac clearance so she would need to make an appt with [place] once she decided to move forward with the surgery.  Pt decided to go ahead and make an appt but wanted to make it for January.  Scheduled pt to see Dr. Katrinka Blazing 10/13/16. Advised pt that if she decided to proceed with surgery and needed a sooner appt to contact our office.  Pt verbalized understanding and was appreciative for call.    Spoke with Sherri at [employer] [employer] and advised her of information provided by pt.  Sherri verbalized understanding.

## 2016-08-18 ENCOUNTER — Ambulatory Visit: Payer: Medicare Other | Admitting: Rheumatology

## 2016-09-06 DIAGNOSIS — M170 Bilateral primary osteoarthritis of knee: Secondary | ICD-10-CM | POA: Insufficient documentation

## 2016-09-06 NOTE — Progress Notes (Deleted)
Office Visit Note  Patient: [name]             Date of Birth: 01/19/1941           MRN: [id number]             PCP: Gwenyth Bender, MD Referring: Gwenyth Bender, MD Visit Date: 09/08/2016 Occupation: @GUAROCC @    Subjective:  No chief complaint on file.   History of Present Illness: [name] is a 75 y.o. female ***   Activities of Daily Living:  Patient reports morning stiffness for *** {minute/hour:19697}.   Patient {ACTIONS;DENIES/REPORTS:21021675::"Denies"} nocturnal pain.  Difficulty dressing/grooming: {ACTIONS;DENIES/REPORTS:21021675::"Denies"} Difficulty climbing stairs: {ACTIONS;DENIES/REPORTS:21021675::"Denies"} Difficulty getting out of chair: {ACTIONS;DENIES/REPORTS:21021675::"Denies"} Difficulty using hands for taps, buttons, cutlery, and/or writing: {ACTIONS;DENIES/REPORTS:21021675::"Denies"}   Review of Systems  Constitutional: Positive for fatigue.  HENT: Negative for mouth sores and mouth dryness.   Eyes: Negative for dryness.  Respiratory: Negative for shortness of breath.   Gastrointestinal: Negative for constipation and diarrhea.  Musculoskeletal: Positive for myalgias and myalgias.  Skin: Negative for sensitivity to sunlight.  Psychiatric/Behavioral: Positive for sleep disturbance. Negative for decreased concentration.    PMFS History:  Patient Active Problem List   Diagnosis Date Noted  . Bilateral primary osteoarthritis of knee 09/06/2016  . SLEEP APNEA 05/15/2008  . HYPOTHYROIDISM 04/03/2007  . DIABETES MELLITUS, TYPE II 04/03/2007  . HYPERLIPIDEMIA 04/03/2007  . ASTHMA 04/03/2007  . GERD 04/03/2007  . Seropositive rheumatoid arthritis of multiple sites (HCC) 04/03/2007  . OSTEOARTHRITIS 04/03/2007  . DDD (degenerative disc disease), lumbar 04/03/2007  . DDD (degenerative disc disease), cervical 04/03/2007  . Fibromyalgia 04/03/2007  . 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 TACHYCARDIA, HX OF 04/03/2007  . STATUS, KNEE JOINT  REPLACEMENT 04/03/2007    Past Medical History:  Diagnosis Date  . Asthma   . Bronchitis   . COPD (chronic obstructive pulmonary disease)   . Diabetes mellitus   . Fibromyalgia   . Hyperlipidemia   . Hypertension   . Osteoporosis   . Rheumatoid aortitis   . Sleep apnea     Family History  Problem Relation Age of Onset  . Diabetes Mother   . Heart failure Mother   . Cancer Mother   . Heart failure Father    Past Surgical History:  Procedure Laterality Date  . JOINT REPLACEMENT     left total knee  . TOTAL KNEE ARTHROPLASTY     Social History   Social History Narrative  . No narrative on file     Objective: Vital Signs: There were no vitals taken for this visit.   Physical Exam  Constitutional: She is oriented to person, place, and time. She appears well-developed and well-nourished.  HENT:  Head: Normocephalic and atraumatic.  Eyes: EOM are normal. Pupils are equal, round, and reactive to light.  Cardiovascular: Normal rate, regular rhythm and normal heart sounds.  Exam reveals no gallop and no friction rub.   No murmur heard. Pulmonary/Chest: Effort normal and breath sounds normal. She has no wheezes. She has no rales.  Abdominal: Soft. Bowel sounds are normal. She exhibits no distension. There is no tenderness. There is no guarding. No hernia.  Musculoskeletal: Normal range of motion. She exhibits no edema, tenderness or deformity.  Lymphadenopathy:    She has no cervical adenopathy.  Neurological: She is alert and oriented to person, place, and time. Coordination normal.  Skin: Skin is warm and dry. Capillary refill takes less than 2 seconds. No rash  noted.  Psychiatric: She has a normal mood and affect. Her behavior is normal.  Nursing note and vitals reviewed.    Musculoskeletal Exam: ***  CDAI Exam: No CDAI exam completed.    Investigation: Findings:  05/21/2014 Left shoulder joint x-ray  shows mild to moderate shoulder joint narrowing.  Right knee  joint x-ray today shows severe medial compartment narrowing.  05/25/2015 CBC CMP normal     Imaging: No results found.  Speciality Comments: No specialty comments available.    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 The last bone density that we have for the patient was done and [facility] 5 years ago. It showed osteopenia.  Right trochanteric bursitis Orders: No orders of the defined types were placed in this encounter.  No orders of the defined types were placed in this encounter.   Face-to-face time spent with patient was 30 minutes. 50% of time was spent in counseling and coordination of care.  Follow-Up Instructions: No Follow-up on file.   Tawni Pummel, PA-C

## 2016-09-08 ENCOUNTER — Encounter (INDEPENDENT_AMBULATORY_CARE_PROVIDER_SITE_OTHER): Payer: Medicare Other | Admitting: Rheumatology

## 2016-09-08 DIAGNOSIS — M5136 Other intervertebral disc degeneration, lumbar region: Secondary | ICD-10-CM | POA: Diagnosis not present

## 2016-09-08 DIAGNOSIS — G894 Chronic pain syndrome: Secondary | ICD-10-CM

## 2016-09-08 DIAGNOSIS — M797 Fibromyalgia: Secondary | ICD-10-CM

## 2016-09-08 DIAGNOSIS — M5030 Other cervical disc degeneration, unspecified cervical region: Secondary | ICD-10-CM | POA: Diagnosis not present

## 2016-09-08 DIAGNOSIS — Z79899 Other long term (current) drug therapy: Secondary | ICD-10-CM | POA: Diagnosis not present

## 2016-09-08 DIAGNOSIS — M170 Bilateral primary osteoarthritis of knee: Secondary | ICD-10-CM

## 2016-09-08 DIAGNOSIS — R7611 Nonspecific reaction to tuberculin skin test without active tuberculosis: Secondary | ICD-10-CM | POA: Diagnosis not present

## 2016-09-08 DIAGNOSIS — M7061 Trochanteric bursitis, right hip: Secondary | ICD-10-CM | POA: Diagnosis not present

## 2016-09-08 DIAGNOSIS — M0579 Rheumatoid arthritis with rheumatoid factor of multiple sites without organ or systems involvement: Secondary | ICD-10-CM | POA: Diagnosis not present

## 2016-09-08 DIAGNOSIS — M8588 Other specified disorders of bone density and structure, other site: Secondary | ICD-10-CM | POA: Diagnosis not present

## 2016-09-15 NOTE — Progress Notes (Deleted)
   Office Visit Note  Patient: [name]             Date of Birth: 01/22/1941           MRN: [id number]             PCP: Gwenyth Bender, MD Referring: Gwenyth Bender, MD Visit Date: 09/19/2016 Occupation: @GUAROCC @    Subjective:  No chief complaint on file.   History of Present Illness: [name] is a 75 y.o. female ***   Activities of Daily Living:  Patient reports morning stiffness for *** {minute/hour:19697}.   Patient {ACTIONS;DENIES/REPORTS:21021675::"Denies"} nocturnal pain.  Difficulty dressing/grooming: {ACTIONS;DENIES/REPORTS:21021675::"Denies"} Difficulty climbing stairs: {ACTIONS;DENIES/REPORTS:21021675::"Denies"} Difficulty getting out of chair: {ACTIONS;DENIES/REPORTS:21021675::"Denies"} Difficulty using hands for taps, buttons, cutlery, and/or writing: {ACTIONS;DENIES/REPORTS:21021675::"Denies"}   No Rheumatology ROS completed.   PMFS History:  Patient Active Problem List   Diagnosis Date Noted  . Bilateral primary osteoarthritis of knee 09/06/2016  . SLEEP APNEA 05/15/2008  . HYPOTHYROIDISM 04/03/2007  . DIABETES MELLITUS, TYPE II 04/03/2007  . HYPERLIPIDEMIA 04/03/2007  . ASTHMA 04/03/2007  . GERD 04/03/2007  . Seropositive rheumatoid arthritis of multiple sites (HCC) 04/03/2007  . OSTEOARTHRITIS 04/03/2007  . DDD (degenerative disc disease), lumbar 04/03/2007  . DDD (degenerative disc disease), cervical 04/03/2007  . Fibromyalgia 04/03/2007  . 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 TACHYCARDIA, HX OF 04/03/2007  . STATUS, KNEE JOINT REPLACEMENT 04/03/2007    Past Medical History:  Diagnosis Date  . Asthma   . Bronchitis   . COPD (chronic obstructive pulmonary disease)   . Diabetes mellitus   . Fibromyalgia   . Hyperlipidemia   . Hypertension   . Osteoporosis   . Rheumatoid aortitis   . Sleep apnea     Family History  Problem Relation Age of Onset  . Diabetes Mother   . Heart failure Mother   . Cancer Mother   . Heart failure  Father    Past Surgical History:  Procedure Laterality Date  . JOINT REPLACEMENT     left total knee  . TOTAL KNEE ARTHROPLASTY     Social History   Social History Narrative  . No narrative on file     Objective: Vital Signs: There were no vitals taken for this visit.   Physical Exam   Musculoskeletal Exam: ***  CDAI Exam: No CDAI exam completed.    Investigation: Findings:  01/01/16 UDS normal c/w meds used narcotic agreement 09/29/15     Imaging: No results found.  Speciality Comments: No specialty comments available.    Procedures:  No procedures performed Allergies: Codeine; Darvon [propoxyphene hcl]; Oxycodone hcl; Propoxyphene n-acetaminophen; Valium [diazepam]; and Valsartan   Assessment / Plan:     Visit Diagnoses: Seropositive rheumatoid arthritis of multiple sites (HCC)  DDD (degenerative disc disease), lumbar  Fibromyalgia  Gastroesophageal reflux disease without esophagitis  Bilateral primary osteoarthritis of knee  DDD (degenerative disc disease), cervical  History of diabetes mellitus   History of + PPD treated with INH Trochanteric Bursitis Right hip  Orders: No orders of the defined types were placed in this encounter.  No orders of the defined types were placed in this encounter.   Face-to-face time spent with patient was *** minutes. 50% of time was spent in counseling and coordination of care.  Follow-Up Instructions: No Follow-up on file.   Ersa Delaney, RT

## 2016-09-19 ENCOUNTER — Ambulatory Visit: Payer: Medicare Other | Admitting: Rheumatology

## 2016-10-05 ENCOUNTER — Ambulatory Visit: Payer: Medicare Other | Admitting: Rheumatology

## 2016-10-13 ENCOUNTER — Encounter: Payer: Medicare Other | Admitting: Interventional Cardiology

## 2016-10-17 ENCOUNTER — Ambulatory Visit (INDEPENDENT_AMBULATORY_CARE_PROVIDER_SITE_OTHER): Payer: Medicare Other | Admitting: Rheumatology

## 2016-10-17 ENCOUNTER — Encounter: Payer: Self-pay | Admitting: Rheumatology

## 2016-10-17 VITALS — BP 131/85 | HR 100 | Resp 16 | Ht 62.0 in | Wt 170.0 lb

## 2016-10-17 DIAGNOSIS — M19041 Primary osteoarthritis, right hand: Secondary | ICD-10-CM | POA: Diagnosis not present

## 2016-10-17 DIAGNOSIS — M25511 Pain in right shoulder: Secondary | ICD-10-CM | POA: Diagnosis not present

## 2016-10-17 DIAGNOSIS — M1711 Unilateral primary osteoarthritis, right knee: Secondary | ICD-10-CM | POA: Diagnosis not present

## 2016-10-17 DIAGNOSIS — G8929 Other chronic pain: Secondary | ICD-10-CM | POA: Diagnosis not present

## 2016-10-17 DIAGNOSIS — M19042 Primary osteoarthritis, left hand: Secondary | ICD-10-CM

## 2016-10-17 DIAGNOSIS — M25561 Pain in right knee: Secondary | ICD-10-CM | POA: Diagnosis not present

## 2016-10-17 MED ORDER — TRIAMCINOLONE ACETONIDE 40 MG/ML IJ SUSP
40.0000 mg | INTRAMUSCULAR | Status: AC | PRN
  Administered 2016-10-17: 40 mg via INTRA_ARTICULAR

## 2016-10-17 MED ORDER — LIDOCAINE HCL 1 % IJ SOLN
1.5000 mL | INTRAMUSCULAR | Status: AC | PRN
  Administered 2016-10-17: 1.5 mL

## 2016-10-17 MED ORDER — DICLOFENAC SODIUM 1 % TD GEL: TRANSDERMAL | 3 refills | Status: DC

## 2016-10-17 MED ORDER — LIDOCAINE HCL 1 % IJ SOLN
1.0000 mL | INTRAMUSCULAR | Status: AC | PRN
  Administered 2016-10-17: 1 mL

## 2016-10-17 NOTE — Progress Notes (Signed)
Office Visit Note  Patient: [name]             Date of Birth: Aug 25, 1941           MRN: [id number]             PCP: Rogers Blocker, MD Referring: Rogers Blocker, MD Visit Date: 10/17/2016 Occupation: @GUAROCC @    Subjective:  Follow-up Follow-up on osteoarthritis of the knee joint.  History of Present Illness: [name] is a 76 y.o. female  Last seen 02/10/2016. Patient is complaining of right knee joint pain and is requesting a cortisone injection. Her last visit to our office was spent 07/22/2016 at which time we did give her pain to the right knee joint.  She is also complaining of right shoulder joint pain and is requesting cortisone injection for that as well.  Her positive rheumatoid factor but no synovitis on examination.  She has a history of left total knee replacement.  She has a history of DDD of the C-spine and L-spine.  She is a history of osteopenia and her last DEXA was done 09/14/2011   Activities of Daily Living:  Patient reports morning stiffness for 30 minutes.   Patient Reports nocturnal pain.  Difficulty dressing/grooming: Reports Difficulty climbing stairs: Reports Difficulty getting out of chair: Reports Difficulty using hands for taps, buttons, cutlery, and/or writing: Reports   Review of Systems  Constitutional: Negative for fatigue.  HENT: Negative for mouth sores and mouth dryness.   Eyes: Negative for dryness.  Respiratory: Negative for shortness of breath.   Gastrointestinal: Negative for constipation and diarrhea.  Musculoskeletal: Negative for myalgias and myalgias.  Skin: Negative for sensitivity to sunlight.  Psychiatric/Behavioral: Negative for decreased concentration and sleep disturbance.    PMFS History:  Patient Active Problem List   Diagnosis Date Noted  . Bilateral primary osteoarthritis of knee 09/06/2016  . SLEEP APNEA 05/15/2008  . HYPOTHYROIDISM 04/03/2007  . DIABETES MELLITUS, TYPE II 04/03/2007  .  HYPERLIPIDEMIA 04/03/2007  . ASTHMA 04/03/2007  . GERD 04/03/2007  . Seropositive rheumatoid arthritis of multiple sites ([place]) 04/03/2007  . OSTEOARTHRITIS 04/03/2007  . DDD (degenerative disc disease), lumbar 04/03/2007  . DDD (degenerative disc disease), cervical 04/03/2007  . Fibromyalgia 04/03/2007  . 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 TACHYCARDIA, HX OF 04/03/2007  . STATUS, KNEE JOINT REPLACEMENT 04/03/2007    Past Medical History:  Diagnosis Date  . Asthma   . Bronchitis   . COPD (chronic obstructive pulmonary disease) ([place])   . Diabetes mellitus   . Fibromyalgia   . Hyperlipidemia   . Hypertension   . Osteoporosis   . Rheumatoid aortitis   . Sleep apnea     Family History  Problem Relation Age of Onset  . Diabetes Mother   . Heart failure Mother   . Cancer Mother   . Heart failure Father   . Cancer Sister   . Cancer Sister   . Cancer Brother    Past Surgical History:  Procedure Laterality Date  . JOINT REPLACEMENT     left total knee  . TOTAL KNEE ARTHROPLASTY     Social History   Social History Narrative  . No narrative on file     Objective: Vital Signs: BP 131/85 (BP Location: Left Arm, Patient Position: Sitting, Cuff Size: Normal)   Pulse 100   Resp 16   Ht 5' 2"  (1.575 m)   Wt 170 lb (77.1 kg)   BMI 31.09 kg/m  Physical Exam  Constitutional: She is oriented to person, place, and time. She appears well-developed and well-nourished.  HENT:  Head: Normocephalic and atraumatic.  Eyes: EOM are normal. Pupils are equal, round, and reactive to light.  Cardiovascular: Normal rate, regular rhythm and normal heart sounds.  Exam reveals no gallop and no friction rub.   No murmur heard. Pulmonary/Chest: Effort normal and breath sounds normal. She has no wheezes. She has no rales.  Abdominal: Soft. Bowel sounds are normal. She exhibits no distension. There is no tenderness. There is no guarding. No hernia.  Musculoskeletal: Normal range of motion. She  exhibits no edema, tenderness or deformity.  Lymphadenopathy:    She has no cervical adenopathy.  Neurological: She is alert and oriented to person, place, and time. Coordination normal.  Skin: Skin is warm and dry. Capillary refill takes less than 2 seconds. No rash noted.  Psychiatric: She has a normal mood and affect. Her behavior is normal.  Nursing note and vitals reviewed.    Musculoskeletal Exam:  Full range of motion of all joints Grip strength is equal strong bilaterally Fiber myalgia tender points are all absent Patient ambulates with cane  CDAI Exam: No CDAI exam completed.    Investigation: No additional findings. No visits with results within 6 Month(s) from this visit.  Latest known visit with results is:  Admission on 01/06/2012, Discharged on 01/06/2012  Component Date Value Ref Range Status  . WBC 01/06/2012 8.8  4.0 - 10.5 K/uL Final  . RBC 01/06/2012 4.73  3.87 - 5.11 MIL/uL Final  . Hemoglobin 01/06/2012 12.9  12.0 - 15.0 g/dL Final  . HCT 01/06/2012 39.3  36.0 - 46.0 % Final  . MCV 01/06/2012 83.1  78.0 - 100.0 fL Final  . MCH 01/06/2012 27.3  26.0 - 34.0 pg Final  . MCHC 01/06/2012 32.8  30.0 - 36.0 g/dL Final  . RDW 01/06/2012 14.5  11.5 - 15.5 % Final  . Platelets 01/06/2012 245  150 - 400 K/uL Final  . Neutrophils Relative % 01/06/2012 51  43 - 77 % Final  . Neutro Abs 01/06/2012 4.5  1.7 - 7.7 K/uL Final  . Lymphocytes Relative 01/06/2012 40  12 - 46 % Final  . Lymphs Abs 01/06/2012 3.6  0.7 - 4.0 K/uL Final  . Monocytes Relative 01/06/2012 8  3 - 12 % Final  . Monocytes Absolute 01/06/2012 0.7  0.1 - 1.0 K/uL Final  . Eosinophils Relative 01/06/2012 1  0 - 5 % Final  . Eosinophils Absolute 01/06/2012 0.0  0.0 - 0.7 K/uL Final  . Basophils Relative 01/06/2012 0  0 - 1 % Final  . Basophils Absolute 01/06/2012 0.0  0.0 - 0.1 K/uL Final  . Sodium 01/06/2012 139  135 - 145 mEq/L Final  . Potassium 01/06/2012 3.1* 3.5 - 5.1 mEq/L Final  . Chloride  01/06/2012 101  96 - 112 mEq/L Final  . CO2 01/06/2012 26  19 - 32 mEq/L Final  . Glucose, Bld 01/06/2012 117* 70 - 99 mg/dL Final  . BUN 01/06/2012 10  6 - 23 mg/dL Final  . Creatinine, Ser 01/06/2012 0.95  0.50 - 1.10 mg/dL Final  . Calcium 01/06/2012 9.6  8.4 - 10.5 mg/dL Final  . Total Protein 01/06/2012 7.5  6.0 - 8.3 g/dL Final  . Albumin 01/06/2012 3.5  3.5 - 5.2 g/dL Final  . AST 01/06/2012 18  0 - 37 U/L Final  . ALT 01/06/2012 12  0 - 35 U/L Final  .  Alkaline Phosphatase 01/06/2012 86  39 - 117 U/L Final  . Total Bilirubin 01/06/2012 0.4  0.3 - 1.2 mg/dL Final  . GFR calc non Af Amer 01/06/2012 58* >90 mL/min Final  . GFR calc Af Amer 01/06/2012 68* >90 mL/min Final   Comment:                                 The eGFR has been calculated                          using the CKD EPI equation.                          This calculation has not been                          validated in all clinical                          situations.                          eGFR's persistently                          <90 mL/min signify                          possible Chronic Kidney Disease.  . Troponin i, poc 01/06/2012 0.00  0.00 - 0.08 ng/mL Final  . Comment 3 01/06/2012          Final   Comment: Due to the release kinetics of cTnI,                          a negative result within the first hours                          of the onset of symptoms does not rule out                          myocardial infarction with certainty.                          If myocardial infarction is still suspected,                          repeat the test at appropriate intervals.  Marland Kitchen D-Dimer, Quant 01/06/2012 2.88* 0.00 - 0.48 ug/mL-FEU Final   Comment:                                 AT THE INHOUSE ESTABLISHED CUTOFF                          VALUE OF 0.48 ug/mL FEU,                          THIS ASSAY HAS BEEN DOCUMENTED  IN THE LITERATURE TO HAVE                          A  SENSITIVITY AND NEGATIVE                          PREDICTIVE VALUE OF AT LEAST                          98 TO 99%.  THE TEST RESULT                          SHOULD BE CORRELATED WITH                          AN ASSESSMENT OF THE CLINICAL                          PROBABILITY OF DVT / VTE.     Imaging: No results found.  Speciality Comments: No specialty comments available.    Procedures:  Large Joint Inj Date/Time: 10/17/2016 4:34 PM Performed by: Eliezer Lofts Authorized by: Eliezer Lofts   Consent Given by:  Patient Site marked: the procedure site was marked   Timeout: prior to procedure the correct patient, procedure, and site was verified   Indications:  Pain Location:  Shoulder Site:  R glenohumeral Prep: patient was prepped and draped in usual sterile fashion   Needle Size:  27 G Needle Length:  1.5 inches Approach:  Posterior Ultrasound Guidance: No   Fluoroscopic Guidance: No   Arthrogram: No   Medications:  1 mL lidocaine 1 %; 40 mg triamcinolone acetonide 40 MG/ML Aspiration Attempted: Yes   Aspirate amount (mL):  0 Patient tolerance:  Patient tolerated the procedure well with no immediate complications Large Joint Inj Date/Time: 10/17/2016 4:35 PM Performed by: Eliezer Lofts Authorized by: Eliezer Lofts   Consent Given by:  Patient Site marked: the procedure site was marked   Timeout: prior to procedure the correct patient, procedure, and site was verified   Indications:  Pain and joint swelling Location:  Knee Site:  R knee Prep: patient was prepped and draped in usual sterile fashion   Needle Size:  27 G Needle Length:  1.5 inches Approach:  Medial Ultrasound Guidance: No   Fluoroscopic Guidance: No   Arthrogram: No   Medications:  1.5 mL lidocaine 1 %; 40 mg triamcinolone acetonide 40 MG/ML Aspiration Attempted: Yes   Patient tolerance:  Patient tolerated the procedure well with no immediate complications   Allergies: Codeine; Darvon  [propoxyphene hcl]; Oxycodone hcl; Propoxyphene n-acetaminophen; Valium [diazepam]; and Valsartan   Assessment / Plan:     Visit Diagnoses: Primary osteoarthritis of right knee  Chronic pain of right knee  Primary osteoarthritis of both hands  Chronic right shoulder pain   Plan: #1: History of OA of the knee joint and right knee joint pain. Recently finished flex agenic's in approximately October 2017. Patient had 5 Hyalgan injections. According to the patient she had "absolutely no relief".  #2: Right knee joint pain. Rates her discomfort as a 10 on a scale of 0-10. After the cortisone injection, her pain was a 2 on a scale of 0-10. This injection will last her for about 3 months. She needs a right total knee replacement probably but she is  unwilling to move forward with that because she has a history of left total knee replacement which causes her significant amount of pain prior to the surgery during the surgery and after the surgery. She is hesitant as a result.  #3: Right shoulder joint pain. No falls no injuries. Pain was a 10 before the injection and the 0 after the injection.  #4: Voltaren gel refill. She likes brand-name Voltaren gel which is more effective for her   #5: She is also having left shoulder joint pain and is requesting an injection but we cannot do that today. I have made an appointment for her for this Friday, January 19. In addition I'll give her right greater trochanter bursa injection also on that visit if her blood pressure is normal. She is requested to return to clinic on 10/21/2016 at 4:15. She is agreeable.   Orders: Orders Placed This Encounter  Procedures  . Large Joint Injection/Arthrocentesis  . Large Joint Injection/Arthrocentesis   Meds ordered this encounter  Medications  . diclofenac sodium (VOLTAREN) 1 % GEL    Sig: Voltaren Gel 3 grams to 3 large joints upto TID 3 TUBES with 3 refills    Dispense:  3 Tube    Refill:  3    Patient prefers  brand name. Please provide brand-name if possible. However patient does have financial constraints and may not be able to afford brand-name prices too high. Please accommodate patient and give her generic under those circumstances.    Order Specific Question:   Supervising Provider    Answer:   Bo Merino [phone]    Face-to-face time spent with patient was 30 minutes. 50% of time was spent in counseling and coordination of care.  Follow-Up Instructions: Return in about 5 months (around 03/17/2017) for oakj, knee pain, sj pain, openia, ddd c & l spine.   Eliezer Lofts, PA-C  Note - This record has been created using [employer].  Chart creation errors have been sought, but may not always  have been located. Such creation errors do not reflect on  the standard of medical care.

## 2016-10-21 ENCOUNTER — Ambulatory Visit: Payer: Self-pay | Admitting: Rheumatology

## 2016-10-24 DIAGNOSIS — I4891 Unspecified atrial fibrillation: Secondary | ICD-10-CM | POA: Insufficient documentation

## 2016-11-02 ENCOUNTER — Ambulatory Visit: Payer: Self-pay | Admitting: Rheumatology

## 2016-11-04 ENCOUNTER — Ambulatory Visit: Payer: Self-pay | Admitting: Rheumatology

## 2016-11-12 ENCOUNTER — Other Ambulatory Visit: Payer: Self-pay | Admitting: Rheumatology

## 2016-11-14 NOTE — Telephone Encounter (Signed)
Last Visit: 09/08/16 Next Visit: 03/17/17 UDS: 01/01/16 Narc Agreement: 09/29/15 Contacted patient and mailed a copy to patient for her to update agreement.   Okay to refill Tramadol?

## 2016-11-15 ENCOUNTER — Other Ambulatory Visit: Payer: Self-pay | Admitting: Internal Medicine

## 2016-11-15 DIAGNOSIS — Z1231 Encounter for screening mammogram for malignant neoplasm of breast: Secondary | ICD-10-CM

## 2016-12-07 ENCOUNTER — Ambulatory Visit: Payer: Self-pay | Admitting: Rheumatology

## 2016-12-07 DIAGNOSIS — M19042 Primary osteoarthritis, left hand: Secondary | ICD-10-CM

## 2016-12-07 DIAGNOSIS — Z96652 Presence of left artificial knee joint: Secondary | ICD-10-CM | POA: Insufficient documentation

## 2016-12-07 DIAGNOSIS — M19041 Primary osteoarthritis, right hand: Secondary | ICD-10-CM | POA: Insufficient documentation

## 2016-12-07 DIAGNOSIS — M25561 Pain in right knee: Secondary | ICD-10-CM

## 2016-12-07 DIAGNOSIS — G8929 Other chronic pain: Secondary | ICD-10-CM | POA: Insufficient documentation

## 2016-12-07 DIAGNOSIS — M25511 Pain in right shoulder: Secondary | ICD-10-CM

## 2016-12-07 DIAGNOSIS — Z8739 Personal history of other diseases of the musculoskeletal system and connective tissue: Secondary | ICD-10-CM | POA: Insufficient documentation

## 2016-12-07 NOTE — Progress Notes (Deleted)
   Office Visit Note  Patient: [name]             Date of Birth: 16-Nov-1940           MRN: [id number]             PCP: Gwenyth Bender, MD Referring: Gwenyth Bender, MD Visit Date: 12/07/2016 Occupation: @GUAROCC @    Subjective:  No chief complaint on file.   History of Present Illness: [name] is a 76 y.o. female ***   Activities of Daily Living:  Patient reports morning stiffness for *** {minute/hour:19697}.   Patient {ACTIONS;DENIES/REPORTS:21021675::"Denies"} nocturnal pain.  Difficulty dressing/grooming: {ACTIONS;DENIES/REPORTS:21021675::"Denies"} Difficulty climbing stairs: {ACTIONS;DENIES/REPORTS:21021675::"Denies"} Difficulty getting out of chair: {ACTIONS;DENIES/REPORTS:21021675::"Denies"} Difficulty using hands for taps, buttons, cutlery, and/or writing: {ACTIONS;DENIES/REPORTS:21021675::"Denies"}   No Rheumatology ROS completed.   PMFS History:  Patient Active Problem List   Diagnosis Date Noted  . Bilateral primary osteoarthritis of knee 09/06/2016  . SLEEP APNEA 05/15/2008  . HYPOTHYROIDISM 04/03/2007  . DIABETES MELLITUS, TYPE II 04/03/2007  . HYPERLIPIDEMIA 04/03/2007  . ASTHMA 04/03/2007  . GERD 04/03/2007  . Seropositive rheumatoid arthritis of multiple sites (HCC) 04/03/2007  . OSTEOARTHRITIS 04/03/2007  . DDD (degenerative disc disease), lumbar 04/03/2007  . DDD (degenerative disc disease), cervical 04/03/2007  . Fibromyalgia 04/03/2007  . 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 TACHYCARDIA, HX OF 04/03/2007  . STATUS, KNEE JOINT REPLACEMENT 04/03/2007    Past Medical History:  Diagnosis Date  . Asthma   . Bronchitis   . COPD (chronic obstructive pulmonary disease) (HCC)   . Diabetes mellitus   . Fibromyalgia   . Hyperlipidemia   . Hypertension   . Osteoporosis   . Rheumatoid aortitis   . Sleep apnea     Family History  Problem Relation Age of Onset  . Diabetes Mother   . Heart failure Mother   . Cancer Mother   . Heart  failure Father   . Cancer Sister   . Cancer Sister   . Cancer Brother    Past Surgical History:  Procedure Laterality Date  . JOINT REPLACEMENT     left total knee  . TOTAL KNEE ARTHROPLASTY     Social History   Social History Narrative  . No narrative on file     Objective: Vital Signs: There were no vitals taken for this visit.   Physical Exam   Musculoskeletal Exam: ***  CDAI Exam: No CDAI exam completed.    Investigation: Findings:   Last DEXA was done 09/14/2011   Recently finished flex agenic's in approximately October 2017. Patient had 5 Hyalgan injections.     Imaging: No results found.  Speciality Comments: No specialty comments available.    Procedures:  No procedures performed Allergies: Codeine; Darvon [propoxyphene hcl]; Oxycodone hcl; Propoxyphene n-acetaminophen; Valium [diazepam]; and Valsartan   Assessment / Plan:     Visit Diagnoses: No diagnosis found.    Orders: No orders of the defined types were placed in this encounter.  No orders of the defined types were placed in this encounter.   Face-to-face time spent with patient was *** minutes. 50% of time was spent in counseling and coordination of care.  Follow-Up Instructions: No Follow-up on file.   Rutha Melgoza, 12-26-1983  Note - This record has been created using [place].  Chart creation errors have been sought, but may not always  have been located. Such creation errors do not reflect on  the standard of medical care.

## 2016-12-09 NOTE — Progress Notes (Signed)
Pt no show. Therefore , erroneous encounter and note that was precharted was deleted.

## 2016-12-14 NOTE — Progress Notes (Deleted)
Office Visit Note  Patient: [name]             Date of Birth: 20-Oct-1940           MRN: [id number]             PCP: Gwenyth Bender, MD Referring: Gwenyth Bender, MD Visit Date: 12/15/2016 Occupation: @GUAROCC @    Subjective:  No chief complaint on file.   History of Present Illness: [name] is a 76 y.o. female ***   Activities of Daily Living:  Patient reports morning stiffness for *** {minute/hour:19697}.   Patient {ACTIONS;DENIES/REPORTS:21021675::"Denies"} nocturnal pain.  Difficulty dressing/grooming: {ACTIONS;DENIES/REPORTS:21021675::"Denies"} Difficulty climbing stairs: {ACTIONS;DENIES/REPORTS:21021675::"Denies"} Difficulty getting out of chair: {ACTIONS;DENIES/REPORTS:21021675::"Denies"} Difficulty using hands for taps, buttons, cutlery, and/or writing: {ACTIONS;DENIES/REPORTS:21021675::"Denies"}   No Rheumatology ROS completed.   PMFS History:  Patient Active Problem List   Diagnosis Date Noted  . Chronic pain of right knee 12/07/2016  . Primary osteoarthritis of both hands 12/07/2016  . Chronic right shoulder pain 12/07/2016  . History of osteopenia 12/07/2016  . 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 Bilateral primary osteoarthritis of knee 09/06/2016  . SLEEP APNEA 05/15/2008  . HYPOTHYROIDISM 04/03/2007  . DIABETES MELLITUS, TYPE II 04/03/2007  . HYPERLIPIDEMIA 04/03/2007  . ASTHMA 04/03/2007  . GERD 04/03/2007  . Seropositive rheumatoid arthritis of multiple sites (HCC) 04/03/2007  . Primary osteoarthritis of right knee 04/03/2007  . DDD (degenerative disc disease), lumbar 04/03/2007  . DDD (degenerative disc disease), cervical 04/03/2007  . Fibromyalgia 04/03/2007  . 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 TACHYCARDIA, HX OF 04/03/2007  . STATUS, KNEE JOINT REPLACEMENT 04/03/2007    Past Medical History:  Diagnosis Date  . Asthma   . Bronchitis   . COPD (chronic obstructive pulmonary disease) (HCC)   . Diabetes mellitus    . Fibromyalgia   . Hyperlipidemia   . Hypertension   . Osteoporosis   . Rheumatoid aortitis   . Sleep apnea     Family History  Problem Relation Age of Onset  . Diabetes Mother   . Heart failure Mother   . Cancer Mother   . Heart failure Father   . Cancer Sister   . Cancer Sister   . Cancer Brother    Past Surgical History:  Procedure Laterality Date  . JOINT REPLACEMENT     left total knee  . TOTAL KNEE ARTHROPLASTY     Social History   Social History Narrative  . No narrative on file     Objective: Vital Signs: There were no vitals taken for this visit.   Physical Exam   Musculoskeletal Exam: ***  CDAI Exam: No CDAI exam completed.    Investigation: Findings:  Recently finished flex agenic's in approximately October 2017  No visits with results within 6 Month(s) from this visit.  Latest known visit with results is:  Admission on 01/06/2012, Discharged on 01/06/2012  Component Date Value Ref Range Status  . WBC 01/06/2012 8.8  4.0 - 10.5 K/uL Final  . RBC 01/06/2012 4.73  3.87 - 5.11 MIL/uL Final  . Hemoglobin 01/06/2012 12.9  12.0 - 15.0 g/dL Final  . HCT 03/07/2012 39.3  36.0 - 46.0 % Final  . MCV 01/06/2012 83.1  78.0 - 100.0 fL Final  . MCH 01/06/2012 27.3  26.0 - 34.0 pg Final  . MCHC 01/06/2012 32.8  30.0 - 36.0 g/dL Final  . RDW 03/07/2012 14.5  11.5 - 15.5 % Final  . Platelets  01/06/2012 245  150 - 400 K/uL Final  . Neutrophils Relative % 01/06/2012 51  43 - 77 % Final  . Neutro Abs 01/06/2012 4.5  1.7 - 7.7 K/uL Final  . Lymphocytes Relative 01/06/2012 40  12 - 46 % Final  . Lymphs Abs 01/06/2012 3.6  0.7 - 4.0 K/uL Final  . Monocytes Relative 01/06/2012 8  3 - 12 % Final  . Monocytes Absolute 01/06/2012 0.7  0.1 - 1.0 K/uL Final  . Eosinophils Relative 01/06/2012 1  0 - 5 % Final  . Eosinophils Absolute 01/06/2012 0.0  0.0 - 0.7 K/uL Final  . Basophils Relative 01/06/2012 0  0 - 1 % Final  . Basophils Absolute 01/06/2012 0.0  0.0 - 0.1  K/uL Final  . Sodium 01/06/2012 139  135 - 145 mEq/L Final  . Potassium 01/06/2012 3.1* 3.5 - 5.1 mEq/L Final  . Chloride 01/06/2012 101  96 - 112 mEq/L Final  . CO2 01/06/2012 26  19 - 32 mEq/L Final  . Glucose, Bld 01/06/2012 117* 70 - 99 mg/dL Final  . BUN 01/06/2012 10  6 - 23 mg/dL Final  . Creatinine, Ser 01/06/2012 0.95  0.50 - 1.10 mg/dL Final  . Calcium 01/06/2012 9.6  8.4 - 10.5 mg/dL Final  . Total Protein 01/06/2012 7.5  6.0 - 8.3 g/dL Final  . Albumin 01/06/2012 3.5  3.5 - 5.2 g/dL Final  . AST 01/06/2012 18  0 - 37 U/L Final  . ALT 01/06/2012 12  0 - 35 U/L Final  . Alkaline Phosphatase 01/06/2012 86  39 - 117 U/L Final  . Total Bilirubin 01/06/2012 0.4  0.3 - 1.2 mg/dL Final  . GFR calc non Af Amer 01/06/2012 58* >90 mL/min Final  . GFR calc Af Amer 01/06/2012 68* >90 mL/min Final   Comment:                                 The eGFR has been calculated                          using the CKD EPI equation.                          This calculation has not been                          validated in all clinical                          situations.                          eGFR's persistently                          <90 mL/min signify                          possible Chronic Kidney Disease.  . Troponin i, poc 01/06/2012 0.00  0.00 - 0.08 ng/mL Final  . Comment 3 01/06/2012          Final   Comment: Due to the release kinetics of cTnI,  a negative result within the first hours                          of the onset of symptoms does not rule out                          myocardial infarction with certainty.                          If myocardial infarction is still suspected,                          repeat the test at appropriate intervals.  Marland Kitchen D-Dimer, Quant 01/06/2012 2.88* 0.00 - 0.48 ug/mL-FEU Final   Comment:                                 AT THE INHOUSE ESTABLISHED CUTOFF                          VALUE OF 0.48 ug/mL FEU,                           THIS ASSAY HAS BEEN DOCUMENTED                          IN THE LITERATURE TO HAVE                          A SENSITIVITY AND NEGATIVE                          PREDICTIVE VALUE OF AT LEAST                          98 TO 99%.  THE TEST RESULT                          SHOULD BE CORRELATED WITH                          AN ASSESSMENT OF THE CLINICAL                          PROBABILITY OF DVT / VTE.      Imaging: No results found.  Speciality Comments: No specialty comments available.    Procedures:  No procedures performed Allergies: Codeine; Darvon [propoxyphene hcl]; Oxycodone hcl; Propoxyphene n-acetaminophen; Valium [diazepam]; and Valsartan   Assessment / Plan:     Visit Diagnoses: No diagnosis found.    Orders: No orders of the defined types were placed in this encounter.  No orders of the defined types were placed in this encounter.   Face-to-face time spent with patient was *** minutes. 50% of time was spent in counseling and coordination of care.  Follow-Up Instructions: No Follow-up on file.   Maida Widger, [place]  Note - This record has been created using [employer].  Chart creation errors have been sought, but may not always  have been located. Such creation errors do not reflect on  the  standard of medical care. 

## 2016-12-15 ENCOUNTER — Ambulatory Visit: Payer: Self-pay | Admitting: Rheumatology

## 2016-12-18 DIAGNOSIS — Z9889 Other specified postprocedural states: Secondary | ICD-10-CM | POA: Insufficient documentation

## 2016-12-18 NOTE — Progress Notes (Signed)
Cardiology Office Note    Date:  12/19/2016   ID:  Katherine, Mcconnell [birth year], MRN [id number]  PCP:  Katherine Bender, MD  Cardiologist: Lesleigh Noe, MD   Chief Complaint  Patient presents with  . Pre-op Exam    Knee replacement    History of Present Illness:  [name] is a 76 y.o. female with prior history of atrial fibrillation although never on anticoagulation therapy and no prior history of CAD. Prior negative nuclear study and essentially normal coronary arteriogram (2003, [facility]). She is present to be cleared for right knee replacement surgery, although she has not determine if she will proceed.  Long-standing history of intermittent, usually less than 5 minute episodes of chest discomfort that may occur several times per week. It always occurs at night. It is not related to physical activity. There are no palpitations or common recurring features. She denies orthopnea and PND. Is no exertional related chest discomfort. She does not have syncope. She cannot remember what her complaints were when the diagnosis of atrial fibrillation was made.  Both her mother and father died of myocardial infarction at 65 and 79 years of age respectively. She is diabetic. She has a prior smoker.    Past Medical History:  Diagnosis Date  . Asthma   . Bronchitis   . COPD (chronic obstructive pulmonary disease) (HCC)   . Diabetes mellitus   . Fibromyalgia   . Hyperlipidemia   . Hypertension   . Osteoporosis   . Rheumatoid aortitis   . Sleep apnea     Past Surgical History:  Procedure Laterality Date  . JOINT REPLACEMENT     left total knee  . TOTAL KNEE ARTHROPLASTY      Current Medications: Outpatient Medications Prior to Visit  Medication Sig Dispense Refill  . diclofenac sodium (VOLTAREN) 1 % GEL Voltaren Gel 3 grams to 3 large joints upto TID 3 TUBES with 3 refills 3 Tube 3  . guaiFENesin (MUCINEX) 600 MG 12 hr tablet Take 600 mg by mouth 2 (two) times  daily as needed for congestion.    . Lancets (ONETOUCH ULTRASOFT) lancets     . Multiple Vitamin (MULITIVITAMIN WITH MINERALS) TABS Take 1 tablet by mouth daily.    Marland Kitchen omeprazole (PRILOSEC) 20 MG capsule Take 20 mg by mouth daily.    . ONE TOUCH ULTRA TEST test strip     . polyvinyl alcohol (LIQUIFILM TEARS) 1.4 % ophthalmic solution Place 2 drops into both eyes as needed.    Marland Kitchen RA VITAMIN D-3 2000 units CAPS Take 1 capsule by mouth daily.  0  . simvastatin (ZOCOR) 20 MG tablet Take 20 mg by mouth every evening.    . tiotropium (SPIRIVA) 18 MCG inhalation capsule Place 18 mcg into inhaler and inhale daily.    Marland Kitchen albuterol (PROVENTIL HFA;VENTOLIN HFA) 108 (90 BASE) MCG/ACT inhaler Inhale 1-2 puffs into the lungs every 6 (six) hours as needed for wheezing. 1 Inhaler 0  . diltiazem (CARDIZEM) 100 MG injection Take 120 mg by mouth daily.    Marland Kitchen levothyroxine (SYNTHROID, LEVOTHROID) 100 MCG tablet Take 50 mcg by mouth daily.    . meclizine (ANTIVERT) 12.5 MG tablet     . metFORMIN (GLUCOPHAGE) 500 MG tablet Take 500 mg by mouth once.    . metoprolol tartrate (LOPRESSOR) 25 MG tablet Take 25 mg by mouth 2 (two) times daily.    . montelukast (SINGULAIR) 10 MG tablet     .  predniSONE (DELTASONE) 10 MG tablet Take 2 tablets (20 mg total) by mouth daily. (Patient not taking: Reported on 10/17/2016) 10 tablet 0  . traMADol (ULTRAM) 50 MG tablet Take 50 mg by mouth every 6 (six) hours as needed.    . traMADol-acetaminophen (ULTRACET) 37.5-325 MG tablet take 2 tablets by mouth three times a day if needed 180 tablet 3   No facility-administered medications prior to visit.      Allergies:   Codeine; Darvon [propoxyphene hcl]; Oxycodone hcl; Propoxyphene n-acetaminophen; Valium [diazepam]; and Valsartan   Social History   Social History  . Marital status: Divorced    Spouse name: N/A  . Number of children: N/A  . Years of education: N/A   Social History Main Topics  . Smoking status: Former Smoker     Packs/day: 1.00    Years: 30.00    Types: Cigarettes    Quit date: 01/11/1996  . Smokeless tobacco: Never Used  . Alcohol use Yes     Comment: 2-3 glasses of wine per week  . Drug use: No  . Sexual activity: Not on file   Other Topics Concern  . Not on file   Social History Narrative  . No narrative on file     Family History:  The patient's family history includes Cancer in her brother, mother, sister, and sister; Diabetes in her mother; Heart failure in her father and mother.   ROS:   Please see the history of present illness.    Unexplained weight gain, occasional leg swelling, diarrhea, dizziness, easy bruising, occasional irregular heartbeat.  All other systems reviewed and are negative.   PHYSICAL EXAM:   VS:  BP 110/66 (BP Location: Left Arm)   Pulse 92   Ht 5\' 2"  (1.575 m)   Wt 174 lb (78.9 kg)   BMI 31.83 kg/m    GEN: Well nourished, well developed, in no acute distress . Moderate obesity. HEENT: normal  Neck: no JVD, carotid bruits, or masses Cardiac: RRR; no murmurs, rubs, or gallops,no edema  Respiratory:  clear to auscultation bilaterally, normal work of breathing GI: soft, nontender, nondistended, + BS MS: no deformity or atrophy  Skin: warm and dry, no rash Neuro:  Alert and Oriented x 3, Strength and sensation are intact Psych: euthymic mood, full affect  Wt Readings from Last 3 Encounters:  12/19/16 174 lb (78.9 kg)  10/17/16 170 lb (77.1 kg)  01/13/09 (!) 233 lb (105.7 kg)      Studies/Labs Reviewed:   EKG:  EKG  Sinus rhythm, left atrial abnormality, right atrial abnormality, low voltage. When compared to the prior tracing heart rate is slower.  Recent Labs: No results found for requested labs within last 8760 hours.   Lipid Panel    Component Value Date/Time   CHOL  06/06/2007 0415    123        ATP III CLASSIFICATION:  <200     mg/dL   Desirable  08/06/2007  mg/dL   Borderline High  233-007    mg/dL   High   TRIG 26 >=622 0415    HDL 51 06/06/2007 0415   CHOLHDL 2.4 06/06/2007 0415   VLDL 5 06/06/2007 0415   LDLCALC  06/06/2007 0415    67        Total Cholesterol/HDL:CHD Risk Coronary Heart Disease Risk Table                     Men   Women  1/2  Average Risk   3.4   3.3    Additional studies/ records that were reviewed today include:  Nuclear Stress test, May 2008: IMPRESSION:   No evidence of ischemia or infarct.  Ejection fraction 44 %.  Also had multiple prior normal nuclear studies in 2003, 2005, and 2007.   ASSESSMENT:    1. Pre-operative cardiovascular examination   2. Type 2 diabetes mellitus with complication, without long-term current use of insulin (HCC)   3. Hx of cardiac cath   4. Paroxysmal atrial fibrillation (HCC)   5. Primary osteoarthritis of right knee      PLAN:  In order of problems listed above:  1. The patient has no specific cardiac features that suggest high risk. No prior history of documented coronary disease or heart failure. She has a history of atrial fibrillation but I can find no evidence for this in available EKGs. I believe she would tolerate surgery without difficulty. Because of her sedentary lifestyle, I would recommend a myocardial perfusion study prior to proceeding. If she decides to have surgery we should do this within several weeks of the operation. Since she has not decided whether to have surgery and not I believe we will simply follow her clinically. 2. We discussed the importance of A1c less than 7. 3. Reviewed cardiac cath report from Dr. Sharyn Lull from 2003. 4. This is historical. She cannot give me much information about this diagnosis. 5. Right knee arthritis with possible knee replacement at some point in the future. A myocardial perfusion stress study will be done to clear her for that operation if she decides to have it.  Overall patient is doing well and is stable from the cardiac standpoint. No specific diagnosis at this time. Myocardial perfusion  imaging preceding knee replacement surgery if she decides to have it.    Medication Adjustments/Labs and Tests Ordered: Current medicines are reviewed at length with the patient today.  Concerns regarding medicines are outlined above.  Medication changes, Labs and Tests ordered today are listed in the Patient Instructions below. Patient Instructions  Medication Instructions:  None  Labwork: None  Testing/Procedures: None  Follow-Up: Your physician recommends that you schedule a follow-up appointment as needed with Dr. Katrinka Blazing.    Any Other Special Instructions Will Be Listed Below (If Applicable).     If you need a refill on your cardiac medications before your next appointment, please call your pharmacy.      Signed, Lesleigh Noe, MD  12/19/2016 3:28 PM    [facility] [address] [place], [place], [place]  [address] Phone: [phone]; Fax: [phone]

## 2016-12-19 ENCOUNTER — Encounter (INDEPENDENT_AMBULATORY_CARE_PROVIDER_SITE_OTHER): Payer: Self-pay

## 2016-12-19 ENCOUNTER — Encounter: Payer: Self-pay | Admitting: Interventional Cardiology

## 2016-12-19 ENCOUNTER — Ambulatory Visit (INDEPENDENT_AMBULATORY_CARE_PROVIDER_SITE_OTHER): Payer: Medicare Other | Admitting: Interventional Cardiology

## 2016-12-19 VITALS — BP 110/66 | HR 92 | Ht 62.0 in | Wt 174.0 lb

## 2016-12-19 DIAGNOSIS — E118 Type 2 diabetes mellitus with unspecified complications: Secondary | ICD-10-CM

## 2016-12-19 DIAGNOSIS — Z01810 Encounter for preprocedural cardiovascular examination: Secondary | ICD-10-CM | POA: Diagnosis not present

## 2016-12-19 DIAGNOSIS — Z9889 Other specified postprocedural states: Secondary | ICD-10-CM | POA: Diagnosis not present

## 2016-12-19 DIAGNOSIS — I480 Paroxysmal atrial fibrillation: Secondary | ICD-10-CM | POA: Diagnosis not present

## 2016-12-19 DIAGNOSIS — M1711 Unilateral primary osteoarthritis, right knee: Secondary | ICD-10-CM

## 2016-12-19 NOTE — Patient Instructions (Signed)
Medication Instructions:  None  Labwork: None  Testing/Procedures: None  Follow-Up: Your physician recommends that you schedule a follow-up appointment as needed with Dr. Smith.    Any Other Special Instructions Will Be Listed Below (If Applicable).     If you need a refill on your cardiac medications before your next appointment, please call your pharmacy.   

## 2016-12-23 ENCOUNTER — Ambulatory Visit
Admission: RE | Admit: 2016-12-23 | Discharge: 2016-12-23 | Disposition: A | Discharge status: Home / Self Care | Payer: Medicare Other | Source: Ambulatory Visit | Attending: Internal Medicine | Admitting: Internal Medicine

## 2016-12-23 DIAGNOSIS — Z1231 Encounter for screening mammogram for malignant neoplasm of breast: Secondary | ICD-10-CM

## 2017-01-12 DIAGNOSIS — M25512 Pain in left shoulder: Secondary | ICD-10-CM

## 2017-01-12 DIAGNOSIS — G8929 Other chronic pain: Secondary | ICD-10-CM | POA: Insufficient documentation

## 2017-01-12 NOTE — Progress Notes (Deleted)
Office Visit Note  Patient: [name]             Date of Birth: 04/16/1941           MRN: [id number]             PCP: Rogers Blocker, MD Referring: Rogers Blocker, MD Visit Date: 01/17/2017 Occupation: @GUAROCC @    Subjective:  No chief complaint on file.   History of Present Illness: [name] is a 76 y.o. female ***   Activities of Daily Living:  Patient reports morning stiffness for *** {minute/hour:19697}.   Patient {ACTIONS;DENIES/REPORTS:21021675::"Denies"} nocturnal pain.  Difficulty dressing/grooming: {ACTIONS;DENIES/REPORTS:21021675::"Denies"} Difficulty climbing stairs: {ACTIONS;DENIES/REPORTS:21021675::"Denies"} Difficulty getting out of chair: {ACTIONS;DENIES/REPORTS:21021675::"Denies"} Difficulty using hands for taps, buttons, cutlery, and/or writing: {ACTIONS;DENIES/REPORTS:21021675::"Denies"}   No Rheumatology ROS completed.   PMFS History:  Patient Active Problem List   Diagnosis Date Noted  . Hx of cardiac cath 12/18/2016  . Chronic pain of right knee 12/07/2016  . Primary osteoarthritis of both hands 12/07/2016  . Chronic right shoulder pain 12/07/2016  . History of osteopenia 12/07/2016  . 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 Atrial fibrillation ([place]) 10/24/2016  . OSA (obstructive sleep apnea) 05/15/2008  . HYPOTHYROIDISM 04/03/2007  . Diabetes mellitus ([place]) 04/03/2007  . HYPERLIPIDEMIA 04/03/2007  . ASTHMA 04/03/2007  . GERD 04/03/2007  . Seropositive rheumatoid arthritis of multiple sites ([place]) 04/03/2007  . Primary osteoarthritis of right knee 04/03/2007  . DDD (degenerative disc disease), lumbar 04/03/2007  . DDD (degenerative disc disease), cervical 04/03/2007  . Fibromyalgia 04/03/2007  . 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 TACHYCARDIA, HX OF 04/03/2007  . STATUS, KNEE JOINT REPLACEMENT 04/03/2007    Past Medical History:  Diagnosis Date  . Asthma   . Bronchitis   . COPD (chronic obstructive pulmonary  disease) ([place])   . Diabetes mellitus   . Fibromyalgia   . Hyperlipidemia   . Hypertension   . Osteoporosis   . Rheumatoid aortitis   . Sleep apnea     Family History  Problem Relation Age of Onset  . Diabetes Mother   . Heart failure Mother   . Cancer Mother   . Heart failure Father   . Cancer Sister   . Cancer Sister   . Cancer Brother    Past Surgical History:  Procedure Laterality Date  . JOINT REPLACEMENT     left total knee  . TOTAL KNEE ARTHROPLASTY     Social History   Social History Narrative  . No narrative on file     Objective: Vital Signs: There were no vitals taken for this visit.   Physical Exam   Musculoskeletal Exam: ***  CDAI Exam: No CDAI exam completed.    Investigation: Findings:  Recently finished flex agenic's in approximately October 2017. Patient had 5 Hyalgan injections  No visits with results within 6 Month(s) from this visit.  Latest known visit with results is:  Admission on 01/06/2012, Discharged on 01/06/2012  Component Date Value Ref Range Status  . WBC 01/06/2012 8.8  4.0 - 10.5 K/uL Final  . RBC 01/06/2012 4.73  3.87 - 5.11 MIL/uL Final  . Hemoglobin 01/06/2012 12.9  12.0 - 15.0 g/dL Final  . HCT 01/06/2012 39.3  36.0 - 46.0 % Final  . MCV 01/06/2012 83.1  78.0 - 100.0 fL Final  . MCH 01/06/2012 27.3  26.0 - 34.0 pg Final  . MCHC 01/06/2012 32.8  30.0 - 36.0 g/dL Final  . RDW  01/06/2012 14.5  11.5 - 15.5 % Final  . Platelets 01/06/2012 245  150 - 400 K/uL Final  . Neutrophils Relative % 01/06/2012 51  43 - 77 % Final  . Neutro Abs 01/06/2012 4.5  1.7 - 7.7 K/uL Final  . Lymphocytes Relative 01/06/2012 40  12 - 46 % Final  . Lymphs Abs 01/06/2012 3.6  0.7 - 4.0 K/uL Final  . Monocytes Relative 01/06/2012 8  3 - 12 % Final  . Monocytes Absolute 01/06/2012 0.7  0.1 - 1.0 K/uL Final  . Eosinophils Relative 01/06/2012 1  0 - 5 % Final  . Eosinophils Absolute 01/06/2012 0.0  0.0 - 0.7 K/uL Final  . Basophils Relative  01/06/2012 0  0 - 1 % Final  . Basophils Absolute 01/06/2012 0.0  0.0 - 0.1 K/uL Final  . Sodium 01/06/2012 139  135 - 145 mEq/L Final  . Potassium 01/06/2012 3.1* 3.5 - 5.1 mEq/L Final  . Chloride 01/06/2012 101  96 - 112 mEq/L Final  . CO2 01/06/2012 26  19 - 32 mEq/L Final  . Glucose, Bld 01/06/2012 117* 70 - 99 mg/dL Final  . BUN 01/06/2012 10  6 - 23 mg/dL Final  . Creatinine, Ser 01/06/2012 0.95  0.50 - 1.10 mg/dL Final  . Calcium 01/06/2012 9.6  8.4 - 10.5 mg/dL Final  . Total Protein 01/06/2012 7.5  6.0 - 8.3 g/dL Final  . Albumin 01/06/2012 3.5  3.5 - 5.2 g/dL Final  . AST 01/06/2012 18  0 - 37 U/L Final  . ALT 01/06/2012 12  0 - 35 U/L Final  . Alkaline Phosphatase 01/06/2012 86  39 - 117 U/L Final  . Total Bilirubin 01/06/2012 0.4  0.3 - 1.2 mg/dL Final  . GFR calc non Af Amer 01/06/2012 58* >90 mL/min Final  . GFR calc Af Amer 01/06/2012 68* >90 mL/min Final   Comment:                                 The eGFR has been calculated                          using the CKD EPI equation.                          This calculation has not been                          validated in all clinical                          situations.                          eGFR's persistently                          <90 mL/min signify                          possible Chronic Kidney Disease.  . Troponin i, poc 01/06/2012 0.00  0.00 - 0.08 ng/mL Final  . Comment 3 01/06/2012          Final   Comment: Due to the release kinetics of cTnI,  a negative result within the first hours                          of the onset of symptoms does not rule out                          myocardial infarction with certainty.                          If myocardial infarction is still suspected,                          repeat the test at appropriate intervals.  Marland Kitchen D-Dimer, Quant 01/06/2012 2.88* 0.00 - 0.48 ug/mL-FEU Final   Comment:                                 AT THE INHOUSE ESTABLISHED  CUTOFF                          VALUE OF 0.48 ug/mL FEU,                          THIS ASSAY HAS BEEN DOCUMENTED                          IN THE LITERATURE TO HAVE                          A SENSITIVITY AND NEGATIVE                          PREDICTIVE VALUE OF AT LEAST                          98 TO 99%.  THE TEST RESULT                          SHOULD BE CORRELATED WITH                          AN ASSESSMENT OF THE CLINICAL                          PROBABILITY OF DVT / VTE.      Imaging: Mm Screening Breast Tomo Bilateral  Result Date: 12/23/2016 CLINICAL DATA:  Screening. EXAM: 2D DIGITAL SCREENING BILATERAL MAMMOGRAM WITH CAD AND ADJUNCT TOMO COMPARISON:  Previous exam(s). ACR Breast Density Category b: There are scattered areas of fibroglandular density. FINDINGS: There are no findings suspicious for malignancy. Images were processed with CAD. IMPRESSION: No mammographic evidence of malignancy. A result letter of this screening mammogram Mcconnell be mailed directly to the patient. RECOMMENDATION: Screening mammogram in one year. (Code:SM-B-01Y) BI-RADS CATEGORY  1: Negative. Electronically Signed   By: Fidela Salisbury M.D.   On: 12/23/2016 14:35    Speciality Comments: No specialty comments available.    Procedures:  No procedures performed Allergies: Codeine; Darvon [propoxyphene hcl]; Oxycodone hcl; Propoxyphene n-acetaminophen; Valium [diazepam]; and Valsartan   Assessment / Plan:     Visit Diagnoses:  Primary osteoarthritis of right knee  Chronic pain of right knee  Primary osteoarthritis of both hands  Chronic right shoulder pain    Orders: No orders of the defined types were placed in this encounter.  No orders of the defined types were placed in this encounter.   Face-to-face time spent with patient was *** minutes. 50% of time was spent in counseling and coordination of care.  Follow-Up Instructions: No Follow-up on file.   Swati Granberry, [place]  Note - This  record has been created using [employer].  Chart creation errors have been sought, but may not always  have been located. Such creation errors do not reflect on  the standard of medical care.

## 2017-01-17 ENCOUNTER — Ambulatory Visit: Payer: Self-pay | Admitting: Rheumatology

## 2017-01-30 ENCOUNTER — Ambulatory Visit (INDEPENDENT_AMBULATORY_CARE_PROVIDER_SITE_OTHER): Payer: Medicare Other

## 2017-01-30 ENCOUNTER — Ambulatory Visit (INDEPENDENT_AMBULATORY_CARE_PROVIDER_SITE_OTHER): Payer: Medicare Other | Admitting: Rheumatology

## 2017-01-30 ENCOUNTER — Encounter: Payer: Self-pay | Admitting: Rheumatology

## 2017-01-30 VITALS — Resp 14 | Ht 62.0 in | Wt 175.0 lb

## 2017-01-30 DIAGNOSIS — Z8739 Personal history of other diseases of the musculoskeletal system and connective tissue: Secondary | ICD-10-CM

## 2017-01-30 DIAGNOSIS — M25571 Pain in right ankle and joints of right foot: Secondary | ICD-10-CM

## 2017-01-30 DIAGNOSIS — M1711 Unilateral primary osteoarthritis, right knee: Secondary | ICD-10-CM | POA: Diagnosis not present

## 2017-01-30 DIAGNOSIS — M0579 Rheumatoid arthritis with rheumatoid factor of multiple sites without organ or systems involvement: Secondary | ICD-10-CM

## 2017-01-30 DIAGNOSIS — G8929 Other chronic pain: Secondary | ICD-10-CM

## 2017-01-30 DIAGNOSIS — M25561 Pain in right knee: Secondary | ICD-10-CM

## 2017-01-30 DIAGNOSIS — M797 Fibromyalgia: Secondary | ICD-10-CM

## 2017-01-30 MED ORDER — PREDNISONE 5 MG PO TABS: ORAL_TABLET | ORAL | 1 refills | Status: DC

## 2017-01-30 NOTE — Progress Notes (Signed)
Office Visit Note  Patient: [name]             Date of Birth: 1940/10/12           MRN: [id number]             PCP: Rogers Blocker, MD Referring: Rogers Blocker, MD Visit Date: 01/30/2017 Occupation: @GUAROCC @    Subjective:  Pain of the Right Ankle   History of Present Illness: [name] is a 76 y.o. female with a history of rheumatoid arthritis and osteoarthritis.  Patient states she injured her right ankle 2 days ago after dropping a heavy box on it.  Patient states she was able to bear weight on her ankle joint immediately after, but she noticed swelling and bruising.  Patient denies any numbness or tingling.  Patient states the swelling and bruising have improved.  She states her toes on her left foot are also mildly sore.  Patient denies any other injuries.    Patient states she continues to have bilateral knee pain, and she would like to be put back on a small dose prednisone daily.  Patient states the cortisone injections in the past have only provided mild relief.  Patient states she has mild swelling and stiffness in her bilateral knees.     Activities of Daily Living:  Patient reports morning stiffness for 30 minutes.   Patient Reports nocturnal pain.  Difficulty dressing/grooming: Reports Difficulty climbing stairs: Reports Difficulty getting out of chair: Reports Difficulty using hands for taps, buttons, cutlery, and/or writing: Reports   Review of Systems  Constitutional: Negative for fatigue.  HENT: Negative for mouth sores and mouth dryness.   Eyes: Negative for dryness.  Respiratory: Negative for shortness of breath.   Gastrointestinal: Negative for constipation and diarrhea.  Musculoskeletal: Negative for myalgias and myalgias.  Skin: Negative for sensitivity to sunlight.  Psychiatric/Behavioral: Negative for decreased concentration and sleep disturbance.    PMFS History:  Patient Active Problem List   Diagnosis Date Noted  . Chronic left  shoulder pain 01/12/2017  . Hx of cardiac cath 12/18/2016  . Chronic pain of right knee 12/07/2016  . Primary osteoarthritis of both hands 12/07/2016  . Chronic right shoulder pain 12/07/2016  . History of osteopenia 12/07/2016  . 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 Atrial fibrillation ([place]) 10/24/2016  . OSA (obstructive sleep apnea) 05/15/2008  . HYPOTHYROIDISM 04/03/2007  . Diabetes mellitus ([place]) 04/03/2007  . HYPERLIPIDEMIA 04/03/2007  . ASTHMA 04/03/2007  . GERD 04/03/2007  . Seropositive rheumatoid arthritis of multiple sites ([place]) 04/03/2007  . Primary osteoarthritis of right knee 04/03/2007  . DDD (degenerative disc disease), lumbar 04/03/2007  . DDD (degenerative disc disease), cervical 04/03/2007  . Fibromyalgia 04/03/2007  . 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 TACHYCARDIA, HX OF 04/03/2007  . STATUS, KNEE JOINT REPLACEMENT 04/03/2007    Past Medical History:  Diagnosis Date  . Asthma   . Bronchitis   . COPD (chronic obstructive pulmonary disease) ([place])   . Diabetes mellitus   . Fibromyalgia   . Hyperlipidemia   . Hypertension   . Osteoporosis   . Rheumatoid aortitis   . Sleep apnea     Family History  Problem Relation Age of Onset  . Diabetes Mother   . Heart failure Mother   . Cancer Mother   . Heart failure Father   . Cancer Sister   . Cancer Sister   . Cancer Brother    Past  Surgical History:  Procedure Laterality Date  . JOINT REPLACEMENT     left total knee  . TOTAL KNEE ARTHROPLASTY     Social History   Social History Narrative  . No narrative on file     Objective: Vital Signs: Resp 14   Ht 5' 2"  (1.575 m)   Wt 175 lb (79.4 kg)   BMI 32.01 kg/m    Physical Exam  Constitutional: She is oriented to person, place, and time. She appears well-developed and well-nourished.  HENT:  Head: Normocephalic and atraumatic.  Eyes: EOM are normal. Pupils are equal, round, and reactive to light.  Cardiovascular: Normal rate,  regular rhythm and normal heart sounds.  Exam reveals no gallop and no friction rub.   No murmur heard. Pulmonary/Chest: Effort normal and breath sounds normal. She has no wheezes. She has no rales.  Abdominal: Soft. Bowel sounds are normal. She exhibits no distension. There is no tenderness. There is no guarding. No hernia.  Musculoskeletal: Normal range of motion. She exhibits no edema, tenderness or deformity.  Lymphadenopathy:    She has no cervical adenopathy.  Neurological: She is alert and oriented to person, place, and time. Coordination normal.  Skin: Skin is warm and dry. Capillary refill takes less than 2 seconds. No rash noted.  Psychiatric: She has a normal mood and affect. Her behavior is normal.  Nursing note and vitals reviewed.    Musculoskeletal Exam:  Full range of motion of all joints Grip strength is equal and strong bilaterally Fiber myalgia tender points are absent  CDAI Exam: CDAI Homunculus Exam:   Joint Counts:  CDAI Tender Joint count: 0 CDAI Swollen Joint count: 0  Global Assessments:  Patient Global Assessment: 9 Provider Global Assessment: 9  CDAI Calculated Score: 18  No synovitis on examination Patient rates her overall discomfort as a 9 on a scale of 0-10 on most days. She uses tramadol sparingly at 2 pills per day.  Investigation: No additional findings. No visits with results within 4 Month(s) from this visit.  Latest known visit with results is:  Admission on 01/06/2012, Discharged on 01/06/2012  Component Date Value Ref Range Status  . WBC 01/06/2012 8.8  4.0 - 10.5 K/uL Final  . RBC 01/06/2012 4.73  3.87 - 5.11 MIL/uL Final  . Hemoglobin 01/06/2012 12.9  12.0 - 15.0 g/dL Final  . HCT 01/06/2012 39.3  36.0 - 46.0 % Final  . MCV 01/06/2012 83.1  78.0 - 100.0 fL Final  . MCH 01/06/2012 27.3  26.0 - 34.0 pg Final  . MCHC 01/06/2012 32.8  30.0 - 36.0 g/dL Final  . RDW 01/06/2012 14.5  11.5 - 15.5 % Final  . Platelets 01/06/2012 245   150 - 400 K/uL Final  . Neutrophils Relative % 01/06/2012 51  43 - 77 % Final  . Neutro Abs 01/06/2012 4.5  1.7 - 7.7 K/uL Final  . Lymphocytes Relative 01/06/2012 40  12 - 46 % Final  . Lymphs Abs 01/06/2012 3.6  0.7 - 4.0 K/uL Final  . Monocytes Relative 01/06/2012 8  3 - 12 % Final  . Monocytes Absolute 01/06/2012 0.7  0.1 - 1.0 K/uL Final  . Eosinophils Relative 01/06/2012 1  0 - 5 % Final  . Eosinophils Absolute 01/06/2012 0.0  0.0 - 0.7 K/uL Final  . Basophils Relative 01/06/2012 0  0 - 1 % Final  . Basophils Absolute 01/06/2012 0.0  0.0 - 0.1 K/uL Final  . Sodium 01/06/2012 139  135 -  145 mEq/L Final  . Potassium 01/06/2012 3.1* 3.5 - 5.1 mEq/L Final  . Chloride 01/06/2012 101  96 - 112 mEq/L Final  . CO2 01/06/2012 26  19 - 32 mEq/L Final  . Glucose, Bld 01/06/2012 117* 70 - 99 mg/dL Final  . BUN 01/06/2012 10  6 - 23 mg/dL Final  . Creatinine, Ser 01/06/2012 0.95  0.50 - 1.10 mg/dL Final  . Calcium 01/06/2012 9.6  8.4 - 10.5 mg/dL Final  . Total Protein 01/06/2012 7.5  6.0 - 8.3 g/dL Final  . Albumin 01/06/2012 3.5  3.5 - 5.2 g/dL Final  . AST 01/06/2012 18  0 - 37 U/L Final  . ALT 01/06/2012 12  0 - 35 U/L Final  . Alkaline Phosphatase 01/06/2012 86  39 - 117 U/L Final  . Total Bilirubin 01/06/2012 0.4  0.3 - 1.2 mg/dL Final  . GFR calc non Af Amer 01/06/2012 58* >90 mL/min Final  . GFR calc Af Amer 01/06/2012 68* >90 mL/min Final   Comment:                                 The eGFR has been calculated                          using the CKD EPI equation.                          This calculation has not been                          validated in all clinical                          situations.                          eGFR's persistently                          <90 mL/min signify                          possible Chronic Kidney Disease.  . Troponin i, poc 01/06/2012 0.00  0.00 - 0.08 ng/mL Final  . Comment 3 01/06/2012          Final   Comment: Due to the release  kinetics of cTnI,                          a negative result within the first hours                          of the onset of symptoms does not rule out                          myocardial infarction with certainty.                          If myocardial infarction is still suspected,  repeat the test at appropriate intervals.  Marland Kitchen D-Dimer, Quant 01/06/2012 2.88* 0.00 - 0.48 ug/mL-FEU Final   Comment:                                 AT THE INHOUSE ESTABLISHED CUTOFF                          VALUE OF 0.48 ug/mL FEU,                          THIS ASSAY HAS BEEN DOCUMENTED                          IN THE LITERATURE TO HAVE                          A SENSITIVITY AND NEGATIVE                          PREDICTIVE VALUE OF AT LEAST                          98 TO 99%.  THE TEST RESULT                          SHOULD BE CORRELATED WITH                          AN ASSESSMENT OF THE CLINICAL                          PROBABILITY OF DVT / VTE.     Imaging: No results found.  Speciality Comments: No specialty comments available.   Procedures:  No procedures performed Allergies: Codeine; Darvon [propoxyphene hcl]; Oxycodone hcl; Propoxyphene n-acetaminophen; Valium [diazepam]; and Valsartan   Assessment / Plan:     Visit Diagnoses: Seropositive rheumatoid arthritis of multiple sites ([place])  Pain in right ankle and joints of right foot - 01/30/2017: Injury onset Saturday, 01/28/2017. - Plan: XR Ankle Complete Right  Fibromyalgia  Primary osteoarthritis of right knee  Chronic pain of right knee  History of osteopenia   Plan: #1 Seropositive RA: No synovitis; ongoing pain to various joints.  #2: High risk prescription Patient is requesting prednisone for the treatment of her significant arthritis pain. She was on prednisone 5 mg every other day back in approximately summer of 2011. I will do a trial of prednisone for the patient to see if she gets any benefit from  this. She'll start this medication in 2 weeks. Prednisone 5 mg every other day; 60 pills with 1 refill; patient will allow her current injury to be healed and then start the prednisone in mid May. We discussed patient taking the medication starting May 15 and then every other day Tuesday Thursday Saturday. Note: Patient is already on enteric-coated aspirin 81 mg Monday Wednesday Friday.  #3 Return to clinic in 3 months  #4: heavy weight fell onto right ankle on saturday, January 28, 2017 Bruise the medial aspect of her right lower leg just above the medial malleolus. X-ray shows no fracture.  #5: Ongoing pain on a daily basis. Patient rates her discomfort  as a 9 on a scale of 0-10. Uses tramadol sparingly at 2 pills a day.  Orders: Orders Placed This Encounter  Procedures  . XR Ankle Complete Right   No orders of the defined types were placed in this encounter.   Face-to-face time spent with patient was 30 minutes. 50% of time was spent in counseling and coordination of care.  Follow-Up Instructions: Return in about 3 months (around 05/01/2017) for oa kj pain, right medial lower leg/ankle pain, .   Eliezer Lofts, PA-C  Note - This record has been created using [employer].  Chart creation errors have been sought, but may not always  have been located. Such creation errors do not reflect on  the standard of medical care.

## 2017-03-17 ENCOUNTER — Ambulatory Visit: Payer: Medicare Other | Admitting: Rheumatology

## 2017-05-02 ENCOUNTER — Ambulatory Visit: Payer: Medicare Other | Admitting: Rheumatology

## 2017-05-04 DIAGNOSIS — Z7952 Long term (current) use of systemic steroids: Secondary | ICD-10-CM | POA: Insufficient documentation

## 2017-05-04 NOTE — Progress Notes (Signed)
Office Visit Note  Patient: [name]             Date of Birth: 06-Dec-1940           MRN: [id number]             PCP: Gwenyth Bender, MD Referring: Gwenyth Bender, MD Visit Date: 05/09/2017 Occupation: @GUAROCC @    Subjective:  Pain in multiple joints .   History of Present Illness: EEVIE LAPP is a 76 y.o. female with history of osteoarthritis. She states she went to [place] for 3 weeks and when she came back she started hurting a lot. She describes pain in her shoulders hips and her lower extremities. Neck and lower back pain is tolerable. She's been having discomfort in her bilateral trochanteric area. She is having swelling on her ankles which is more of fluid retention. She's been on low-dose prednisone since April 2018 she has not noticed any improvement on that.   Activities of Daily Living:  Patient reports morning stiffness for 1  hour.   Patient Reports nocturnal pain.  Difficulty dressing/grooming: Denies Difficulty climbing stairs: Reports Difficulty getting out of chair: Reports Difficulty using hands for taps, buttons, cutlery, and/or writing: Denies   Review of Systems  Constitutional: Negative.  Negative for fatigue, night sweats, weight gain, weight loss and weakness.  HENT: Negative for mouth sores, trouble swallowing, trouble swallowing, mouth dryness and nose dryness.   Eyes: Positive for dryness. Negative for pain, redness and visual disturbance.  Respiratory: Negative.  Negative for cough, shortness of breath and difficulty breathing.   Cardiovascular: Negative.  Negative for chest pain, palpitations, hypertension, irregular heartbeat and swelling in legs/feet.  Gastrointestinal: Negative.  Negative for blood in stool, constipation and diarrhea.  Endocrine: Negative.  Negative for increased urination.  Genitourinary: Negative.  Negative for nocturia and vaginal dryness.  Musculoskeletal: Positive for arthralgias, joint pain, myalgias, muscle  weakness, morning stiffness and myalgias. Negative for joint swelling and muscle tenderness.  Skin: Positive for hair loss. Negative for color change, rash, skin tightness, ulcers and sensitivity to sunlight.  Allergic/Immunologic: Negative for susceptible to infections.  Neurological: Negative.  Negative for dizziness, headaches, memory loss and night sweats.  Hematological: Negative.  Negative for swollen glands.  Psychiatric/Behavioral: Negative.  Negative for depressed mood and sleep disturbance. The patient is not nervous/anxious.     PMFS History:  Patient Active Problem List   Diagnosis Date Noted  . On prednisone therapy 05/04/2017  . Chronic left shoulder pain 01/12/2017  . Hx of cardiac cath 12/18/2016  . Chronic pain of right knee 12/07/2016  . Primary osteoarthritis of both hands 12/07/2016  . Chronic right shoulder pain 12/07/2016  . History of osteopenia 12/07/2016  . 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 Atrial fibrillation (HCC) 10/24/2016  . OSA (obstructive sleep apnea) 05/15/2008  . HYPOTHYROIDISM 04/03/2007  . Diabetes mellitus (HCC) 04/03/2007  . HYPERLIPIDEMIA 04/03/2007  . ASTHMA 04/03/2007  . GERD 04/03/2007  . Primary osteoarthritis of right knee 04/03/2007  . DDD (degenerative disc disease), lumbar 04/03/2007  . DDD (degenerative disc disease), cervical 04/03/2007  . Fibromyalgia 04/03/2007  . 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 TACHYCARDIA, HX OF 04/03/2007  . STATUS, KNEE JOINT REPLACEMENT 04/03/2007    Past Medical History:  Diagnosis Date  . Asthma   . Bronchitis   . COPD (chronic obstructive pulmonary disease) (HCC)   . Diabetes mellitus   . Fibromyalgia   . Hyperlipidemia   .  Hypertension   . Osteoporosis   . Rheumatoid aortitis   . Sleep apnea     Family History  Problem Relation Age of Onset  . Diabetes Mother   . Heart failure Mother   . Cancer Mother   . Heart failure Father   . Cancer Sister   . Cancer Sister   .  Cancer Brother    Past Surgical History:  Procedure Laterality Date  . JOINT REPLACEMENT     left total knee  . TOTAL KNEE ARTHROPLASTY     Social History   Social History Narrative  . No narrative on file     Objective: Vital Signs: BP 103/61   Pulse 99   Resp 14   Ht 5\' 3"  (1.6 m)   Wt 178 lb (80.7 kg)   BMI 31.53 kg/m    Physical Exam  Constitutional: She is oriented to person, place, and time. She appears well-developed and well-nourished.  HENT:  Head: Normocephalic and atraumatic.  Eyes: Conjunctivae and EOM are normal.  Neck: Normal range of motion.  Cardiovascular: Normal rate, regular rhythm, normal heart sounds and intact distal pulses.   Pulmonary/Chest: Effort normal and breath sounds normal.  Abdominal: Soft. Bowel sounds are normal.  Lymphadenopathy:    She has no cervical adenopathy.  Neurological: She is alert and oriented to person, place, and time.  Skin: Skin is warm and dry. Capillary refill takes less than 2 seconds.  Psychiatric: She has a normal mood and affect. Her behavior is normal.  Nursing note and vitals reviewed.    Musculoskeletal Exam: C-spine and thoracic lumbar spine discomfort with range of motion. Shoulder joints elbow joints wrist joints MCPs PIPs DIPs with good range of motion with discomfort but no synovitis was noted. Hip joints knee joints ankles MTPs PIPs with good range of motion with no synovitis. She is some pitting edema on lower extremities. Her left total knee is replaced which is doing well. She had tenderness on palpation of her right trochanteric bursa consistent with trochanteric bursitis. She also had discomfort range of motion of her right knee joint.  CDAI Exam: No CDAI exam completed.    Investigation: Findings:  01/01/2016 UDS Narcotic agreement updated 01/2017     Imaging: No results found.  Speciality Comments: No specialty comments available.    Procedures:  Large Joint Inj Date/Time: 05/09/2017 2:24  PM Performed by: 07/09/2017 Authorized by: Pollyann Savoy   Consent Given by:  Patient Site marked: the procedure site was marked   Timeout: prior to procedure the correct patient, procedure, and site was verified   Indications:  Pain and joint swelling Location:  Knee Site:  R knee Prep: patient was prepped and draped in usual sterile fashion   Needle Size:  27 G Needle Length:  1.5 inches Approach:  Medial Ultrasound Guidance: No   Fluoroscopic Guidance: No   Arthrogram: No   Medications:  1.5 mL lidocaine 1 %; 40 mg triamcinolone acetonide 40 MG/ML Aspiration Attempted: Yes   Aspirate amount (mL):  0 Patient tolerance:  Patient tolerated the procedure well with no immediate complications   Allergies: Codeine; Darvon [propoxyphene hcl]; Oxycodone hcl; Propoxyphene n-acetaminophen; Valium [diazepam]; and Valsartan   Assessment / Plan:     Visit Diagnoses: Polyarthralgia - Positive RF, negative CCP, no history of synovitis. Patient clinically does not have any synovitis.  On prednisone therapy - Since April 2018 , prednisone 5 mg by mouth every other day. Given by May 2018 PA. She  had no response to prednisone. Advised her to decrease her prednisone to 2.5 mg every other day and then discontinue after 2 weeks. Side effects of long-term use of prednisone were discussed.  Primary osteoarthritis of both hands: She has some stiffness and discomfort but no synovitis.   Trochanteric bursitis of right hip: I believe IT band has flared due to recent travel. His stretching exercises were discussed.  Primary osteoarthritis of right knee - No response to Visco supplement injections. As per her request right knee joint was prepped in sterile fashion injected with cortisone after different treatment options were discussed.  History of total knee replacement, left - With persistent pain  DDD (degenerative disc disease), cervical  DDD (degenerative disc disease), lumbar  chronic pain  Other chronic pain - On ultrasound 2 tablets by mouth every morning, UDS March 2017, narcotic agreement April 2018. We will check urine drug screen today.  Fibromyalgia: She continues to have generalized pain from fibromyalgia.  Positive TB test  History of atrial fibrillation/ tachycardia   History of hyperlipidemia  History of hypothyroidism  History of diabetes mellitus: I will advised her to monitor blood pressure closely after cortisone injection.  History of asthma  History of sleep apnea    Orders: Orders Placed This Encounter  Procedures  . Large Joint Injection/Arthrocentesis  . Pain Mgmt, Tramadol w/medMATCH, U  . Pain Mgmt, Profile 5 w/Conf, U   No orders of the defined types were placed in this encounter.   Face-to-face time spent with patient was 30 minutes. 50% of time was spent in counseling and coordination of care.  Follow-Up Instructions: Return in about 6 months (around 11/09/2017) for Osteoarthritis DDD.   Pollyann Savoy, MD  Note - This record has been created using Animal nutritionist.  Chart creation errors have been sought, but may not always  have been located. Such creation errors do not reflect on  the standard of medical care.

## 2017-05-09 ENCOUNTER — Encounter: Payer: Self-pay | Admitting: Rheumatology

## 2017-05-09 ENCOUNTER — Ambulatory Visit (INDEPENDENT_AMBULATORY_CARE_PROVIDER_SITE_OTHER): Payer: Medicare Other | Admitting: Rheumatology

## 2017-05-09 VITALS — BP 103/61 | HR 99 | Resp 14 | Ht 63.0 in | Wt 178.0 lb

## 2017-05-09 DIAGNOSIS — G8929 Other chronic pain: Secondary | ICD-10-CM

## 2017-05-09 DIAGNOSIS — Z8679 Personal history of other diseases of the circulatory system: Secondary | ICD-10-CM

## 2017-05-09 DIAGNOSIS — M2550 Pain in unspecified joint: Secondary | ICD-10-CM | POA: Diagnosis not present

## 2017-05-09 DIAGNOSIS — Z96652 Presence of left artificial knee joint: Secondary | ICD-10-CM

## 2017-05-09 DIAGNOSIS — M1711 Unilateral primary osteoarthritis, right knee: Secondary | ICD-10-CM | POA: Diagnosis not present

## 2017-05-09 DIAGNOSIS — M19042 Primary osteoarthritis, left hand: Secondary | ICD-10-CM

## 2017-05-09 DIAGNOSIS — M797 Fibromyalgia: Secondary | ICD-10-CM

## 2017-05-09 DIAGNOSIS — Z8709 Personal history of other diseases of the respiratory system: Secondary | ICD-10-CM

## 2017-05-09 DIAGNOSIS — M5030 Other cervical disc degeneration, unspecified cervical region: Secondary | ICD-10-CM | POA: Diagnosis not present

## 2017-05-09 DIAGNOSIS — M5136 Other intervertebral disc degeneration, lumbar region: Secondary | ICD-10-CM

## 2017-05-09 DIAGNOSIS — Z7952 Long term (current) use of systemic steroids: Secondary | ICD-10-CM | POA: Diagnosis not present

## 2017-05-09 DIAGNOSIS — M19041 Primary osteoarthritis, right hand: Secondary | ICD-10-CM

## 2017-05-09 DIAGNOSIS — M7061 Trochanteric bursitis, right hip: Secondary | ICD-10-CM | POA: Diagnosis not present

## 2017-05-09 DIAGNOSIS — R7611 Nonspecific reaction to tuberculin skin test without active tuberculosis: Secondary | ICD-10-CM

## 2017-05-09 DIAGNOSIS — Z79899 Other long term (current) drug therapy: Secondary | ICD-10-CM

## 2017-05-09 DIAGNOSIS — Z8669 Personal history of other diseases of the nervous system and sense organs: Secondary | ICD-10-CM

## 2017-05-09 DIAGNOSIS — Z8639 Personal history of other endocrine, nutritional and metabolic disease: Secondary | ICD-10-CM

## 2017-05-09 MED ORDER — LIDOCAINE HCL 1 % IJ SOLN
1.5000 mL | INTRAMUSCULAR | Status: AC | PRN
  Administered 2017-05-09: 1.5 mL

## 2017-05-09 MED ORDER — TRIAMCINOLONE ACETONIDE 40 MG/ML IJ SUSP
40.0000 mg | INTRAMUSCULAR | Status: AC | PRN
  Administered 2017-05-09: 40 mg via INTRA_ARTICULAR

## 2017-05-13 LAB — PAIN MGMT, PROFILE 5 W/CONF, U
ALPHAHYDROXYALPRAZOLAM: NEGATIVE ng/mL (ref <25)
Alphahydroxymidazolam: NEGATIVE ng/mL (ref <50)
Alphahydroxytriazolam: NEGATIVE ng/mL (ref <50)
Aminoclonazepam: NEGATIVE ng/mL (ref <25)
Amphetamines: NEGATIVE ng/mL (ref <500)
BARBITURATES: NEGATIVE ng/mL (ref <300)
Benzodiazepines: NEGATIVE ng/mL (ref <100)
CODEINE: NEGATIVE ng/mL (ref <50)
CREATININE: 162.8 mg/dL (ref >=20.0)
Cocaine Metabolite: NEGATIVE ng/mL (ref <150)
EDDP: NEGATIVE ng/mL (ref <100)
HYDROXYETHYLFLURAZEPAM: NEGATIVE ng/mL (ref <50)
Hydrocodone: NEGATIVE ng/mL (ref <50)
Hydromorphone: NEGATIVE ng/mL (ref <50)
Lorazepam: NEGATIVE ng/mL (ref <50)
MARIJUANA METABOLITE: NEGATIVE ng/mL (ref <20)
Methadone Metabolite: NEGATIVE ng/mL (ref <100)
Methadone: NEGATIVE ng/mL (ref <100)
Morphine: NEGATIVE ng/mL (ref <50)
NORHYDROCODONE: NEGATIVE ng/mL (ref <50)
Nordiazepam: NEGATIVE ng/mL (ref <50)
OPIATES: NEGATIVE ng/mL (ref <100)
OXYCODONE: NEGATIVE ng/mL (ref <100)
Oxazepam: NEGATIVE ng/mL (ref <50)
Oxidant: NEGATIVE ug/mL (ref <200)
PH: 6.35 (ref 4.5–9.0)
Temazepam: NEGATIVE ng/mL (ref <50)

## 2017-05-14 LAB — PAIN MGMT, TRAMADOL W/MEDMATCH, U
Desmethyltramadol: 5285 ng/mL — ABNORMAL HIGH (ref <100)
TRAMADOL: 15510 ng/mL — AB (ref <100)

## 2017-05-15 NOTE — Progress Notes (Signed)
C/w tt

## 2017-05-31 ENCOUNTER — Other Ambulatory Visit: Payer: Self-pay | Admitting: Rheumatology

## 2017-05-31 NOTE — Telephone Encounter (Signed)
Last Visit: 05/09/17 Next Visit: 11/07/17 UDS: 11/07/16 Narc Agreement: 01/29/17  Okay to refill Ultracet?

## 2017-05-31 NOTE — Telephone Encounter (Signed)
ok 

## 2017-08-15 NOTE — Progress Notes (Deleted)
Office Visit Note  Patient: [name]             Date of Birth: 1941-03-19           MRN: [id number]             PCP: Gwenyth Bender, MD Referring: Gwenyth Bender, MD Visit Date: 08/16/2017 Occupation: @GUAROCC @    Subjective:  No chief complaint on file.   History of Present Illness: [name] is a 76 y.o. female ***   Activities of Daily Living:  Patient reports morning stiffness for *** {minute/hour:19697}.   Patient {ACTIONS;DENIES/REPORTS:21021675::"Denies"} nocturnal pain.  Difficulty dressing/grooming: {ACTIONS;DENIES/REPORTS:21021675::"Denies"} Difficulty climbing stairs: {ACTIONS;DENIES/REPORTS:21021675::"Denies"} Difficulty getting out of chair: {ACTIONS;DENIES/REPORTS:21021675::"Denies"} Difficulty using hands for taps, buttons, cutlery, and/or writing: {ACTIONS;DENIES/REPORTS:21021675::"Denies"}   No Rheumatology ROS completed.   PMFS History:  Patient Active Problem List   Diagnosis Date Noted  . On prednisone therapy 05/04/2017  . Chronic left shoulder pain 01/12/2017  . Hx of cardiac cath 12/18/2016  . Chronic pain of right knee 12/07/2016  . Primary osteoarthritis of both hands 12/07/2016  . Chronic right shoulder pain 12/07/2016  . History of osteopenia 12/07/2016  . 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 Atrial fibrillation (HCC) 10/24/2016  . OSA (obstructive sleep apnea) 05/15/2008  . HYPOTHYROIDISM 04/03/2007  . Diabetes mellitus (HCC) 04/03/2007  . HYPERLIPIDEMIA 04/03/2007  . ASTHMA 04/03/2007  . GERD 04/03/2007  . Primary osteoarthritis of right knee 04/03/2007  . DDD (degenerative disc disease), lumbar 04/03/2007  . DDD (degenerative disc disease), cervical 04/03/2007  . Fibromyalgia 04/03/2007  . 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 TACHYCARDIA, HX OF 04/03/2007  . STATUS, KNEE JOINT REPLACEMENT 04/03/2007    Past Medical History:  Diagnosis Date  . Asthma   . Bronchitis   . COPD (chronic obstructive  pulmonary disease) (HCC)   . Diabetes mellitus   . Fibromyalgia   . Hyperlipidemia   . Hypertension   . Osteoporosis   . Rheumatoid aortitis   . Sleep apnea     Family History  Problem Relation Age of Onset  . Diabetes Mother   . Heart failure Mother   . Cancer Mother   . Heart failure Father   . Cancer Sister   . Cancer Sister   . Cancer Brother    Past Surgical History:  Procedure Laterality Date  . JOINT REPLACEMENT     left total knee  . TOTAL KNEE ARTHROPLASTY     Social History   Social History Narrative  . Not on file     Objective: Vital Signs: There were no vitals taken for this visit.   Physical Exam   Musculoskeletal Exam: ***  CDAI Exam: No CDAI exam completed.    Investigation: No additional findings. UDS: 05/09/2017 Narc agreement: 01/29/2017 Imaging: No results found.  Speciality Comments: No specialty comments available.    Procedures:  No procedures performed Allergies: Codeine; Darvon [propoxyphene hcl]; Oxycodone hcl; Propoxyphene n-acetaminophen; Valium [diazepam]; and Valsartan   Assessment / Plan:     Visit Diagnoses: Polyarthralgia - Positive RF, negative CCP, no history of synovitis.  On prednisone therapy - Since April 2018 , prednisone 5 mg by mouth every other day. Given by May 2018 PA. She had no response to prednisone. Advised her to decrease her predniso  Primary osteoarthritis of both hands  Primary osteoarthritis of right knee - No response to Visco supplement injections.  History of total knee replacement, left  Trochanteric bursitis, right  hip  DDD (degenerative disc disease), cervical  DDD (degenerative disc disease), lumbar  Fibromyalgia  Other chronic pain - Ultracet 2 tabs po tid, UDS  Positive TB test  History of hyperlipidemia  History of hypothyroidism  History of atrial fibrillation - tachycardia   History of asthma  History of sleep apnea  History of diabetes mellitus     Orders: No orders of the defined types were placed in this encounter.  No orders of the defined types were placed in this encounter.   Face-to-face time spent with patient was *** minutes. 50% of time was spent in counseling and coordination of care.  Follow-Up Instructions: No Follow-up on file.   Pollyann Savoy, MD  Note - This record has been created using Animal nutritionist.  Chart creation errors have been sought, but may not always  have been located. Such creation errors do not reflect on  the standard of medical care.

## 2017-08-16 ENCOUNTER — Ambulatory Visit: Payer: Medicare Other | Admitting: Rheumatology

## 2017-08-16 NOTE — Progress Notes (Signed)
Office Visit Note  Patient: [name]             Date of Birth: 11/25/1940           MRN: [id number]             PCP: Gwenyth Bender, MD Referring: Gwenyth Bender, MD Visit Date: 08/17/2017 Occupation: @GUAROCC @    Subjective:  Generalized pain.   History of Present Illness: [name] is a 76 y.o. female ith history of osteoarthritis and fibromyalgia syndrome she states she's been experiencing a lot of pain and discomfort since June 2018. She describes pain and discomfort in her right knee joint and right hip currently. She also has discomfort in her bilateral shoulders. she also has generalized pain from fibromyalgia.  Activities of Daily Living:  Patient reports morning stiffness for all day hours.   Patient Reports nocturnal pain.  Difficulty dressing/grooming: Reports Difficulty climbing stairs: Reports Difficulty getting out of chair: Reports Difficulty using hands for taps, buttons, cutlery, and/or writing: Denies   Review of Systems  Constitutional: Positive for fatigue. Negative for night sweats, weight gain, weight loss and weakness.  HENT: Negative for mouth sores, trouble swallowing, trouble swallowing, mouth dryness and nose dryness.   Eyes: Negative for pain, redness, visual disturbance and dryness.  Respiratory: Negative for cough, shortness of breath and difficulty breathing.   Cardiovascular: Negative for chest pain, palpitations, hypertension, irregular heartbeat and swelling in legs/feet.  Gastrointestinal: Negative for blood in stool, constipation and diarrhea.  Endocrine: Negative for increased urination.  Genitourinary: Negative for vaginal dryness.  Musculoskeletal: Positive for arthralgias, joint pain, myalgias, morning stiffness and myalgias. Negative for joint swelling, muscle weakness and muscle tenderness.  Skin: Negative for color change, rash, hair loss, skin tightness, ulcers and sensitivity to sunlight.  Allergic/Immunologic:  Negative for susceptible to infections.  Neurological: Negative for dizziness, memory loss and night sweats.  Hematological: Negative for swollen glands.  Psychiatric/Behavioral: Positive for sleep disturbance. Negative for depressed mood. The patient is not nervous/anxious.     PMFS History:  Patient Active Problem List   Diagnosis Date Noted  . On prednisone therapy 05/04/2017  . Chronic left shoulder pain 01/12/2017  . Hx of cardiac cath 12/18/2016  . Chronic pain of right knee 12/07/2016  . Primary osteoarthritis of both hands 12/07/2016  . Chronic right shoulder pain 12/07/2016  . History of osteopenia 12/07/2016  . 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 Atrial fibrillation (HCC) 10/24/2016  . OSA (obstructive sleep apnea) 05/15/2008  . HYPOTHYROIDISM 04/03/2007  . Diabetes mellitus (HCC) 04/03/2007  . HYPERLIPIDEMIA 04/03/2007  . ASTHMA 04/03/2007  . GERD 04/03/2007  . Primary osteoarthritis of right knee 04/03/2007  . DDD (degenerative disc disease), lumbar 04/03/2007  . DDD (degenerative disc disease), cervical 04/03/2007  . Fibromyalgia 04/03/2007  . 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 TACHYCARDIA, HX OF 04/03/2007  . STATUS, KNEE JOINT REPLACEMENT 04/03/2007    Past Medical History:  Diagnosis Date  . Asthma   . Bronchitis   . COPD (chronic obstructive pulmonary disease) (HCC)   . Diabetes mellitus   . Fibromyalgia   . Hyperlipidemia   . Hypertension   . Osteoporosis   . Rheumatoid aortitis   . Sleep apnea     Family History  Problem Relation Age of Onset  . Diabetes Mother   . Heart failure Mother   . Cancer Mother   . Heart failure Father   . Cancer Sister   .  Cancer Sister   . Cancer Brother   . Multiple sclerosis Son    Past Surgical History:  Procedure Laterality Date  . JOINT REPLACEMENT     left total knee  . TOTAL KNEE ARTHROPLASTY     Social History   Social History Narrative  . Not on file     Objective: Vital Signs:  BP 136/74 (BP Location: Left Arm, Patient Position: Sitting, Cuff Size: Normal)   Pulse 82   Resp 17   Ht 5\' 2"  (1.575 m)   Wt 187 lb (84.8 kg)   BMI 34.20 kg/m    Physical Exam  Constitutional: She is oriented to person, place, and time. She appears well-developed and well-nourished.  HENT:  Head: Normocephalic and atraumatic.  Eyes: Conjunctivae and EOM are normal.  Neck: Normal range of motion.  Cardiovascular: Normal rate, regular rhythm, normal heart sounds and intact distal pulses.  Pulmonary/Chest: Effort normal and breath sounds normal.  Abdominal: Soft. Bowel sounds are normal.  Lymphadenopathy:    She has no cervical adenopathy.  Neurological: She is alert and oriented to person, place, and time.  Skin: Skin is warm and dry. Capillary refill takes less than 2 seconds.  Psychiatric: She has a normal mood and affect. Her behavior is normal.  Nursing note and vitals reviewed.    Musculoskeletal Exam: C-spine and thoracic lumbar spine limited range of motion with discomfort. She has generalized hyperalgesia from fibromyalgia.she is right trochanteric bursa tenderness consistent with trochanteric bursitis. She's been having pain and discomfort in her right knee jointwithout effusion. She has left total knee replacement which continues to have discomfort. Shoulder joints, elbow joints, wrist joint MCPs PIPs DIPs with good range of motion with no synovitis.  CDAI Exam: No CDAI exam completed.    Investigation: No additional findings. UDS: 05/09/17 Narc agreement: 01/29/17 Imaging: No results found.  Speciality Comments: No specialty comments available.    Procedures:  Large Joint Inj: R knee on 08/17/2017 12:52 PM Indications: pain Details: 27 G 1.5 in needle, medial approach  Arthrogram: No  Medications: 1.5 mL lidocaine 1 %; 40 mg triamcinolone acetonide 40 MG/ML Aspirate: 0 mL Outcome: tolerated well, no immediate complications Procedure, treatment alternatives,  risks and benefits explained, specific risks discussed. Consent was given by the patient. Immediately prior to procedure a time out was called to verify the correct patient, procedure, equipment, support staff and site/side marked as required. Patient was prepped and draped in the usual sterile fashion.     Allergies: Codeine; Darvon [propoxyphene hcl]; Fentanyl; Oxycodone hcl; Propoxyphene n-acetaminophen; Valium [diazepam]; and Valsartan   Assessment / Plan:     Visit Diagnoses: Polyarthralgia - Positive RF, negative CCP, no history of synovitis.she had no synovitis on examination today.  Primary osteoarthritis of both hands: Joint protection discussed.  Trochanteric bursitis of right hip: She requests right trochanteric bursae injection. I will do it next visit.  Primary osteoarthritis of right knee - No response to Visco supplement injections.per her request right  Knee joint was injected with cortisone. The procedures described above.  History of total knee replacement, left: She has some discomfort.  Fibromyalgia: She has generalized pain and positive tender points.  DDD (degenerative disc disease), cervical: Chronic pain  DDD (degenerative disc disease), lumbar: Chronic pain  Other chronic pain - Tramadol. UDS: 8/7/18Narc agreement: 01/29/17  Other medical problems listed as follows:  Positive TB test  History of hyperlipidemia  History of atrial fibrillation  History of sleep apnea  History of asthma  History of hypothyroidism  History of diabetes mellitus:: I advised her to monitor her blood sugar closely.  History of gastroesophageal reflux (GERD)    Orders: No orders of the defined types were placed in this encounter.  No orders of the defined types were placed in this encounter.   She'll be back in couple of weeks for right trochanteric bursitis injection if her blood sugars is a stable.  Follow-Up Instructions: Return in about 6 months (around  02/14/2018) for Osteoarthritis.   Pollyann Savoy, MD  Note - This record has been created using Animal nutritionist.  Chart creation errors have been sought, but may not always  have been located. Such creation errors do not reflect on  the standard of medical care.

## 2017-08-17 ENCOUNTER — Ambulatory Visit (INDEPENDENT_AMBULATORY_CARE_PROVIDER_SITE_OTHER): Payer: Medicare Other | Admitting: Rheumatology

## 2017-08-17 ENCOUNTER — Encounter: Payer: Self-pay | Admitting: Rheumatology

## 2017-08-17 VITALS — BP 136/74 | HR 82 | Resp 17 | Ht 62.0 in | Wt 187.0 lb

## 2017-08-17 DIAGNOSIS — M7061 Trochanteric bursitis, right hip: Secondary | ICD-10-CM | POA: Diagnosis not present

## 2017-08-17 DIAGNOSIS — G8929 Other chronic pain: Secondary | ICD-10-CM

## 2017-08-17 DIAGNOSIS — M19042 Primary osteoarthritis, left hand: Secondary | ICD-10-CM

## 2017-08-17 DIAGNOSIS — M1711 Unilateral primary osteoarthritis, right knee: Secondary | ICD-10-CM

## 2017-08-17 DIAGNOSIS — R7611 Nonspecific reaction to tuberculin skin test without active tuberculosis: Secondary | ICD-10-CM

## 2017-08-17 DIAGNOSIS — M5030 Other cervical disc degeneration, unspecified cervical region: Secondary | ICD-10-CM | POA: Diagnosis not present

## 2017-08-17 DIAGNOSIS — M5136 Other intervertebral disc degeneration, lumbar region: Secondary | ICD-10-CM | POA: Diagnosis not present

## 2017-08-17 DIAGNOSIS — Z96652 Presence of left artificial knee joint: Secondary | ICD-10-CM

## 2017-08-17 DIAGNOSIS — M797 Fibromyalgia: Secondary | ICD-10-CM | POA: Diagnosis not present

## 2017-08-17 DIAGNOSIS — M2550 Pain in unspecified joint: Secondary | ICD-10-CM

## 2017-08-17 DIAGNOSIS — M19041 Primary osteoarthritis, right hand: Secondary | ICD-10-CM

## 2017-08-17 DIAGNOSIS — Z8639 Personal history of other endocrine, nutritional and metabolic disease: Secondary | ICD-10-CM

## 2017-08-17 DIAGNOSIS — Z8709 Personal history of other diseases of the respiratory system: Secondary | ICD-10-CM

## 2017-08-17 DIAGNOSIS — Z8669 Personal history of other diseases of the nervous system and sense organs: Secondary | ICD-10-CM

## 2017-08-17 DIAGNOSIS — Z8679 Personal history of other diseases of the circulatory system: Secondary | ICD-10-CM

## 2017-08-17 DIAGNOSIS — Z8719 Personal history of other diseases of the digestive system: Secondary | ICD-10-CM

## 2017-08-17 MED ORDER — TRIAMCINOLONE ACETONIDE 40 MG/ML IJ SUSP
40.0000 mg | INTRAMUSCULAR | Status: AC | PRN
  Administered 2017-08-17: 40 mg via INTRA_ARTICULAR

## 2017-08-17 MED ORDER — LIDOCAINE HCL 1 % IJ SOLN
1.5000 mL | INTRAMUSCULAR | Status: AC | PRN
  Administered 2017-08-17: 1.5 mL

## 2017-09-06 ENCOUNTER — Ambulatory Visit: Payer: Medicare Other | Admitting: Rheumatology

## 2017-09-15 ENCOUNTER — Ambulatory Visit (INDEPENDENT_AMBULATORY_CARE_PROVIDER_SITE_OTHER): Payer: Medicare Other | Admitting: Orthopaedic Surgery

## 2017-09-22 ENCOUNTER — Ambulatory Visit (INDEPENDENT_AMBULATORY_CARE_PROVIDER_SITE_OTHER): Payer: Self-pay

## 2017-09-22 ENCOUNTER — Ambulatory Visit (INDEPENDENT_AMBULATORY_CARE_PROVIDER_SITE_OTHER): Payer: Medicare Other | Admitting: Orthopaedic Surgery

## 2017-09-22 ENCOUNTER — Encounter (INDEPENDENT_AMBULATORY_CARE_PROVIDER_SITE_OTHER): Payer: Self-pay | Admitting: Orthopaedic Surgery

## 2017-09-22 ENCOUNTER — Telehealth: Payer: Self-pay

## 2017-09-22 VITALS — BP 138/75 | HR 86 | Ht 62.0 in | Wt 182.0 lb

## 2017-09-22 DIAGNOSIS — M25551 Pain in right hip: Secondary | ICD-10-CM | POA: Diagnosis not present

## 2017-09-22 MED ORDER — BUPIVACAINE HCL 0.5 % IJ SOLN
2.0000 mL | INTRAMUSCULAR | Status: AC | PRN
  Administered 2017-09-22: 2 mL via INTRA_ARTICULAR

## 2017-09-22 MED ORDER — METHYLPREDNISOLONE ACETATE 40 MG/ML IJ SUSP
80.0000 mg | INTRAMUSCULAR | Status: AC | PRN
  Administered 2017-09-22: 80 mg

## 2017-09-22 MED ORDER — DICLOFENAC SODIUM 1 % TD GEL: TRANSDERMAL | 3 refills | Status: DC

## 2017-09-22 MED ORDER — LIDOCAINE HCL 1 % IJ SOLN
2.0000 mL | INTRAMUSCULAR | Status: AC | PRN
  Administered 2017-09-22: 2 mL

## 2017-09-22 NOTE — Telephone Encounter (Signed)
Patient would like a Rx refill on Voltaren Gel.  Cb# os [phone].  Please advise.  Thank you.

## 2017-09-22 NOTE — Progress Notes (Signed)
Office Visit Note   Patient: [name]           Date of Birth: Oct 11, 1940           MRN: [id number] Visit Date: 09/22/2017              Requested by: Gwenyth Bender, MD [address] [place], [place] [address] PCP: Gwenyth Bender, MD   Assessment & Plan: Visit Diagnoses:  1. Pain in right hip   2. Pain of right hip joint     Plan: Pain over the right greater trochanteric region, could be bursitis. We'll try a cortisone injection and monitor her response. This could be referred pain from her back. Hip joints appear to be clear  Follow-Up Instructions: Return if symptoms worsen or fail to improve.   Orders:  Orders Placed This Encounter  Procedures  . Large Joint Inj: R greater trochanter  . XR HIP UNILAT W OR W/O PELVIS 2-3 VIEWS RIGHT   No orders of the defined types were placed in this encounter.     Procedures: Large Joint Inj: R greater trochanter on 09/22/2017 1:16 PM Indications: pain and diagnostic evaluation Details: 25 G 1.5 in needle  Arthrogram: No  Medications: 2 mL lidocaine 1 %; 2 mL bupivacaine 0.5 %; 80 mg methylPREDNISolone acetate 40 MG/ML Procedure, treatment alternatives, risks and benefits explained, specific risks discussed. Consent was given by the patient. Immediately prior to procedure a time out was called to verify the correct patient, procedure, equipment, support staff and site/side marked as required. Patient was prepped and draped in the usual sterile fashion.       Clinical Data: No additional findings.   Subjective: Chief Complaint  Patient presents with  . Right Hip - Pain    [name] is a 76 y.o. with hx of osteoarthritis and fibromyalgia syndrome she states she's been experiencing a lot of pain and discomfort since June 2018. She describes pain and discomfort in her right knee joint and right hip currently.   Pain seems to be localized in the area of the great right greater trochanter. No related back pain or  referred buttock discomfort. No pain into either thigh or calf. Denies groin pain. No injury or trauma. Denies numbness or tingling.  HPI  Review of Systems  Constitutional: Positive for fatigue. Negative for chills and fever.  HENT: Positive for hearing loss and tinnitus.   Eyes: Negative for itching.  Respiratory: Negative for chest tightness and shortness of breath.   Cardiovascular: Negative for chest pain, palpitations and leg swelling.  Gastrointestinal: Negative for blood in stool, constipation and diarrhea.  Endocrine: Negative for polyuria.  Genitourinary: Negative for dysuria.  Musculoskeletal: Negative for back pain, joint swelling, neck pain and neck stiffness.  Allergic/Immunologic: Negative for immunocompromised state.  Neurological: Positive for dizziness and light-headedness. Negative for numbness.  Hematological: Does not bruise/bleed easily.  Psychiatric/Behavioral: The patient is not nervous/anxious.      Objective: Vital Signs: BP 138/75   Pulse 86   Ht 5\' 2"  (1.575 m)   Wt 182 lb (82.6 kg)   BMI 33.29 kg/m   Physical Exam  Ortho Exam awake alert and oriented 3. Comfortable sitting position. Large legs. No pain with range of motion of either hip. Straight leg raise negative. Tenderness directly over the greater trochanter. No buttock pain no back pain. No pain over either sequelae  Specialty Comments:  No specialty comments available.  Imaging: Xr Hip Unilat W  Or W/o Pelvis 2-3 Views Right  Result Date: 09/22/2017 . AP pelvis and lateral of the right hip were obtained. There is a large calcified uterine fibroid. Hip joint spaces are well maintained. No ectopic calcification. No evidence of fracture. Degenerative changes of the lower lumbar spine at L5-S1 are identified on a limited basis as visualized on  the pelvic films. No abnormality over the greater trochanter of the right hip with the patient is symptomatic    PMFS History: Patient Active  Problem List   Diagnosis Date Noted  . On prednisone therapy 05/04/2017  . Chronic left shoulder pain 01/12/2017  . Hx of cardiac cath 12/18/2016  . Chronic pain of right knee 12/07/2016  . Primary osteoarthritis of both hands 12/07/2016  . Chronic right shoulder pain 12/07/2016  . History of osteopenia 12/07/2016  . 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 Atrial fibrillation (HCC) 10/24/2016  . OSA (obstructive sleep apnea) 05/15/2008  . HYPOTHYROIDISM 04/03/2007  . Diabetes mellitus (HCC) 04/03/2007  . HYPERLIPIDEMIA 04/03/2007  . ASTHMA 04/03/2007  . GERD 04/03/2007  . Primary osteoarthritis of right knee 04/03/2007  . DDD (degenerative disc disease), lumbar 04/03/2007  . DDD (degenerative disc disease), cervical 04/03/2007  . Fibromyalgia 04/03/2007  . 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 TACHYCARDIA, HX OF 04/03/2007  . STATUS, KNEE JOINT REPLACEMENT 04/03/2007   Past Medical History:  Diagnosis Date  . Asthma   . Bronchitis   . COPD (chronic obstructive pulmonary disease) (HCC)   . Diabetes mellitus   . Fibromyalgia   . Hyperlipidemia   . Hypertension   . Osteoporosis   . Rheumatoid aortitis   . Sleep apnea     Family History  Problem Relation Age of Onset  . Diabetes Mother   . Heart failure Mother   . Cancer Mother   . Heart failure Father   . Cancer Sister   . Cancer Sister   . Cancer Brother   . Multiple sclerosis Son     Past Surgical History:  Procedure Laterality Date  . JOINT REPLACEMENT     left total knee  . TOTAL KNEE ARTHROPLASTY     Social History   Occupational History  . Not on file  Tobacco Use  . Smoking status: Former Smoker    Packs/day: 1.00    Years: 30.00    Pack years: 30.00    Types: Cigarettes    Last attempt to quit: 01/11/1996    Years since quitting: 21.7  . Smokeless tobacco: Never Used  Substance and Sexual Activity  . Alcohol use: Yes    Comment: 2-3 glasses of wine per week  . Drug use: No  . Sexual  activity: Not on file

## 2017-09-22 NOTE — Addendum Note (Signed)
Addended by: Henriette Combs on: 09/22/2017 04:03 PM   Modules accepted: Orders

## 2017-09-22 NOTE — Telephone Encounter (Signed)
Shanda Bumps with [facility] called stating that Voltaren Gel needs a PA.  Prior Auth.# is [id number].  CB# [phone].  Please advise.  Thank you.

## 2017-09-22 NOTE — Telephone Encounter (Signed)
Last Visit: 08/17/17 Next Visit: 02/15/18  Okay to refill per Dr. Corliss Skains

## 2017-09-27 NOTE — Telephone Encounter (Signed)
A prior authorization for Diclofenac gel has been submitted to pts insurance via cover my meds. Will update once we receive a response.   Manuela Halbur, [place], CPhT 8:15 AM

## 2017-09-27 NOTE — Telephone Encounter (Signed)
Received a fax from [employer] regarding a prior authorization approval for diclofenac gel through 10/02/2018.   Reference number:[id number] Phone number: [phone]  Will send document to scan center.  Called patient to update. Patient voices understanding and denies any questions at this time.  Brice Kossman, [place], CPhT 8:42 AM

## 2017-10-24 ENCOUNTER — Other Ambulatory Visit: Payer: Self-pay | Admitting: *Deleted

## 2017-10-24 MED ORDER — DICLOFENAC SODIUM 1 % TD GEL: TRANSDERMAL | 3 refills | Status: DC

## 2017-10-24 NOTE — Telephone Encounter (Signed)
Refill request received via fax  Last visit: 08/17/17 Next Visit: 02/15/18  Okay to refill per Dr. Corliss Skains

## 2017-11-07 ENCOUNTER — Ambulatory Visit: Payer: Medicare Other | Admitting: Rheumatology

## 2017-11-13 ENCOUNTER — Other Ambulatory Visit: Payer: Self-pay | Admitting: Internal Medicine

## 2017-11-13 DIAGNOSIS — Z1231 Encounter for screening mammogram for malignant neoplasm of breast: Secondary | ICD-10-CM

## 2017-12-21 ENCOUNTER — Other Ambulatory Visit: Payer: Self-pay | Admitting: Rheumatology

## 2017-12-21 DIAGNOSIS — Z5181 Encounter for therapeutic drug level monitoring: Secondary | ICD-10-CM

## 2017-12-21 NOTE — Telephone Encounter (Signed)
Last visit: 08/17/17 Next Visit: 02/15/18 UDS: 05/09/17 Narc Agreement: 01/29/17  Patient states she will come 12/22/17 to update her UDS.   Okay to refill 30 day supply Ultracet?

## 2017-12-21 NOTE — Telephone Encounter (Signed)
Patient called requesting prescription refill of Tramadol.  Patient's pharmacy is [facility] on [address].

## 2017-12-21 NOTE — Telephone Encounter (Signed)
ok 

## 2017-12-22 MED ORDER — TRAMADOL-ACETAMINOPHEN 37.5-325 MG PO TABS: ORAL_TABLET | ORAL | 0 refills | Status: DC

## 2017-12-22 NOTE — Progress Notes (Deleted)
Office Visit Note  Patient: [name]             Date of Birth: 08/11/1941           MRN: [id number]             PCP: Gwenyth Bender, MD Referring: Gwenyth Bender, MD Visit Date: 12/26/2017 Occupation: @GUAROCC @    Subjective:  No chief complaint on file.   History of Present Illness: [name] is a 77 y.o. female ***   Activities of Daily Living:  Patient reports morning stiffness for *** {minute/hour:19697}.   Patient {ACTIONS;DENIES/REPORTS:21021675::"Denies"} nocturnal pain.  Difficulty dressing/grooming: {ACTIONS;DENIES/REPORTS:21021675::"Denies"} Difficulty climbing stairs: {ACTIONS;DENIES/REPORTS:21021675::"Denies"} Difficulty getting out of chair: {ACTIONS;DENIES/REPORTS:21021675::"Denies"} Difficulty using hands for taps, buttons, cutlery, and/or writing: {ACTIONS;DENIES/REPORTS:21021675::"Denies"}   No Rheumatology ROS completed.   PMFS History:  Patient Active Problem List   Diagnosis Date Noted  . On prednisone therapy 05/04/2017  . Chronic left shoulder pain 01/12/2017  . Hx of cardiac cath 12/18/2016  . Chronic pain of right knee 12/07/2016  . Primary osteoarthritis of both hands 12/07/2016  . Chronic right shoulder pain 12/07/2016  . History of osteopenia 12/07/2016  . 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 Atrial fibrillation (HCC) 10/24/2016  . OSA (obstructive sleep apnea) 05/15/2008  . HYPOTHYROIDISM 04/03/2007  . Diabetes mellitus (HCC) 04/03/2007  . HYPERLIPIDEMIA 04/03/2007  . ASTHMA 04/03/2007  . GERD 04/03/2007  . Primary osteoarthritis of right knee 04/03/2007  . DDD (degenerative disc disease), lumbar 04/03/2007  . DDD (degenerative disc disease), cervical 04/03/2007  . Fibromyalgia 04/03/2007  . 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 TACHYCARDIA, HX OF 04/03/2007  . STATUS, KNEE JOINT REPLACEMENT 04/03/2007    Past Medical History:  Diagnosis Date  . Asthma   . Bronchitis   . COPD (chronic obstructive  pulmonary disease) (HCC)   . Diabetes mellitus   . Fibromyalgia   . Hyperlipidemia   . Hypertension   . Osteoporosis   . Rheumatoid aortitis   . Sleep apnea     Family History  Problem Relation Age of Onset  . Diabetes Mother   . Heart failure Mother   . Cancer Mother   . Heart failure Father   . Cancer Sister   . Cancer Sister   . Cancer Brother   . Multiple sclerosis Son    Past Surgical History:  Procedure Laterality Date  . JOINT REPLACEMENT     left total knee  . TOTAL KNEE ARTHROPLASTY     Social History   Social History Narrative  . Not on file     Objective: Vital Signs: There were no vitals taken for this visit.   Physical Exam   Musculoskeletal Exam: ***  CDAI Exam: No CDAI exam completed.    Investigation: No additional findings. CBC Latest Ref Rng & Units 01/06/2012 01/03/2012 10/01/2010  WBC 4.0 - 10.5 K/uL 8.8 - -  Hemoglobin 12.0 - 15.0 g/dL 10/03/2010 93.8 18.2  Hematocrit 36.0 - 46.0 % 39.3 43.0 42.0  Platelets 150 - 400 K/uL 245 - -   CMP Latest Ref Rng & Units 01/06/2012 01/03/2012 10/01/2010  Glucose 70 - 99 mg/dL 10/03/2010) 716(R) 678(L)  BUN 6 - 23 mg/dL 10 13 20   Creatinine 0.50 - 1.10 mg/dL 381(O ) 1.3(H)  Sodium 135 - 145 mEq/L 139 144 140  Potassium 3.5 - 5.1 mEq/L 3.1(L) 3.3(L) 4.2  Chloride 96 - 112 mEq/L 101 105 107  CO2 19 - 32 mEq/L  26 - -  Calcium 8.4 - 10.5 mg/dL 9.6 - -  Total Protein 6.0 - 8.3 g/dL 7.5 - -  Total Bilirubin 0.3 - 1.2 mg/dL 0.4 - -  Alkaline Phos 39 - 117 U/L 86 - -  AST 0 - 37 U/L 18 - -  ALT 0 - 35 U/L 12 - -    Imaging: No results found.  Speciality Comments: No specialty comments available.    Procedures:  No procedures performed Allergies: Codeine; Darvon [propoxyphene hcl]; Fentanyl; Oxycodone hcl; Propoxyphene n-acetaminophen; Valium [diazepam]; and Valsartan   Assessment / Plan:     Visit Diagnoses: No diagnosis found.    Orders: No orders of the defined types were placed in this  encounter.  No orders of the defined types were placed in this encounter.   Face-to-face time spent with patient was *** minutes. 50% of time was spent in counseling and coordination of care.  Follow-Up Instructions: No follow-ups on file.   Gearldine Bienenstock, PA-C  Note - This record has been created using Dragon software.  Chart creation errors have been sought, but may not always  have been located. Such creation errors do not reflect on  the standard of medical care.

## 2017-12-26 ENCOUNTER — Ambulatory Visit: Payer: Medicare Other | Admitting: Physician Assistant

## 2017-12-26 DIAGNOSIS — Z8709 Personal history of other diseases of the respiratory system: Secondary | ICD-10-CM | POA: Insufficient documentation

## 2017-12-26 DIAGNOSIS — Z8639 Personal history of other endocrine, nutritional and metabolic disease: Secondary | ICD-10-CM | POA: Insufficient documentation

## 2017-12-26 DIAGNOSIS — Z8719 Personal history of other diseases of the digestive system: Secondary | ICD-10-CM | POA: Insufficient documentation

## 2017-12-26 DIAGNOSIS — Z8669 Personal history of other diseases of the nervous system and sense organs: Secondary | ICD-10-CM | POA: Insufficient documentation

## 2017-12-26 DIAGNOSIS — Z8679 Personal history of other diseases of the circulatory system: Secondary | ICD-10-CM | POA: Insufficient documentation

## 2017-12-26 NOTE — Progress Notes (Deleted)
Office Visit Note  Patient: [name]             Date of Birth: 1941-02-06           MRN: [id number]             PCP: Gwenyth Bender, MD Referring: Gwenyth Bender, MD Visit Date: 12/28/2017 Occupation: @GUAROCC @    Subjective:  No chief complaint on file.   History of Present Illness: [name] is a 77 y.o. female ***   Activities of Daily Living:  Patient reports morning stiffness for *** {minute/hour:19697}.   Patient {ACTIONS;DENIES/REPORTS:21021675::"Denies"} nocturnal pain.  Difficulty dressing/grooming: {ACTIONS;DENIES/REPORTS:21021675::"Denies"} Difficulty climbing stairs: {ACTIONS;DENIES/REPORTS:21021675::"Denies"} Difficulty getting out of chair: {ACTIONS;DENIES/REPORTS:21021675::"Denies"} Difficulty using hands for taps, buttons, cutlery, and/or writing: {ACTIONS;DENIES/REPORTS:21021675::"Denies"}   No Rheumatology ROS completed.   PMFS History:  Patient Active Problem List   Diagnosis Date Noted  . History of hyperlipidemia 12/26/2017  . History of atrial fibrillation 12/26/2017  . History of diabetes mellitus 12/26/2017  . History of hypothyroidism 12/26/2017  . History of sleep apnea 12/26/2017  . History of gastroesophageal reflux (GERD) 12/26/2017  . History of asthma 12/26/2017  . On prednisone therapy 05/04/2017  . Chronic left shoulder pain 01/12/2017  . Hx of cardiac cath 12/18/2016  . Chronic pain of right knee 12/07/2016  . Primary osteoarthritis of both hands 12/07/2016  . Chronic right shoulder pain 12/07/2016  . History of osteopenia 12/07/2016  . 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 Atrial fibrillation (HCC) 10/24/2016  . OSA (obstructive sleep apnea) 05/15/2008  . HYPOTHYROIDISM 04/03/2007  . Diabetes mellitus (HCC) 04/03/2007  . HYPERLIPIDEMIA 04/03/2007  . ASTHMA 04/03/2007  . GERD 04/03/2007  . Primary osteoarthritis of right knee 04/03/2007  . DDD (degenerative disc disease), lumbar 04/03/2007  .  DDD (degenerative disc disease), cervical 04/03/2007  . Fibromyalgia 04/03/2007  . 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 TACHYCARDIA, HX OF 04/03/2007  . STATUS, KNEE JOINT REPLACEMENT 04/03/2007    Past Medical History:  Diagnosis Date  . Asthma   . Bronchitis   . COPD (chronic obstructive pulmonary disease) (HCC)   . Diabetes mellitus   . Fibromyalgia   . Hyperlipidemia   . Hypertension   . Osteoporosis   . Rheumatoid aortitis   . Sleep apnea     Family History  Problem Relation Age of Onset  . Diabetes Mother   . Heart failure Mother   . Cancer Mother   . Heart failure Father   . Cancer Sister   . Cancer Sister   . Cancer Brother   . Multiple sclerosis Son    Past Surgical History:  Procedure Laterality Date  . JOINT REPLACEMENT     left total knee  . TOTAL KNEE ARTHROPLASTY     Social History   Social History Narrative  . Not on file     Objective: Vital Signs: There were no vitals taken for this visit.   Physical Exam   Musculoskeletal Exam: ***  CDAI Exam: No CDAI exam completed.    Investigation: No additional findings. CBC Latest Ref Rng & Units 01/06/2012 01/03/2012 10/01/2010  WBC 4.0 - 10.5 K/uL 8.8 - -  Hemoglobin 12.0 - 15.0 g/dL 10/03/2010 37.3 42.8  Hematocrit 36.0 - 46.0 % 39.3 43.0 42.0  Platelets 150 - 400 K/uL 245 - -   CMP Latest Ref Rng & Units 01/06/2012 01/03/2012 10/01/2010  Glucose 70 - 99 mg/dL 10/03/2010) 115(B) 262(M)  BUN 6 -  23 mg/dL 10 13 20   Creatinine 0.50 - 1.10 mg/dL 9.37) 1.3(H)  Sodium 135 - 145 mEq/L 139 144 140  Potassium 3.5 - 5.1 mEq/L 3.1(L) 3.3(L) 4.2  Chloride 96 - 112 mEq/L 101 105 107  CO2 19 - 32 mEq/L 26 - -  Calcium 8.4 - 10.5 mg/dL 9.6 - -  Total Protein 6.0 - 8.3 g/dL 7.5 - -  Total Bilirubin 0.3 - 1.2 mg/dL 0.4 - -  Alkaline Phos 39 - 117 U/L 86 - -  AST 0 - 37 U/L 18 - -  ALT 0 - 35 U/L 12 - -    Imaging: No results found.  Speciality Comments: No specialty comments available.    Procedures:  No  procedures performed Allergies: Codeine; Darvon [propoxyphene hcl]; Fentanyl; Oxycodone hcl; Propoxyphene n-acetaminophen; Valium [diazepam]; and Valsartan   Assessment / Plan:     Visit Diagnoses: Primary osteoarthritis of both hands  Primary osteoarthritis of right knee  History of total knee replacement, left  Fibromyalgia  DDD (degenerative disc disease), cervical  DDD (degenerative disc disease), lumbar  Positive TB test  History of hyperlipidemia  History of atrial fibrillation  History of diabetes mellitus  History of hypothyroidism  History of sleep apnea  History of gastroesophageal reflux (GERD)  History of asthma    Orders: No orders of the defined types were placed in this encounter.  No orders of the defined types were placed in this encounter.   Face-to-face time spent with patient was *** minutes. 50% of time was spent in counseling and coordination of care.  Follow-Up Instructions: No follow-ups on file.   9.02(I, PA-C  Note - This record has been created using Dragon software.  Chart creation errors have been sought, but may not always  have been located. Such creation errors do not reflect on  the standard of medical care.

## 2017-12-27 ENCOUNTER — Ambulatory Visit: Payer: Medicare Other

## 2017-12-28 ENCOUNTER — Ambulatory Visit: Payer: Medicare Other | Admitting: Physician Assistant

## 2017-12-29 NOTE — Progress Notes (Signed)
Office Visit Note  Patient: [name]             Date of Birth: 1941/09/15           MRN: [id number]             PCP: Gwenyth Bender, MD Referring: Gwenyth Bender, MD Visit Date: 01/04/2018 Occupation: @GUAROCC @    Subjective:  Pain in the right knee and right hip.   History of Present Illness: [name] is a 77 y.o. female with history of osteoarthritis and fibromyalgia syndrome.  She states she has been having pain and discomfort in her right knee and the right hip which she describes over the trochanteric area.  Has been having discomfort for 1 month now which is gradually getting worse.  Her left total knee replacement is doing well.  She has some discomfort in her C-spine and lumbar spine.  She has some generalized pain and discomfort from fibromyalgia.  Activities of Daily Living:  Patient reports morning stiffness for 24 hours.   Patient Reports nocturnal pain.  Difficulty dressing/grooming: Reports Difficulty climbing stairs: Reports Difficulty getting out of chair: Reports Difficulty using hands for taps, buttons, cutlery, and/or writing: Denies   Review of Systems  Constitutional: Positive for fatigue. Negative for fever.  HENT: Negative for ear pain.   Eyes: Negative for pain.  Respiratory: Negative for cough and shortness of breath.   Cardiovascular: Negative for swelling in legs/feet.  Gastrointestinal: Negative for constipation and diarrhea.  Genitourinary: Negative for difficulty urinating.  Musculoskeletal: Positive for arthralgias, joint pain, myalgias, muscle weakness and myalgias. Negative for joint swelling.  Skin: Negative for rash, hair loss and sensitivity to sunlight.  Neurological: Negative for dizziness, numbness, headaches and weakness.  Hematological: Negative for bruising/bleeding tendency.  Psychiatric/Behavioral: Negative for depressed mood and sleep disturbance.    PMFS History:  Patient Active Problem List   Diagnosis Date  Noted  . History of hyperlipidemia 12/26/2017  . History of atrial fibrillation 12/26/2017  . History of diabetes mellitus 12/26/2017  . History of hypothyroidism 12/26/2017  . History of sleep apnea 12/26/2017  . History of gastroesophageal reflux (GERD) 12/26/2017  . History of asthma 12/26/2017  . On prednisone therapy 05/04/2017  . Chronic left shoulder pain 01/12/2017  . Hx of cardiac cath 12/18/2016  . Chronic pain of right knee 12/07/2016  . Primary osteoarthritis of both hands 12/07/2016  . Chronic right shoulder pain 12/07/2016  . History of osteopenia 12/07/2016  . 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 Atrial fibrillation (HCC) 10/24/2016  . OSA (obstructive sleep apnea) 05/15/2008  . HYPOTHYROIDISM 04/03/2007  . Diabetes mellitus (HCC) 04/03/2007  . HYPERLIPIDEMIA 04/03/2007  . ASTHMA 04/03/2007  . GERD 04/03/2007  . Primary osteoarthritis of right knee 04/03/2007  . DDD (degenerative disc disease), lumbar 04/03/2007  . DDD (degenerative disc disease), cervical 04/03/2007  . Fibromyalgia 04/03/2007  . 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 TACHYCARDIA, HX OF 04/03/2007  . STATUS, KNEE JOINT REPLACEMENT 04/03/2007    Past Medical History:  Diagnosis Date  . Asthma   . Bronchitis   . COPD (chronic obstructive pulmonary disease) (HCC)   . Diabetes mellitus   . Fibromyalgia   . Hyperlipidemia   . Hypertension   . Osteoporosis   . Rheumatoid aortitis   . Sleep apnea     Family History  Problem Relation Age of Onset  . Diabetes Mother   . Heart failure Mother   .  Cancer Mother   . Heart failure Father   . Cancer Sister   . Cancer Sister   . Cancer Brother   . Multiple sclerosis Son    Past Surgical History:  Procedure Laterality Date  . JOINT REPLACEMENT     left total knee  . TOTAL KNEE ARTHROPLASTY     Social History   Social History Narrative  . Not on file     Objective: Vital Signs: BP 110/60   Resp 16   Ht 5\' 2"  (1.575 m)   Wt  182 lb (82.6 kg)   BMI 33.29 kg/m    Physical Exam  Constitutional: She is oriented to person, place, and time. She appears well-developed and well-nourished.  HENT:  Head: Normocephalic and atraumatic.  Eyes: Conjunctivae and EOM are normal.  Neck: Normal range of motion.  Cardiovascular: Normal rate, regular rhythm, normal heart sounds and intact distal pulses.  Pulmonary/Chest: Effort normal and breath sounds normal.  Abdominal: Soft. Bowel sounds are normal.  Lymphadenopathy:    She has no cervical adenopathy.  Neurological: She is alert and oriented to person, place, and time.  Skin: Skin is warm and dry. Capillary refill takes less than 2 seconds.  Psychiatric: She has a normal mood and affect. Her behavior is normal.  Nursing note and vitals reviewed.    Musculoskeletal Exam: She had limited range of motion of her C-spine thoracic lumbar spine.  Shoulder joints and elbow joints are good range of motion.  DIP and PIP thickening with no synovitis.  She has tenderness on palpation of her right trochanteric bursa consistent with trochanteric bursitis.  She has crepitus and discomfort range of motion of her right knee.  Left knee joint is replaced which is doing well.  She had generalized hyperalgesia from fibromyalgia.  CDAI Exam: No CDAI exam completed.    Investigation: No additional findings.   Imaging: No results found.  Speciality Comments: No specialty comments available.    Procedures:  Large Joint Inj: R greater trochanter on 01/04/2018 2:36 PM Indications: pain Details: 27 G 1.5 in needle, lateral approach  Arthrogram: No  Medications: 1.5 mL lidocaine (PF) 1 %; 60 mg triamcinolone acetonide 40 MG/ML Aspirate: 0 mL Outcome: tolerated well, no immediate complications Procedure, treatment alternatives, risks and benefits explained, specific risks discussed. Consent was given by the patient. Immediately prior to procedure a time out was called to verify the  correct patient, procedure, equipment, support staff and site/side marked as required. Patient was prepped and draped in the usual sterile fashion.     Allergies: Codeine; Darvon [propoxyphene hcl]; Fentanyl; Oxycodone hcl; Propoxyphene n-acetaminophen; Valium [diazepam]; and Valsartan   Assessment / Plan:     Visit Diagnoses: Polyarthralgia - Positive RF, negative CCP, no history of synovitis.  Patient had no synovitis on examination.  Primary osteoarthritis of both hands:  Primary osteoarthritis of right knee - No response to Visco supplement injections cortisone injection in her knee joint which we plan to do next week she is diabetic.  History of total knee replacement, left: Doing well  Fibromyalgia: She continues to have pain and discomfort in multiple joints.  DDD (degenerative disc disease), cervical: Has limited range of motion.  DDD (degenerative disc disease), lumbar: She has limited range of motion.  Other chronic pain - tramadol UDS: 8/7/2018Narc agreement: 01/29/2017.  She states she has a lot of discomfort despite taking tramadol.  I offered pain management referral but she declined.  Positive TB test  History of gastroesophageal  reflux (GERD)  History of hyperlipidemia  History of diabetes mellitus: She has been advised to monitor blood sugar closely after cortisone injection.  History of atrial fibrillation  History of hypothyroidism  History of asthma  History of sleep apnea  Trochanteric bursitis, right hip -she has been having a lot of pain and discomfort and nocturnal pain.  After different treatment options were discussed per request right trochanteric bursa was injected with cortisone as described above.  Plan: Large Joint Inj: R greater trochanter    Orders: Orders Placed This Encounter  Procedures  . Large Joint Inj: R greater trochanter   No orders of the defined types were placed in this encounter.   Face-to-face time spent with patient was  . Greater than 50% of time was spent in counseling and coordination of care.  Follow-Up Instructions: Return in about 6 months (around 07/06/2018) for Osteoarthritis,DDD, FMS, Osteoarthritis.   Pollyann Savoy, MD  Note - This record has been created using Animal nutritionist.  Chart creation errors have been sought, but may not always  have been located. Such creation errors do not reflect on  the standard of medical care.

## 2018-01-04 ENCOUNTER — Ambulatory Visit (INDEPENDENT_AMBULATORY_CARE_PROVIDER_SITE_OTHER): Payer: Medicare Other | Admitting: Rheumatology

## 2018-01-04 ENCOUNTER — Encounter: Payer: Self-pay | Admitting: Rheumatology

## 2018-01-04 VITALS — BP 110/60 | Resp 16 | Ht 62.0 in | Wt 182.0 lb

## 2018-01-04 DIAGNOSIS — R7611 Nonspecific reaction to tuberculin skin test without active tuberculosis: Secondary | ICD-10-CM

## 2018-01-04 DIAGNOSIS — M5030 Other cervical disc degeneration, unspecified cervical region: Secondary | ICD-10-CM | POA: Diagnosis not present

## 2018-01-04 DIAGNOSIS — Z8639 Personal history of other endocrine, nutritional and metabolic disease: Secondary | ICD-10-CM

## 2018-01-04 DIAGNOSIS — Z8679 Personal history of other diseases of the circulatory system: Secondary | ICD-10-CM

## 2018-01-04 DIAGNOSIS — M2550 Pain in unspecified joint: Secondary | ICD-10-CM

## 2018-01-04 DIAGNOSIS — M19041 Primary osteoarthritis, right hand: Secondary | ICD-10-CM

## 2018-01-04 DIAGNOSIS — M5136 Other intervertebral disc degeneration, lumbar region: Secondary | ICD-10-CM

## 2018-01-04 DIAGNOSIS — M1711 Unilateral primary osteoarthritis, right knee: Secondary | ICD-10-CM

## 2018-01-04 DIAGNOSIS — M19042 Primary osteoarthritis, left hand: Secondary | ICD-10-CM

## 2018-01-04 DIAGNOSIS — Z96652 Presence of left artificial knee joint: Secondary | ICD-10-CM | POA: Diagnosis not present

## 2018-01-04 DIAGNOSIS — Z8669 Personal history of other diseases of the nervous system and sense organs: Secondary | ICD-10-CM

## 2018-01-04 DIAGNOSIS — Z8719 Personal history of other diseases of the digestive system: Secondary | ICD-10-CM | POA: Diagnosis not present

## 2018-01-04 DIAGNOSIS — M797 Fibromyalgia: Secondary | ICD-10-CM | POA: Diagnosis not present

## 2018-01-04 DIAGNOSIS — M7061 Trochanteric bursitis, right hip: Secondary | ICD-10-CM | POA: Diagnosis not present

## 2018-01-04 DIAGNOSIS — Z8709 Personal history of other diseases of the respiratory system: Secondary | ICD-10-CM

## 2018-01-04 DIAGNOSIS — G8929 Other chronic pain: Secondary | ICD-10-CM

## 2018-01-04 MED ORDER — LIDOCAINE HCL (PF) 1 % IJ SOLN
1.5000 mL | INTRAMUSCULAR | Status: AC | PRN
  Administered 2018-01-04: 1.5 mL

## 2018-01-04 MED ORDER — TRIAMCINOLONE ACETONIDE 40 MG/ML IJ SUSP
60.0000 mg | INTRAMUSCULAR | Status: AC | PRN
  Administered 2018-01-04: 60 mg via INTRA_ARTICULAR

## 2018-01-12 ENCOUNTER — Ambulatory Visit
Admission: RE | Admit: 2018-01-12 | Discharge: 2018-01-12 | Disposition: A | Discharge status: Home / Self Care | Payer: Medicare Other | Source: Ambulatory Visit | Attending: Internal Medicine | Admitting: Internal Medicine

## 2018-01-12 DIAGNOSIS — Z1231 Encounter for screening mammogram for malignant neoplasm of breast: Secondary | ICD-10-CM

## 2018-01-18 ENCOUNTER — Ambulatory Visit: Payer: Medicare Other | Admitting: Rheumatology

## 2018-01-22 ENCOUNTER — Ambulatory Visit: Payer: Medicare Other | Admitting: Rheumatology

## 2018-02-15 ENCOUNTER — Ambulatory Visit: Payer: Medicare Other | Admitting: Rheumatology

## 2018-02-20 ENCOUNTER — Encounter: Payer: Self-pay | Admitting: Physician Assistant

## 2018-02-20 ENCOUNTER — Ambulatory Visit (INDEPENDENT_AMBULATORY_CARE_PROVIDER_SITE_OTHER): Payer: Medicare Other | Admitting: Physician Assistant

## 2018-02-20 VITALS — BP 128/77 | HR 85 | Resp 18 | Ht 62.5 in | Wt 175.0 lb

## 2018-02-20 DIAGNOSIS — M797 Fibromyalgia: Secondary | ICD-10-CM

## 2018-02-20 DIAGNOSIS — Z8709 Personal history of other diseases of the respiratory system: Secondary | ICD-10-CM

## 2018-02-20 DIAGNOSIS — Z8719 Personal history of other diseases of the digestive system: Secondary | ICD-10-CM

## 2018-02-20 DIAGNOSIS — Z8669 Personal history of other diseases of the nervous system and sense organs: Secondary | ICD-10-CM

## 2018-02-20 DIAGNOSIS — M7061 Trochanteric bursitis, right hip: Secondary | ICD-10-CM

## 2018-02-20 DIAGNOSIS — M51369 Other intervertebral disc degeneration, lumbar region without mention of lumbar back pain or lower extremity pain: Secondary | ICD-10-CM

## 2018-02-20 DIAGNOSIS — M5136 Other intervertebral disc degeneration, lumbar region: Secondary | ICD-10-CM

## 2018-02-20 DIAGNOSIS — Z8679 Personal history of other diseases of the circulatory system: Secondary | ICD-10-CM

## 2018-02-20 DIAGNOSIS — Z96652 Presence of left artificial knee joint: Secondary | ICD-10-CM

## 2018-02-20 DIAGNOSIS — M1711 Unilateral primary osteoarthritis, right knee: Secondary | ICD-10-CM

## 2018-02-20 DIAGNOSIS — R7611 Nonspecific reaction to tuberculin skin test without active tuberculosis: Secondary | ICD-10-CM

## 2018-02-20 DIAGNOSIS — Z8639 Personal history of other endocrine, nutritional and metabolic disease: Secondary | ICD-10-CM

## 2018-02-20 DIAGNOSIS — M19042 Primary osteoarthritis, left hand: Principal | ICD-10-CM

## 2018-02-20 DIAGNOSIS — M5030 Other cervical disc degeneration, unspecified cervical region: Secondary | ICD-10-CM

## 2018-02-20 DIAGNOSIS — M19041 Primary osteoarthritis, right hand: Secondary | ICD-10-CM

## 2018-02-20 DIAGNOSIS — G8929 Other chronic pain: Secondary | ICD-10-CM

## 2018-02-20 MED ORDER — LIDOCAINE HCL 1 % IJ SOLN
1.0000 mL | INTRAMUSCULAR | Status: AC | PRN
  Administered 2018-02-20: 1 mL

## 2018-02-20 MED ORDER — TRIAMCINOLONE ACETONIDE 40 MG/ML IJ SUSP
40.0000 mg | INTRAMUSCULAR | Status: AC | PRN
  Administered 2018-02-20: 40 mg via INTRA_ARTICULAR

## 2018-02-20 NOTE — Patient Instructions (Signed)
Knee Exercises Ask your health care provider which exercises are safe for you. Do exercises exactly as told by your health care provider and adjust them as directed. It is normal to feel mild stretching, pulling, tightness, or discomfort as you do these exercises, but you should stop right away if you feel sudden pain or your pain gets worse.Do not begin these exercises until told by your health care provider. STRETCHING AND RANGE OF MOTION EXERCISES These exercises warm up your muscles and joints and improve the movement and flexibility of your knee. These exercises also help to relieve pain, numbness, and tingling. Exercise A: Knee Extension, Prone 1. Lie on your abdomen on a bed. 2. Place your left / right knee just beyond the edge of the surface so your knee is not on the bed. You can put a towel under your left / right thigh just above your knee for comfort. 3. Relax your leg muscles and allow gravity to straighten your knee. You should feel a stretch behind your left / right knee. 4. Hold this position for __________ seconds. 5. Scoot up so your knee is supported between repetitions. Repeat __________ times. Complete this stretch __________ times a day. Exercise B: Knee Flexion, Active  1. Lie on your back with both knees straight. If this causes back discomfort, bend your left / right knee so your foot is flat on the floor. 2. Slowly slide your left / right heel back toward your buttocks until you feel a gentle stretch in the front of your knee or thigh. 3. Hold this position for __________ seconds. 4. Slowly slide your left / right heel back to the starting position. Repeat __________ times. Complete this exercise __________ times a day. Exercise C: Quadriceps, Prone  1. Lie on your abdomen on a firm surface, such as a bed or padded floor. 2. Bend your left / right knee and hold your ankle. If you cannot reach your ankle or pant leg, loop a belt around your foot and grab the belt  instead. 3. Gently pull your heel toward your buttocks. Your knee should not slide out to the side. You should feel a stretch in the front of your thigh and knee. 4. Hold this position for __________ seconds. Repeat __________ times. Complete this stretch __________ times a day. Exercise D: Hamstring, Supine 1. Lie on your back. 2. Loop a belt or towel over the ball of your left / right foot. The ball of your foot is on the walking surface, right under your toes. 3. Straighten your left / right knee and slowly pull on the belt to raise your leg until you feel a gentle stretch behind your knee. ? Do not let your left / right knee bend while you do this. ? Keep your other leg flat on the floor. 4. Hold this position for __________ seconds. Repeat __________ times. Complete this stretch __________ times a day. STRENGTHENING EXERCISES These exercises build strength and endurance in your knee. Endurance is the ability to use your muscles for a long time, even after they get tired. Exercise E: Quadriceps, Isometric  1. Lie on your back with your left / right leg extended and your other knee bent. Put a rolled towel or small pillow under your knee if told by your health care provider. 2. Slowly tense the muscles in the front of your left / right thigh. You should see your kneecap slide up toward your hip or see increased dimpling just above the knee. This   motion will push the back of the knee toward the floor. 3. For __________ seconds, keep the muscle as tight as you can without increasing your pain. 4. Relax the muscles slowly and completely. Repeat __________ times. Complete this exercise __________ times a day. Exercise F: Straight Leg Raises - Quadriceps 1. Lie on your back with your left / right leg extended and your other knee bent. 2. Tense the muscles in the front of your left / right thigh. You should see your kneecap slide up or see increased dimpling just above the knee. Your thigh may  even shake a bit. 3. Keep these muscles tight as you raise your leg 4-6 inches (10-15 cm) off the floor. Do not let your knee bend. 4. Hold this position for __________ seconds. 5. Keep these muscles tense as you lower your leg. 6. Relax your muscles slowly and completely after each repetition. Repeat __________ times. Complete this exercise __________ times a day. Exercise G: Hamstring, Isometric 1. Lie on your back on a firm surface. 2. Bend your left / right knee approximately __________ degrees. 3. Dig your left / right heel into the surface as if you are trying to pull it toward your buttocks. Tighten the muscles in the back of your thighs to dig as hard as you can without increasing any pain. 4. Hold this position for __________ seconds. 5. Release the tension gradually and allow your muscles to relax completely for __________ seconds after each repetition. Repeat __________ times. Complete this exercise __________ times a day. Exercise H: Hamstring Curls  If told by your health care provider, do this exercise while wearing ankle weights. Begin with __________ weights. Then increase the weight by 1 lb (0.5 kg) increments. Do not wear ankle weights that are more than __________. 1. Lie on your abdomen with your legs straight. 2. Bend your left / right knee as far as you can without feeling pain. Keep your hips flat against the floor. 3. Hold this position for __________ seconds. 4. Slowly lower your leg to the starting position.  Repeat __________ times. Complete this exercise __________ times a day. Exercise I: Squats (Quadriceps) 1. Stand in front of a table, with your feet and knees pointing straight ahead. You may rest your hands on the table for balance but not for support. 2. Slowly bend your knees and lower your hips like you are going to sit in a chair. ? Keep your weight over your heels, not over your toes. ? Keep your lower legs upright so they are parallel with the table  legs. ? Do not let your hips go lower than your knees. ? Do not bend lower than told by your health care provider. ? If your knee pain increases, do not bend as low. 3. Hold the squat position for __________ seconds. 4. Slowly push with your legs to return to standing. Do not use your hands to pull yourself to standing. Repeat __________ times. Complete this exercise __________ times a day. Exercise J: Wall Slides (Quadriceps)  1. Lean your back against a smooth wall or door while you walk your feet out 18-24 inches (46-61 cm) from it. 2. Place your feet hip-width apart. 3. Slowly slide down the wall or door until your knees bend __________ degrees. Keep your knees over your heels, not over your toes. Keep your knees in line with your hips. 4. Hold for __________ seconds. Repeat __________ times. Complete this exercise __________ times a day. Exercise K: Straight Leg Raises -   Hip Abductors 1. Lie on your side with your left / right leg in the top position. Lie so your head, shoulder, knee, and hip line up. You may bend your bottom knee to help you keep your balance. 2. Roll your hips slightly forward so your hips are stacked directly over each other and your left / right knee is facing forward. 3. Leading with your heel, lift your top leg 4-6 inches (10-15 cm). You should feel the muscles in your outer hip lifting. ? Do not let your foot drift forward. ? Do not let your knee roll toward the ceiling. 4. Hold this position for __________ seconds. 5. Slowly return your leg to the starting position. 6. Let your muscles relax completely after each repetition. Repeat __________ times. Complete this exercise __________ times a day. Exercise L: Straight Leg Raises - Hip Extensors 1. Lie on your abdomen on a firm surface. You can put a pillow under your hips if that is more comfortable. 2. Tense the muscles in your buttocks and lift your left / right leg about 4-6 inches (10-15 cm). Keep your knee  straight as you lift your leg. 3. Hold this position for __________ seconds. 4. Slowly lower your leg to the starting position. 5. Let your leg relax completely after each repetition. Repeat __________ times. Complete this exercise __________ times a day. This information is not intended to replace advice given to you by your health care provider. Make sure you discuss any questions you have with your health care provider. Document Released: 08/03/2005 Document Revised: 06/13/2016 Document Reviewed: 07/26/2015 [employer] Interactive Patient Education  2018 [employer] Trochanteric Bursitis Rehab Ask your health care provider which exercises are safe for you. Do exercises exactly as told by your health care provider and adjust them as directed. It is normal to feel mild stretching, pulling, tightness, or discomfort as you do these exercises, but you should stop right away if you feel sudden pain or your pain gets worse.Do not begin these exercises until told by your health care provider. Stretching exercises These exercises warm up your muscles and joints and improve the movement and flexibility of your hip. These exercises also help to relieve pain and stiffness. Exercise A: Iliotibial band stretch  1. Lie on your side with your left / right leg in the top position. 2. Bend your left / right knee and grab your ankle. 3. Slowly bring your knee back so your thigh is behind your body. 4. Slowly lower your knee toward the floor until you feel a gentle stretch on the outside of your left / right thigh. If you do not feel a stretch and your knee will not fall farther, place the heel of your other foot on top of your outer knee and pull your thigh down farther. 5. Hold this position for __________ seconds. 6. Slowly return to the starting position. Repeat __________ times. Complete this exercise __________ times a day. Strengthening exercises These exercises build strength and endurance in your hip  and pelvis. Endurance is the ability to use your muscles for a long time, even after they get tired. Exercise B: Bridge ( hip extensors) 1. Lie on your back on a firm surface with your knees bent and your feet flat on the floor. 2. Tighten your buttocks muscles and lift your buttocks off the floor until your trunk is level with your thighs. You should feel the muscles working in your buttocks and the back of your thighs. If this exercise is  too easy, try doing it with your arms crossed over your chest. 3. Hold this position for __________ seconds. 4. Slowly return to the starting position. 5. Let your muscles relax completely between repetitions. Repeat __________ times. Complete this exercise __________ times a day. Exercise C: Squats ( knee extensors and  quadriceps) 1. Stand in front of a table, with your feet and knees pointing straight ahead. You may rest your hands on the table for balance but not for support. 2. Slowly bend your knees and lower your hips like you are going to sit in a chair. ? Keep your weight over your heels, not over your toes. ? Keep your lower legs upright so they are parallel with the table legs. ? Do not let your hips go lower than your knees. ? Do not bend lower than told by your health care provider. ? If your hip pain increases, do not bend as low. 3. Hold this position for __________ seconds. 4. Slowly push with your legs to return to standing. Do not use your hands to pull yourself to standing. Repeat __________ times. Complete this exercise __________ times a day. Exercise D: Hip hike 1. Stand sideways on a bottom step. Stand on your left / right leg with your other foot unsupported next to the step. You can hold onto the railing or wall if needed for balance. 2. Keeping your knees straight and your torso square, lift your left / right hip up toward the ceiling. 3. Hold this position for __________ seconds. 4. Slowly let your left / right hip lower toward  the floor, past the starting position. Your foot should get closer to the floor. Do not lean or bend your knees. Repeat __________ times. Complete this exercise __________ times a day. Exercise E: Single leg stand 1. Stand near a counter or door frame that you can hold onto for balance as needed. It is helpful to stand in front of a mirror for this exercise so you can watch your hip. 2. Squeeze your left / right buttock muscles then lift up your other foot. Do not let your left / right hip push out to the side. 3. Hold this position for __________ seconds. Repeat __________ times. Complete this exercise __________ times a day. This information is not intended to replace advice given to you by your health care provider. Make sure you discuss any questions you have with your health care provider. Document Released: 10/27/2004 Document Revised: 05/26/2016 Document Reviewed: 09/04/2015 [employer] Interactive Patient Education  [employer].

## 2018-02-20 NOTE — Progress Notes (Signed)
Office Visit Note  Patient: [name]             Date of Birth: Feb 19, 1941           MRN: [id number]             PCP: Gwenyth Bender, MD Referring: Gwenyth Bender, MD Visit Date: 02/20/2018 Occupation: @GUAROCC @    Subjective:  Right hip pain and Right knee pain   History of Present Illness: [name] is a 77 y.o. female with history of osteoarthritis, fibromyalgia, and DDD.  She reports that her fibromyalgia has been stable.  She has some generalized muscle aches.  Her fatigue has also been stable and her insomnia has improved.  She presents today with right knee pain and swelling that started about 1 month ago.  She states she also is having right trochanteric bursitis.  Her last cortisone injection was on 01/04/2018 which provided relief for about 2 to 3 weeks.  She states that she is going to graduation on Thursday and will be walking more than usual.  She reports her left knee replacement is doing well.  She walks with a cane.  She denies any injuries or falls recently.  She denies any right knee mechanical symptoms. She has tried resting her knee, which has helped but she has not tried ice or elevation.  She has been taking tramadol for pain relief.  She denies any hand pain at this time.  She continues to have chronic shoulder pain.  She states her bilateral elbow joints have been causing discomfort.  She has occasional discomfort in her lower back.  She denies any neck pain.    Activities of Daily Living:  Patient reports morning stiffness for 2  hours.   Patient Reports nocturnal pain.  Difficulty dressing/grooming: Denies Difficulty climbing stairs: Reports Difficulty getting out of chair: Denies Difficulty using hands for taps, buttons, cutlery, and/or writing: Denies   Review of Systems  Constitutional: Positive for fatigue.  HENT: Negative for mouth sores, trouble swallowing, trouble swallowing, mouth dryness and nose dryness.   Eyes: Positive for dryness  (Uses Restasis). Negative for pain and visual disturbance.  Respiratory: Negative for cough, hemoptysis, shortness of breath and difficulty breathing.   Cardiovascular: Negative for chest pain, palpitations, hypertension and swelling in legs/feet.  Gastrointestinal: Negative for blood in stool, constipation and diarrhea.  Endocrine: Negative for increased urination.  Genitourinary: Negative for painful urination.  Musculoskeletal: Positive for arthralgias, joint pain, joint swelling, myalgias, morning stiffness and myalgias. Negative for muscle weakness and muscle tenderness.  Skin: Negative for color change, pallor, rash, hair loss, nodules/bumps, skin tightness, ulcers and sensitivity to sunlight.  Allergic/Immunologic: Negative for susceptible to infections.  Neurological: Negative for dizziness, numbness, headaches and weakness.  Hematological: Negative for swollen glands.  Psychiatric/Behavioral: Negative for depressed mood and sleep disturbance. The patient is not nervous/anxious.     PMFS History:  Patient Active Problem List   Diagnosis Date Noted  . History of hyperlipidemia 12/26/2017  . History of atrial fibrillation 12/26/2017  . History of diabetes mellitus 12/26/2017  . History of hypothyroidism 12/26/2017  . History of sleep apnea 12/26/2017  . History of gastroesophageal reflux (GERD) 12/26/2017  . History of asthma 12/26/2017  . On prednisone therapy 05/04/2017  . Chronic left shoulder pain 01/12/2017  . Hx of cardiac cath 12/18/2016  . Chronic pain of right knee 12/07/2016  . Primary osteoarthritis of both hands 12/07/2016  . Chronic right shoulder pain  12/07/2016  . History of osteopenia 12/07/2016  . 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 Atrial fibrillation (HCC) 10/24/2016  . OSA (obstructive sleep apnea) 05/15/2008  . HYPOTHYROIDISM 04/03/2007  . Diabetes mellitus (HCC) 04/03/2007  . HYPERLIPIDEMIA 04/03/2007  . ASTHMA 04/03/2007  . GERD  04/03/2007  . Primary osteoarthritis of right knee 04/03/2007  . DDD (degenerative disc disease), lumbar 04/03/2007  . DDD (degenerative disc disease), cervical 04/03/2007  . Fibromyalgia 04/03/2007  . 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 TACHYCARDIA, HX OF 04/03/2007  . STATUS, KNEE JOINT REPLACEMENT 04/03/2007    Past Medical History:  Diagnosis Date  . Asthma   . Bronchitis   . COPD (chronic obstructive pulmonary disease) (HCC)   . Diabetes mellitus   . Fibromyalgia   . Hyperlipidemia   . Hypertension   . Osteoporosis   . Rheumatoid aortitis   . Sleep apnea     Family History  Problem Relation Age of Onset  . Diabetes Mother   . Heart failure Mother   . Cancer Mother   . Heart failure Father   . Cancer Sister   . Cancer Sister   . Cancer Brother   . Multiple sclerosis Son    Past Surgical History:  Procedure Laterality Date  . JOINT REPLACEMENT     left total knee  . KNEE ARTHROPLASTY    . TOTAL KNEE ARTHROPLASTY     Social History   Social History Narrative  . Not on file     Objective: Vital Signs: BP 128/77 (BP Location: Left Arm, Patient Position: Sitting, Cuff Size: Normal)   Pulse 85   Resp 18   Ht 5' 2.5" (1.588 m)   Wt 175 lb (79.4 kg)   BMI 31.50 kg/m    Physical Exam  Constitutional: She is oriented to person, place, and time. She appears well-developed and well-nourished.  HENT:  Head: Normocephalic and atraumatic.  Eyes: Conjunctivae and EOM are normal.  Neck: Normal range of motion.  Cardiovascular: Normal rate, regular rhythm, normal heart sounds and intact distal pulses.  Pulmonary/Chest: Effort normal and breath sounds normal.  Abdominal: Soft. Bowel sounds are normal.  Lymphadenopathy:    She has no cervical adenopathy.  Neurological: She is alert and oriented to person, place, and time.  Skin: Skin is warm and dry. Capillary refill takes less than 2 seconds.  Psychiatric: She has a normal mood and affect. Her behavior is normal.    Nursing note and vitals reviewed.    Musculoskeletal Exam: C-spine limited ROM.  Thoracic and lumbar spine good ROM.  She has tenderness of right SI joint.  Shoulder joints, elbow joints, wrist joints, MCPs, PIPs, and DIPs good ROM with no synovitis.  She has tenderness of bilateral lateral epicondyles.  She has PIP and DIP synovial thickening.  Hip joints, knee joints, ankle joints, MTPs, PIPs, and DIPs good ROM with no synovitis.  She has tenderness of right trochanteric bursa.  No warmth or effusion of knee joints.  She has discomfort with right knee ROM.  Left knee replacement is doing well.    CDAI Exam: No CDAI exam completed.    Investigation: No additional findings.   Imaging: No results found.  Speciality Comments: No specialty comments available.    Procedures:  Large Joint Inj: R greater trochanter on 02/20/2018 4:06 PM Indications: pain Details: 27 G 1.5 in needle, lateral approach  Arthrogram: No  Medications: 1 mL lidocaine 1 %; 40 mg triamcinolone acetonide 40 MG/ML  Aspirate: 0 mL Outcome: tolerated well, no immediate complications Procedure, treatment alternatives, risks and benefits explained, specific risks discussed. Consent was given by the patient. Immediately prior to procedure a time out was called to verify the correct patient, procedure, equipment, support staff and site/side marked as required. Patient was prepped and draped in the usual sterile fashion.     Allergies: Codeine; Darvon [propoxyphene hcl]; Fentanyl; Oxycodone hcl; Propoxyphene n-acetaminophen; Valium [diazepam]; and Valsartan   Assessment / Plan:     Visit Diagnoses: Primary osteoarthritis of both hands: Positive RF, negative CCP no history of synovitis: She has no active synovitis today.  She has PIP and DIP synovial thickening consistent with osteoarthritis of bilateral feet.  Joint protection muscle strengthening were discussed.  Primary osteoarthritis of right knee -She is having  discomfort in her right knee.  She has no warmth or effusion on exam today.  She is walking with a cane.  She has no mechanical symptoms.  No injuries or falls recently.  Her last cortisone injection was 08/17/2017.  She is going to return the office for a right knee cortisone injection in about 1 week.  She had a right trochanteric bursa injection with shin today.  She is advised to monitor her blood pressure closely upon the cortisone injection.  She was given a handout of knee exercises that she can perform at home as well as secondary bursitis exercises.   History of total knee replacement, left: No warmth or effusion.  Her left knee replacement is doing well.  Fibromyalgia: She continues to have generalized muscle aches and muscle tenderness.  Her insomnia has improved and her fatigue has been stable.  She takes ultracet 2 tablets up to 3 times daily as needed for pain relief.  Trochanteric bursitis, right hip - She has tenderness of the right trochanteric bursa on exam today.  Her last right trochanteric bursitis cortisone injection was 01/04/2018 and last about 2-3 weeks.  She has a graduation to attend on Thursday and will like a right trochanteric bursa cortisone injection today.  She tolerated procedure well.  She was advised to monitor her blood pressure and blood glucose closely following the cortisone injection today.  Potential side effects were discussed.  She was given a handout of exercises that she can perform at home.  DDD (degenerative disc disease), cervical: She has limited range of motion of her C-spine with crepitus.  She has no discomfort in her C-spine at this time.  DDD (degenerative disc disease), lumbar: No midline spinal tenderness.  Other chronic pain: She takes Ultracet 2 tablets by mouth three times daily PRN.   Other medical conditions are listed as follows:   Positive TB test  History of gastroesophageal reflux (GERD)  History of hyperlipidemia  History of  diabetes mellitus  History of atrial fibrillation  History of hypothyroidism  History of asthma  History of sleep apnea    Orders: Orders Placed This Encounter  Procedures  . Large Joint Inj   No orders of the defined types were placed in this encounter.   Face-to-face time spent with patient was 30 minutes. >50% of time was spent in counseling and coordination of care.  Follow-Up Instructions: Return in about 6 months (around 08/23/2018) for Osteoarthritis, Fibromyalgia.   Gearldine Bienenstock, PA-C  Note - This record has been created using Dragon software.  Chart creation errors have been sought, but may not always  have been located. Such creation errors do not reflect on  the standard  of medical care.

## 2018-02-27 ENCOUNTER — Ambulatory Visit: Payer: Medicare Other | Admitting: Physician Assistant

## 2018-03-01 ENCOUNTER — Ambulatory Visit: Payer: Medicare Other | Admitting: Physician Assistant

## 2018-03-06 ENCOUNTER — Ambulatory Visit: Payer: Medicare Other | Admitting: Physician Assistant

## 2018-03-08 ENCOUNTER — Ambulatory Visit: Payer: Medicare Other | Admitting: Physician Assistant

## 2018-03-12 ENCOUNTER — Encounter: Payer: Self-pay | Admitting: Physician Assistant

## 2018-03-12 ENCOUNTER — Ambulatory Visit (INDEPENDENT_AMBULATORY_CARE_PROVIDER_SITE_OTHER): Payer: Medicare Other | Admitting: Physician Assistant

## 2018-03-12 VITALS — BP 128/72 | HR 99 | Ht 62.5 in | Wt 180.0 lb

## 2018-03-12 DIAGNOSIS — M7061 Trochanteric bursitis, right hip: Secondary | ICD-10-CM | POA: Diagnosis not present

## 2018-03-12 MED ORDER — TRIAMCINOLONE ACETONIDE 40 MG/ML IJ SUSP
40.0000 mg | INTRAMUSCULAR | Status: AC | PRN
  Administered 2018-03-12: 40 mg via INTRA_ARTICULAR

## 2018-03-12 MED ORDER — DICLOFENAC SODIUM 1 % TD GEL: TRANSDERMAL | 3 refills | Status: DC

## 2018-03-12 MED ORDER — LIDOCAINE HCL 1 % IJ SOLN
1.0000 mL | INTRAMUSCULAR | Status: AC | PRN
  Administered 2018-03-12: 1 mL

## 2018-03-12 NOTE — Progress Notes (Signed)
   Procedure Note  Patient: [name]             Date of Birth: 12-Sep-1941           MRN: [id number]             Visit Date: 03/12/2018  Procedures: Visit Diagnoses: Trochanteric bursitis, right hip  Large Joint Inj: R greater trochanter on 03/12/2018 2:37 PM Indications: pain Details: 27 G 1.5 in needle, lateral approach  Arthrogram: No  Medications: 1 mL lidocaine 1 %; 40 mg triamcinolone acetonide 40 MG/ML Aspirate: 0 mL Outcome: tolerated well, no immediate complications Procedure, treatment alternatives, risks and benefits explained, specific risks discussed. Consent was given by the patient. Immediately prior to procedure a time out was called to verify the correct patient, procedure, equipment, support staff and site/side marked as required. Patient was prepped and draped in the usual sterile fashion.     Patient tolerated the procedure well.   Sherron Ales, PA-C

## 2018-03-12 NOTE — Patient Instructions (Signed)
Trochanteric Bursitis Rehab Ask your health care provider which exercises are safe for you. Do exercises exactly as told by your health care provider and adjust them as directed. It is normal to feel mild stretching, pulling, tightness, or discomfort as you do these exercises, but you should stop right away if you feel sudden pain or your pain gets worse.Do not begin these exercises until told by your health care provider. Stretching exercises These exercises warm up your muscles and joints and improve the movement and flexibility of your hip. These exercises also help to relieve pain and stiffness. Exercise A: Iliotibial band stretch  1. Lie on your side with your left / right leg in the top position. 2. Bend your left / right knee and grab your ankle. 3. Slowly bring your knee back so your thigh is behind your body. 4. Slowly lower your knee toward the floor until you feel a gentle stretch on the outside of your left / right thigh. If you do not feel a stretch and your knee will not fall farther, place the heel of your other foot on top of your outer knee and pull your thigh down farther. 5. Hold this position for __________ seconds. 6. Slowly return to the starting position. Repeat __________ times. Complete this exercise __________ times a day. Strengthening exercises These exercises build strength and endurance in your hip and pelvis. Endurance is the ability to use your muscles for a long time, even after they get tired. Exercise B: Bridge ( hip extensors) 1. Lie on your back on a firm surface with your knees bent and your feet flat on the floor. 2. Tighten your buttocks muscles and lift your buttocks off the floor until your trunk is level with your thighs. You should feel the muscles working in your buttocks and the back of your thighs. If this exercise is too easy, try doing it with your arms crossed over your chest. 3. Hold this position for __________ seconds. 4. Slowly return to the  starting position. 5. Let your muscles relax completely between repetitions. Repeat __________ times. Complete this exercise __________ times a day. Exercise C: Squats ( knee extensors and  quadriceps) 1. Stand in front of a table, with your feet and knees pointing straight ahead. You may rest your hands on the table for balance but not for support. 2. Slowly bend your knees and lower your hips like you are going to sit in a chair. ? Keep your weight over your heels, not over your toes. ? Keep your lower legs upright so they are parallel with the table legs. ? Do not let your hips go lower than your knees. ? Do not bend lower than told by your health care provider. ? If your hip pain increases, do not bend as low. 3. Hold this position for __________ seconds. 4. Slowly push with your legs to return to standing. Do not use your hands to pull yourself to standing. Repeat __________ times. Complete this exercise __________ times a day. Exercise D: Hip hike 1. Stand sideways on a bottom step. Stand on your left / right leg with your other foot unsupported next to the step. You can hold onto the railing or wall if needed for balance. 2. Keeping your knees straight and your torso square, lift your left / right hip up toward the ceiling. 3. Hold this position for __________ seconds. 4. Slowly let your left / right hip lower toward the floor, past the starting position. Your foot   should get closer to the floor. Do not lean or bend your knees. Repeat __________ times. Complete this exercise __________ times a day. Exercise E: Single leg stand 1. Stand near a counter or door frame that you can hold onto for balance as needed. It is helpful to stand in front of a mirror for this exercise so you can watch your hip. 2. Squeeze your left / right buttock muscles then lift up your other foot. Do not let your left / right hip push out to the side. 3. Hold this position for __________ seconds. Repeat  __________ times. Complete this exercise __________ times a day. This information is not intended to replace advice given to you by your health care provider. Make sure you discuss any questions you have with your health care provider. Document Released: 10/27/2004 Document Revised: 05/26/2016 Document Reviewed: 09/04/2015 Elsevier Interactive Patient Education  2018 [place] Band Syndrome Rehab Ask your health care provider which exercises are safe for you. Do exercises exactly as told by your health care provider and adjust them as directed. It is normal to feel mild stretching, pulling, tightness, or discomfort as you do these exercises, but you should stop right away if you feel sudden pain or your pain gets worse.Do not begin these exercises until told by your health care provider. Stretching and range of motion exercises These exercises warm up your muscles and joints and improve the movement and flexibility of your hip and pelvis. Exercise A: Quadriceps, prone  1. Lie on your abdomen on a firm surface, such as a bed or padded floor. 2. Bend your left / right knee and hold your ankle. If you cannot reach your ankle or pant leg, loop a belt around your foot and grab the belt instead. 3. Gently pull your heel toward your buttocks. Your knee should not slide out to the side. You should feel a stretch in the front of your thigh and knee. 4. Hold this position for __________ seconds. Repeat __________ times. Complete this stretch __________ times a day. Exercise B: Iliotibial band  1. Lie on your side with your left / right leg in the top position. 2. Bend both of your knees and grab your left / right ankle. Stretch out your bottom arm to help you balance. 3. Slowly bring your top knee back so your thigh goes behind your trunk. 4. Slowly lower your top leg toward the floor until you feel a gentle stretch on the outside of your left / right hip and thigh. If you do not feel a  stretch and your knee will not fall farther, place the heel of your other foot on top of your knee and pull your knee down toward the floor with your foot. 5. Hold this position for __________ seconds. Repeat __________ times. Complete this stretch __________ times a day. Strengthening exercises These exercises build strength and endurance in your hip and pelvis. Endurance is the ability to use your muscles for a long time, even after they get tired. Exercise C: Straight leg raises ( hip abductors) 1. Lie on your side with your left / right leg in the top position. Lie so your head, shoulder, knee, and hip line up. You may bend your bottom knee to help you balance. 2. Roll your hips slightly forward so your hips are stacked directly over each other and your left / right knee is facing forward. 3. Tense the muscles in your outer thigh and lift your top leg 4-6  inches (10-15 cm). 4. Hold this position for __________ seconds. 5. Slowly return to the starting position. Let your muscles relax completely before doing another repetition. Repeat __________ times. Complete this exercise __________ times a day. Exercise D: Straight leg raises ( hip extensors) 1. Lie on your abdomen on your bed or a firm surface. You can put a pillow under your hips if that is more comfortable. 2. Bend your left / right knee so your foot is straight up in the air. 3. Squeeze your buttock muscles and lift your left / right thigh off the bed. Do not let your back arch. 4. Tense this muscle as hard as you can without increasing any knee pain. 5. Hold this position for __________ seconds. 6. Slowly lower your leg to the starting position and allow it to relax completely. Repeat __________ times. Complete this exercise __________ times a day. Exercise E: Hip hike 1. Stand sideways on a bottom step. Stand on your left / right leg with your other foot unsupported next to the step. You can hold onto the railing or wall if needed  for balance. 2. Keep your knees straight and your torso square. Then, lift your left / right hip up toward the ceiling. 3. Slowly let your left / right hip lower toward the floor, past the starting position. Your foot should get closer to the floor. Do not lean or bend your knees. Repeat __________ times. Complete this exercise __________ times a day. This information is not intended to replace advice given to you by your health care provider. Make sure you discuss any questions you have with your health care provider. Document Released: 09/19/2005 Document Revised: 05/24/2016 Document Reviewed: 08/21/2015 Elsevier Interactive Patient Education  2018 [employer] Iliotibial Band Syndrome Iliotibial band syndrome (ITBS) is a condition that often causes knee pain. It can also cause pain in the outside of your hip, thigh, and knee. The iliotibial band is a strip of tissue that runs from the outside of your hip and down your thigh to the outside of your knee. Repeatedly bending and straightening your knee can irritate the iliotibial band. What are the causes? This condition is caused by inflammation and irritation from the friction of the iliotibial band moving over the thigh bone (femur) when you repeatedly bend and straighten your knee. What increases the risk? This condition is more likely to develop in people who:  Frequently change elevation during their workouts.  Run very long distances.  Recently increased the length or intensity of their workouts.  Run downhill often, or just started running downhill.  Ride a bike very far or often.  You may also be at greater risk if you start a new workout routine without first warming up or if you have a job that requires you to bend, squat, or climb frequently. What are the signs or symptoms? Symptoms of this condition include:  Pain along the outside of your knee that may be worse with activity, especially running or going up and down  stairs.  A "snapping" sensation over your knee.  Swelling on the outside of your knee.  Pain or a feeling of tightness in your hip.  How is this diagnosed? This condition is diagnosed based on your symptoms, medical history, and physical exam. You may also see a health care provider who specializes in reducing pain and increasing mobility (physical therapist). A physical therapist may do an exam to check your balance, movement, and way of walking or running (gait) to  see whether the way you move could contribute to your injury. You may also have tests to measure your strength, flexibility, and range of motion. How is this treated? Treatment for this condition includes:  Resting and limiting exercise.  Returning to activities gradually.  Doing range-of-motion and strengthening exercises (physical therapy) as told by your health care provider.  Including low-impact activities, such as swimming, in your exercise routine.  Follow these instructions at home:  If directed, apply ice to the injured area. ? Put ice in a plastic bag. ? Place a towel between your skin and the bag. ? Leave the ice on for 20 minutes, 2-3 times per day.  Return to your normal activities as told by your health care provider. Ask your health care provider what activities are safe for you.  Keep all follow-up visits with your health care provider. This is important. Contact a health care provider if:  Your pain does not improve or gets worse despite treatment. This information is not intended to replace advice given to you by your health care provider. Make sure you discuss any questions you have with your health care provider. Document Released: 03/11/2002 Document Revised: 10/21/2016 Document Reviewed: 10/21/2016 Elsevier Interactive Patient Education  [employer].

## 2018-05-01 ENCOUNTER — Other Ambulatory Visit: Payer: Self-pay | Admitting: Rheumatology

## 2018-05-01 DIAGNOSIS — Z79899 Other long term (current) drug therapy: Secondary | ICD-10-CM

## 2018-05-02 NOTE — Telephone Encounter (Signed)
Last visit: 03/12/2018 Next visit: 07/10/2018 UDS: 05/09/2017 Narc agreement: 01/29/2017  Advised patient she is due to update UDS and narc agreement. Patient states she will be by this week to update.

## 2018-05-03 ENCOUNTER — Ambulatory Visit: Payer: Medicare Other | Admitting: Physician Assistant

## 2018-05-07 ENCOUNTER — Ambulatory Visit: Payer: Medicare Other | Admitting: Physician Assistant

## 2018-05-10 ENCOUNTER — Ambulatory Visit: Payer: Medicare Other | Admitting: Physician Assistant

## 2018-06-26 NOTE — Progress Notes (Signed)
Office Visit Note  Patient: [name]             Date of Birth: 1940/12/04           MRN: [id number]             PCP: Gwenyth Bender, MD Referring: Gwenyth Bender, MD Visit Date: 07/10/2018 Occupation: @GUAROCC @  Subjective:  Right knee pain and right trochanteric bursitis   History of Present Illness: [name] is a 77 y.o. female with history of osteoarthritis, fibromyalgia, and DDD.  She presents today with right knee pain and right trochanter bursitis.  She reports that her fibromyalgia is flaring currently.  She has generalized muscle aches and muscle tenderness.  She states that she notices intermittent swelling in her right knee joint.  She states that she uses Voltaren gel on a regular basis.  She would like injections in the right knee and right trochanteric bursa.  She gets temporary relief when she has these injections.  She has had Visco gel injections in the past but did not feel they were effective.   Activities of Daily Living:  Patient reports morning stiffness for 5-10 minutes.   Patient Reports nocturnal pain.  Difficulty dressing/grooming: Denies Difficulty climbing stairs: Reports Difficulty getting out of chair: Reports Difficulty using hands for taps, buttons, cutlery, and/or writing: Denies  Review of Systems  Constitutional: Positive for fatigue.  HENT: Positive for mouth dryness. Negative for mouth sores and nose dryness.   Eyes: Positive for dryness. Negative for pain and visual disturbance.  Respiratory: Negative for cough, hemoptysis, shortness of breath and difficulty breathing.   Cardiovascular: Negative for chest pain, palpitations, hypertension and swelling in legs/feet.  Gastrointestinal: Negative for blood in stool, constipation and diarrhea.  Endocrine: Negative for increased urination.  Genitourinary: Negative for painful urination.  Musculoskeletal: Positive for arthralgias, joint pain, joint swelling, myalgias, morning stiffness,  muscle tenderness and myalgias. Negative for muscle weakness.  Skin: Negative for color change, pallor, rash, hair loss, nodules/bumps, skin tightness, ulcers and sensitivity to sunlight.  Allergic/Immunologic: Negative for susceptible to infections.  Neurological: Negative for dizziness, numbness, headaches and weakness.  Hematological: Negative for swollen glands.  Psychiatric/Behavioral: Negative for depressed mood and sleep disturbance. The patient is not nervous/anxious.     PMFS History:  Patient Active Problem List   Diagnosis Date Noted  . History of hyperlipidemia 12/26/2017  . History of atrial fibrillation 12/26/2017  . History of diabetes mellitus 12/26/2017  . History of hypothyroidism 12/26/2017  . History of sleep apnea 12/26/2017  . History of gastroesophageal reflux (GERD) 12/26/2017  . History of asthma 12/26/2017  . On prednisone therapy 05/04/2017  . Chronic left shoulder pain 01/12/2017  . Hx of cardiac cath 12/18/2016  . Chronic pain of right knee 12/07/2016  . Primary osteoarthritis of both hands 12/07/2016  . Chronic right shoulder pain 12/07/2016  . History of osteopenia 12/07/2016  . 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 Atrial fibrillation (HCC) 10/24/2016  . OSA (obstructive sleep apnea) 05/15/2008  . HYPOTHYROIDISM 04/03/2007  . Diabetes mellitus (HCC) 04/03/2007  . HYPERLIPIDEMIA 04/03/2007  . ASTHMA 04/03/2007  . GERD 04/03/2007  . Primary osteoarthritis of right knee 04/03/2007  . DDD (degenerative disc disease), lumbar 04/03/2007  . DDD (degenerative disc disease), cervical 04/03/2007  . Fibromyalgia 04/03/2007  . 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 TACHYCARDIA, HX OF 04/03/2007  . STATUS, KNEE JOINT REPLACEMENT 04/03/2007    Past Medical History:  Diagnosis Date  . Asthma   . Bronchitis   . COPD (chronic obstructive pulmonary disease) (HCC)   . Diabetes mellitus   . Fibromyalgia   . Hyperlipidemia   . Hypertension   .  Osteoporosis   . Rheumatoid aortitis   . Sleep apnea     Family History  Problem Relation Age of Onset  . Diabetes Mother   . Heart failure Mother   . Cancer Mother   . Heart failure Father   . Cancer Sister   . Cancer Sister   . Cancer Brother   . Multiple sclerosis Son    Past Surgical History:  Procedure Laterality Date  . JOINT REPLACEMENT     left total knee  . KNEE ARTHROPLASTY    . TOTAL KNEE ARTHROPLASTY     Social History   Social History Narrative  . Not on file    Objective: Vital Signs: BP 117/80 (BP Location: Right Arm, Patient Position: Sitting, Cuff Size: Small)   Pulse 96   Resp 14    Physical Exam  Constitutional: She is oriented to person, place, and time. She appears well-developed and well-nourished.  HENT:  Head: Normocephalic and atraumatic.  Eyes: Conjunctivae and EOM are normal.  Neck: Normal range of motion.  Cardiovascular: Normal rate, regular rhythm, normal heart sounds and intact distal pulses.  Pulmonary/Chest: Effort normal and breath sounds normal.  Abdominal: Soft. Bowel sounds are normal.  Lymphadenopathy:    She has no cervical adenopathy.  Neurological: She is alert and oriented to person, place, and time.  Skin: Skin is warm and dry. Capillary refill takes less than 2 seconds.  Psychiatric: She has a normal mood and affect. Her behavior is normal.  Nursing note and vitals reviewed.    Musculoskeletal Exam: C-spine limited range of motion.  Thoracic and lumbar spine good range of motion.  No midline spinal tenderness.  No SI joint tenderness.  Shoulder joints, elbow joints, wrist joints, MCPs, PIPs, DIPs good range of motion with no synovitis.  She has PIP and DIP synovial thickening consistent with osteoarthritis of bilateral hands.  Hip joints, knee joints, ankle joints, MTPs, PIPs, DIPs good range of motion with no synovitis.   She has tenderness of the right trochanter bursa on exam.  She has discomfort with right knee range of  motion.  Left knee replacement doing well.  CDAI Exam: CDAI Score: Not documented Patient Global Assessment: Not documented; Provider Global Assessment: Not documented Swollen: Not documented; Tender: Not documented Joint Exam   Not documented   There is currently no information documented on the homunculus. Go to the Rheumatology activity and complete the homunculus joint exam.  Investigation: No additional findings.  Imaging: No results found.  Recent Labs: Lab Results  Component Value Date   WBC 8.8 01/06/2012   HGB 12.9 01/06/2012   PLT 245 01/06/2012   NA 139 01/06/2012   K 3.1 (L) 01/06/2012   CL 101 01/06/2012   CO2 26 01/06/2012   GLUCOSE 117 (H) 01/06/2012   BUN 10 01/06/2012   CREATININE 0.95 01/06/2012   BILITOT 0.4 01/06/2012   ALKPHOS 86 01/06/2012   AST 18 01/06/2012   ALT 12 01/06/2012   PROT 7.5 01/06/2012   ALBUMIN 3.5 01/06/2012   CALCIUM 9.6 01/06/2012   GFRAA 68 (L) 01/06/2012    Speciality Comments: No specialty comments available.  Procedures:  Large Joint Inj: R greater trochanter on 07/10/2018 1:54 PM Indications: pain Details: 27 G 1.5 in needle,  lateral approach  Arthrogram: No  Medications: 40 mg triamcinolone acetonide 40 MG/ML; 1.5 mL lidocaine 1 % Aspirate: 0 mL Outcome: tolerated well, no immediate complications Procedure, treatment alternatives, risks and benefits explained, specific risks discussed. Consent was given by the patient. Immediately prior to procedure a time out was called to verify the correct patient, procedure, equipment, support staff and site/side marked as required. Patient was prepped and draped in the usual sterile fashion.   Large Joint Inj: R knee on 07/10/2018 1:55 PM Indications: pain Details: 27 G 1.5 in needle, medial approach  Arthrogram: No  Medications: 40 mg triamcinolone acetonide 40 MG/ML; 1.5 mL lidocaine 1 % Aspirate: 0 mL Outcome: tolerated well, no immediate complications Procedure,  treatment alternatives, risks and benefits explained, specific risks discussed. Consent was given by the patient. Immediately prior to procedure a time out was called to verify the correct patient, procedure, equipment, support staff and site/side marked as required. Patient was prepped and draped in the usual sterile fashion.     Allergies: Codeine; Darvon [propoxyphene hcl]; Fentanyl; Oxycodone hcl; Propoxyphene n-acetaminophen; Valium [diazepam]; and Valsartan   Assessment / Plan:     Visit Diagnoses: Primary osteoarthritis of both hands - Positive RF, negative CCP no history of synovitis: She has no synovitis on exam.  She is no discomfort in her hands at this time.  She has PIP and DIP synovial thickening consistent with osteoarthritis of bilateral hands.  She has complete fist formation bilaterally.  Joint protection and muscle strengthening were discussed.  Primary osteoarthritis of right knee: Chronic pain.  No warmth or effusion noted.  She requested a right knee cortisone injection today in the office.  She has had Visco gel injections in the past but she did not notice any benefit from.  Trochanteric bursitis, right hip-patient has been having discomfort in difficulty walking.  Per her request we will inject her trochanteric bursa.  History of total knee replacement, left: Doing well.  No warmth or effusion.  She is good range of motion with no discomfort.  Fibromyalgia: She is currently having a fibromyalgia flares.  She has generalized hyperalgesia on exam.  She continues have generalized muscle aches muscle tenderness due to fibromyalgia.  She takes Ultracet 2 tablets by mouth up to 3 times daily for pain relief.  She continues to have chronic fatigue.  She was encouraged to try to stay active and perform trochanteric bursa and knee exercises.  She presents today with right trochanteric bursitis.  She requested a right trochanteric bursa injection and right knee joint injection.   DDD  (degenerative disc disease), cervical: She has had a limited range of motion of her C-spine.  She has chronic pain and stiffness in her neck.  No symptoms of radiculopathy at this time.  DDD (degenerative disc disease), lumbar: She has good ROM with no discomfort.  No midline spinal tenderness.    Other chronic pain - Ultracet. UDS: 8/7/2018Narc agreement: 01/29/2017  Other medical conditions are listed as follows:   Positive TB test  Trochanteric bursitis, right hip  History of sleep apnea  History of diabetes mellitus  History of hypothyroidism  History of atrial fibrillation  History of gastroesophageal reflux (GERD)  History of asthma  History of hyperlipidemia   Orders: No orders of the defined types were placed in this encounter.  No orders of the defined types were placed in this encounter.   Face-to-face time spent with patient was 30 minutes. Greater than 50% of time was spent  in counseling and coordination of care.  Follow-Up Instructions: Return for Osteoarthritis, Fibromyalgia, DDD.   Pollyann Savoy, MD  Note - This record has been created using Animal nutritionist.  Chart creation errors have been sought, but may not always  have been located. Such creation errors do not reflect on  the standard of medical care.

## 2018-07-05 ENCOUNTER — Telehealth: Payer: Self-pay | Admitting: Rheumatology

## 2018-07-05 NOTE — Telephone Encounter (Signed)
Patient called stating she received a reminder call to confirm her appointment for 10/8 with Dr. Corliss Skains.  Patient states she is confused because she received "a call from our office stating she is no longer a patient because of her cancellations."  Patient is requesting a return call.

## 2018-07-06 NOTE — Telephone Encounter (Signed)
Patient advised that our office does not give phone calls to advise patient's that they are no longer patient's. Advised patient that if Dr. Corliss Skains felt that it was best to part ways then she would receive a letter in the mail. Patient advised that is it important that she keep her appointment on 07/10/18 as we do have a no/cancellation policy. Patient advised that she does have a history of multiple cancellations and no shows. Patient advised that Dr. Corliss Skains may not allow [place] to reschedule if she was to cancel the appointment. Verified the appointment multiple times with patient.

## 2018-07-10 ENCOUNTER — Encounter: Payer: Self-pay | Admitting: Rheumatology

## 2018-07-10 ENCOUNTER — Other Ambulatory Visit: Payer: Self-pay | Admitting: *Deleted

## 2018-07-10 ENCOUNTER — Ambulatory Visit (INDEPENDENT_AMBULATORY_CARE_PROVIDER_SITE_OTHER): Payer: Medicare Other | Admitting: Rheumatology

## 2018-07-10 VITALS — BP 117/80 | HR 96 | Resp 14 | Wt 182.0 lb

## 2018-07-10 DIAGNOSIS — Z8679 Personal history of other diseases of the circulatory system: Secondary | ICD-10-CM

## 2018-07-10 DIAGNOSIS — Z8719 Personal history of other diseases of the digestive system: Secondary | ICD-10-CM

## 2018-07-10 DIAGNOSIS — M797 Fibromyalgia: Secondary | ICD-10-CM | POA: Diagnosis not present

## 2018-07-10 DIAGNOSIS — Z96652 Presence of left artificial knee joint: Secondary | ICD-10-CM

## 2018-07-10 DIAGNOSIS — M7061 Trochanteric bursitis, right hip: Secondary | ICD-10-CM

## 2018-07-10 DIAGNOSIS — M19041 Primary osteoarthritis, right hand: Secondary | ICD-10-CM | POA: Diagnosis not present

## 2018-07-10 DIAGNOSIS — Z79899 Other long term (current) drug therapy: Secondary | ICD-10-CM

## 2018-07-10 DIAGNOSIS — Z8709 Personal history of other diseases of the respiratory system: Secondary | ICD-10-CM

## 2018-07-10 DIAGNOSIS — Z8639 Personal history of other endocrine, nutritional and metabolic disease: Secondary | ICD-10-CM

## 2018-07-10 DIAGNOSIS — M5030 Other cervical disc degeneration, unspecified cervical region: Secondary | ICD-10-CM

## 2018-07-10 DIAGNOSIS — M5136 Other intervertebral disc degeneration, lumbar region: Secondary | ICD-10-CM

## 2018-07-10 DIAGNOSIS — M19042 Primary osteoarthritis, left hand: Secondary | ICD-10-CM

## 2018-07-10 DIAGNOSIS — M51369 Other intervertebral disc degeneration, lumbar region without mention of lumbar back pain or lower extremity pain: Secondary | ICD-10-CM

## 2018-07-10 DIAGNOSIS — M1711 Unilateral primary osteoarthritis, right knee: Secondary | ICD-10-CM | POA: Diagnosis not present

## 2018-07-10 DIAGNOSIS — G8929 Other chronic pain: Secondary | ICD-10-CM

## 2018-07-10 DIAGNOSIS — Z8669 Personal history of other diseases of the nervous system and sense organs: Secondary | ICD-10-CM

## 2018-07-10 DIAGNOSIS — R7611 Nonspecific reaction to tuberculin skin test without active tuberculosis: Secondary | ICD-10-CM

## 2018-07-10 MED ORDER — LIDOCAINE HCL 1 % IJ SOLN
1.5000 mL | INTRAMUSCULAR | Status: AC | PRN
  Administered 2018-07-10: 1.5 mL

## 2018-07-10 MED ORDER — TRIAMCINOLONE ACETONIDE 40 MG/ML IJ SUSP
40.0000 mg | INTRAMUSCULAR | Status: AC | PRN
  Administered 2018-07-10: 40 mg via INTRA_ARTICULAR

## 2018-07-10 NOTE — Addendum Note (Signed)
Addended by: Henriette Combs on: 07/10/2018 02:05 PM   Modules accepted: Orders

## 2018-07-12 LAB — PAIN MGMT, PROFILE 5 W/CONF, U
AMPHETAMINES: NEGATIVE ng/mL (ref <500)
BARBITURATES: NEGATIVE ng/mL (ref <300)
BENZODIAZEPINES: NEGATIVE ng/mL (ref <100)
COCAINE METABOLITE: NEGATIVE ng/mL (ref <150)
Creatinine: 198.5 mg/dL
Marijuana Metabolite: NEGATIVE ng/mL (ref <20)
Methadone Metabolite: NEGATIVE ng/mL (ref <100)
OPIATES: NEGATIVE ng/mL (ref <100)
Oxidant: NEGATIVE ug/mL (ref <200)
Oxycodone: NEGATIVE ng/mL (ref <100)
pH: 6.25 (ref 4.5–9.0)

## 2018-07-12 LAB — PAIN MGMT, TRAMADOL W/MEDMATCH, U
Desmethyltramadol: NEGATIVE ng/mL (ref <100)
Tramadol: NEGATIVE ng/mL (ref <100)

## 2018-07-18 ENCOUNTER — Telehealth: Payer: Self-pay | Admitting: Rheumatology

## 2018-07-18 NOTE — Telephone Encounter (Signed)
Patient called requesting prescription refill of Tramadol to be sent to [facility] on [address] in [place].

## 2018-07-19 MED ORDER — TRAMADOL-ACETAMINOPHEN 37.5-325 MG PO TABS: ORAL_TABLET | ORAL | 0 refills | Status: DC

## 2018-07-19 NOTE — Telephone Encounter (Signed)
Last Visit: 07/10/18 Next Visit: 11/13/18 UDS: 07/10/18 Narc Agreement: 07/10/18   Okay to refill Tramadol?

## 2018-07-23 ENCOUNTER — Telehealth: Payer: Self-pay | Admitting: *Deleted

## 2018-07-23 NOTE — Telephone Encounter (Signed)
Prior Authorization submitted via cover my meds on ultracet. Will update once response is received.

## 2018-07-25 NOTE — Telephone Encounter (Signed)
Prior authorization has been approved until 10/03/2019

## 2018-08-02 ENCOUNTER — Emergency Department (HOSPITAL_COMMUNITY)
Admission: EM | Admit: 2018-08-02 | Discharge: 2018-08-02 | Disposition: A | Discharge status: Home / Self Care | Payer: Medicare Other | Attending: Emergency Medicine | Admitting: Emergency Medicine

## 2018-08-02 ENCOUNTER — Encounter (HOSPITAL_COMMUNITY): Payer: Self-pay

## 2018-08-02 ENCOUNTER — Emergency Department (HOSPITAL_COMMUNITY): Payer: Medicare Other

## 2018-08-02 ENCOUNTER — Other Ambulatory Visit: Payer: Self-pay

## 2018-08-02 DIAGNOSIS — Z7984 Long term (current) use of oral hypoglycemic drugs: Secondary | ICD-10-CM | POA: Insufficient documentation

## 2018-08-02 DIAGNOSIS — R059 Cough, unspecified: Secondary | ICD-10-CM

## 2018-08-02 DIAGNOSIS — E119 Type 2 diabetes mellitus without complications: Secondary | ICD-10-CM | POA: Insufficient documentation

## 2018-08-02 DIAGNOSIS — R062 Wheezing: Secondary | ICD-10-CM

## 2018-08-02 DIAGNOSIS — Z7982 Long term (current) use of aspirin: Secondary | ICD-10-CM | POA: Diagnosis not present

## 2018-08-02 DIAGNOSIS — R05 Cough: Secondary | ICD-10-CM | POA: Diagnosis present

## 2018-08-02 DIAGNOSIS — Z87891 Personal history of nicotine dependence: Secondary | ICD-10-CM | POA: Diagnosis not present

## 2018-08-02 DIAGNOSIS — Z79899 Other long term (current) drug therapy: Secondary | ICD-10-CM | POA: Diagnosis not present

## 2018-08-02 DIAGNOSIS — I10 Essential (primary) hypertension: Secondary | ICD-10-CM | POA: Diagnosis not present

## 2018-08-02 DIAGNOSIS — J45901 Unspecified asthma with (acute) exacerbation: Secondary | ICD-10-CM | POA: Diagnosis not present

## 2018-08-02 MED ORDER — IPRATROPIUM-ALBUTEROL 0.5-2.5 (3) MG/3ML IN SOLN
3.0000 mL | Freq: Once | RESPIRATORY_TRACT | Status: AC
  Administered 2018-08-02: 3 mL via RESPIRATORY_TRACT
  Filled 2018-08-02: qty 3

## 2018-08-02 MED ORDER — PREDNISONE 50 MG PO TABS: 50.0000 mg | ORAL_TABLET | Freq: Every day | ORAL | 0 refills | Status: AC

## 2018-08-02 MED ORDER — LEVALBUTEROL HCL 0.63 MG/3ML IN NEBU
0.6300 mg | INHALATION_SOLUTION | Freq: Once | RESPIRATORY_TRACT | Status: AC
  Administered 2018-08-02: 0.63 mg via RESPIRATORY_TRACT
  Filled 2018-08-02: qty 3

## 2018-08-02 MED ORDER — ALBUTEROL SULFATE HFA 108 (90 BASE) MCG/ACT IN AERS
2.0000 | INHALATION_SPRAY | Freq: Once | RESPIRATORY_TRACT | Status: AC
  Administered 2018-08-02: 2 via RESPIRATORY_TRACT
  Filled 2018-08-02: qty 6.7

## 2018-08-02 MED ORDER — PREDNISONE 20 MG PO TABS
60.0000 mg | ORAL_TABLET | Freq: Once | ORAL | Status: AC
  Administered 2018-08-02: 60 mg via ORAL
  Filled 2018-08-02: qty 3

## 2018-08-02 NOTE — ED Provider Notes (Signed)
Patient placed in Quick Look pathway, seen and evaluated   Chief Complaint: cough  HPI: [name] is a 77 y.o. female who presents to the ED with cough. The cough started 5 days ago. Patient c/o shortness of breath. Cough is productive with yellow sputum.  ROS: Resp: cough, shortness of breath  Physical Exam:  BP (!) 135/102 (BP Location: Left Arm)   Pulse (!) 120   Temp 98.8 F (37.1 C) (Oral)   Resp 18   Ht 5\' 2"  (1.575 m)   Wt 81.6 kg   SpO2 100%   BMI 32.92 kg/m    Gen: No distress  Neuro: Awake and Alert  Skin: Warm and dry  Lungs: decreased breath sounds, wheezing bilateral   Initiation of care has begun. The patient has been counseled on the process, plan, and necessity for staying for the completion/evaluation, and the remainder of the medical screening examination    , NP 08/02/18 1512    08/04/18, MD 08/03/18 [phone]

## 2018-08-02 NOTE — ED Triage Notes (Signed)
Pt presents with 5 day h/o cough, chest tightness and shortness of breath.  Pt reports coughing but unable to clear sputum.

## 2018-08-02 NOTE — Discharge Instructions (Signed)
Your history and exam today were consistent with an asthma exacerbation.  You improved your breathing after breathing treatments and the steroids had time to get in your system.  Your chest x-ray did not show evidence of pneumonia or bronchitis.  Please follow-up with your primary doctor in several days and use the medications.  If any symptoms change or worsen, please return to the nearest emergency department.

## 2018-08-02 NOTE — ED Notes (Signed)
Patient transported to X-ray 

## 2018-08-02 NOTE — ED Notes (Signed)
Pt states she is ready to go home and that she feels better already.

## 2018-08-02 NOTE — ED Notes (Signed)
Patient verbalizes understanding of discharge instructions. Opportunity for questioning and answers were provided. Armband removed by staff, pt discharged from ED in wheelchair, with her son.

## 2018-08-02 NOTE — ED Provider Notes (Signed)
[facility] EMERGENCY DEPARTMENT Provider Note   CSN: [id number] Arrival date & time: 08/02/18  1434     History   Chief Complaint Chief Complaint  Patient presents with  . Cough    HPI [name] is a 77 y.o. female.  The history is provided by the patient, a relative and medical records. No language interpreter was used.  Wheezing   This is a recurrent problem. The current episode started more than 2 days ago. The problem occurs constantly. The problem has been gradually worsening. Associated symptoms include cough and sputum production. Pertinent negatives include no chest pain, no fever, no abdominal pain, no vomiting, no diarrhea, no dysuria, no rhinorrhea, no neck pain and no rash. It is unknown what precipitated the problem. She has tried nothing for the symptoms. The treatment provided no relief. Her past medical history is significant for asthma. Her past medical history does not include heart failure, CAD, past MI, pneumonia, PE or DVT.    Past Medical History:  Diagnosis Date  . Asthma   . Bronchitis   . COPD (chronic obstructive pulmonary disease) (HCC)   . Diabetes mellitus   . Fibromyalgia   . Hyperlipidemia   . Hypertension   . Osteoporosis   . Rheumatoid aortitis   . Sleep apnea     Patient Active Problem List   Diagnosis Date Noted  . History of hyperlipidemia 12/26/2017  . History of atrial fibrillation 12/26/2017  . History of diabetes mellitus 12/26/2017  . History of hypothyroidism 12/26/2017  . History of sleep apnea 12/26/2017  . History of gastroesophageal reflux (GERD) 12/26/2017  . History of asthma 12/26/2017  . On prednisone therapy 05/04/2017  . Chronic left shoulder pain 01/12/2017  . Hx of cardiac cath 12/18/2016  . Chronic pain of right knee 12/07/2016  . Primary osteoarthritis of both hands 12/07/2016  . Chronic right shoulder pain 12/07/2016  . History of osteopenia 12/07/2016  . History of total knee  replacement, left 12/07/2016  . Atrial fibrillation (HCC) 10/24/2016  . OSA (obstructive sleep apnea) 05/15/2008  . HYPOTHYROIDISM 04/03/2007  . Diabetes mellitus (HCC) 04/03/2007  . HYPERLIPIDEMIA 04/03/2007  . ASTHMA 04/03/2007  . GERD 04/03/2007  . Primary osteoarthritis of right knee 04/03/2007  . DDD (degenerative disc disease), lumbar 04/03/2007  . DDD (degenerative disc disease), cervical 04/03/2007  . Fibromyalgia 04/03/2007  . 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 TACHYCARDIA, HX OF 04/03/2007  . STATUS, KNEE JOINT REPLACEMENT 04/03/2007    Past Surgical History:  Procedure Laterality Date  . JOINT REPLACEMENT     left total knee  . KNEE ARTHROPLASTY    . TOTAL KNEE ARTHROPLASTY       OB History   None      Home Medications    Prior to Admission medications   Medication Sig Start Date End Date Taking? Authorizing Provider  albuterol (PROVENTIL HFA;VENTOLIN HFA) 108 (90 Base) MCG/ACT inhaler Inhale 1-2 puffs into the lungs every 6 (six) hours as needed for wheezing or shortness of breath.    [provider]  aspirin 81 MG chewable tablet Chew 81 mg by mouth. Pt takes med 3 x a week    [provider]  cycloSPORINE (RESTASIS) 0.05 % ophthalmic emulsion 1 drop 2 (two) times daily.    [provider]  diclofenac sodium (VOLTAREN) 1 % GEL Apply 3 grams to 3 large joints up to three times daily. 03/12/18   Gearldine Bienenstock, PA-C  guaiFENesin (  MUCINEX) 600 MG 12 hr tablet Take 600 mg by mouth 2 (two) times daily as needed for congestion.    [provider]  Lancets Letta Pate ULTRASOFT) lancets  09/17/16   [provider]  levothyroxine (SYNTHROID, LEVOTHROID) 50 MCG tablet Take 50 mcg by mouth daily before breakfast. 11/23/16   [provider]  meclizine (ANTIVERT) 12.5 MG tablet Take 12.5 mg by mouth 3 (three) times daily as needed for dizziness. 06/12/16   [provider]  metFORMIN (GLUCOPHAGE-XR) 500 MG 24 hr tablet  Take 250 mg by mouth daily. 12/07/16   [provider]  metoprolol succinate (TOPROL-XL) 25 MG 24 hr tablet Take 25 mg by mouth daily. 12/07/16   [provider]  montelukast (SINGULAIR) 10 MG tablet Take 10 mg by mouth daily. 07/28/16   [provider]  Multiple Vitamin (MULITIVITAMIN WITH MINERALS) TABS Take 1 tablet by mouth daily.    [provider]  omeprazole (PRILOSEC) 20 MG capsule Take 20 mg by mouth daily.    [provider]  omeprazole (PRILOSEC) 40 MG capsule Take 40 mg by mouth daily. 12/01/17   [provider]  ONE TOUCH ULTRA TEST test strip  09/17/16   [provider]  oxybutynin (DITROPAN-XL) 10 MG 24 hr tablet  12/28/16   [provider]  polyvinyl alcohol (LIQUIFILM TEARS) 1.4 % ophthalmic solution Place 2 drops into both eyes as needed.    [provider]  RA VITAMIN D-3 2000 units CAPS Take 1 capsule by mouth daily. 09/15/16   [provider]  simvastatin (ZOCOR) 20 MG tablet Take 20 mg by mouth every evening.    [provider]  TAZTIA XT 120 MG 24 hr capsule Take 120 mg by mouth daily. 12/07/16   [provider]  tiotropium (SPIRIVA) 18 MCG inhalation capsule Place 18 mcg into inhaler and inhale daily.    [provider]  traMADol-acetaminophen Alphonzo Severance) 37.5-325 MG tablet Take 2 tablets by mouth three times a day if needed 07/19/18   Gearldine Bienenstock, PA-C    Family History Family History  Problem Relation Age of Onset  . Diabetes Mother   . Heart failure Mother   . Cancer Mother   . Heart failure Father   . Cancer Sister   . Cancer Sister   . Cancer Brother   . Multiple sclerosis Son     Social History Social History   Tobacco Use  . Smoking status: Former Smoker    Packs/day: 1.00    Years: 30.00    Pack years: 30.00    Types: Cigarettes    Last attempt to quit: 01/11/1996    Years since quitting: 22.5  . Smokeless tobacco: Never Used  Substance  Use Topics  . Alcohol use: Yes    Comment: OCC WINE  . Drug use: No     Allergies   Codeine; Darvon [propoxyphene hcl]; Fentanyl; Oxycodone hcl; Propoxyphene n-acetaminophen; Valium [diazepam]; and Valsartan   Review of Systems Review of Systems  Constitutional: Negative for chills, diaphoresis, fatigue and fever.  HENT: Negative for congestion and rhinorrhea.   Eyes: Negative for photophobia and visual disturbance.  Respiratory: Positive for cough, sputum production and wheezing. Negative for choking, chest tightness, shortness of breath and stridor.   Cardiovascular: Negative for chest pain, palpitations and leg swelling.  Gastrointestinal: Negative for abdominal pain, constipation, diarrhea, nausea and vomiting.  Genitourinary: Negative for dysuria, flank pain and frequency.  Musculoskeletal: Negative for back pain, neck pain  and neck stiffness.  Skin: Negative for rash and wound.  Neurological: Negative for seizures, light-headedness and numbness.  Psychiatric/Behavioral: Negative for agitation.  All other systems reviewed and are negative.    Physical Exam Updated Vital Signs BP (!) 135/102 (BP Location: Left Arm)   Pulse (!) 120   Temp 98.8 F (37.1 C) (Oral)   Resp 18   Ht 5\' 2"  (1.575 m)   Wt 81.6 kg   SpO2 100%   BMI 32.92 kg/m   Physical Exam  Constitutional: She is oriented to person, place, and time. She appears well-developed and well-nourished. No distress.  HENT:  Head: Normocephalic and atraumatic.  Mouth/Throat: Oropharynx is clear and moist. No oropharyngeal exudate.  Eyes: Pupils are equal, round, and reactive to light. Conjunctivae and EOM are normal.  Neck: Normal range of motion. Neck supple.  Cardiovascular: Regular rhythm. Tachycardia present.  No murmur heard. Pulmonary/Chest: Effort normal. No stridor. No respiratory distress. She has wheezes. She has no rhonchi. She has no rales. She exhibits no tenderness.  Abdominal: Soft. There is no  tenderness.  Musculoskeletal: She exhibits no edema or tenderness.  Neurological: She is alert and oriented to person, place, and time. No sensory deficit. She exhibits normal muscle tone.  Skin: Skin is warm and dry. Capillary refill takes less than 2 seconds. No rash noted. She is not diaphoretic. No erythema.  Psychiatric: She has a normal mood and affect.  Nursing note and vitals reviewed.    ED Treatments / Results  Labs (all labs ordered are listed, but only abnormal results are displayed) Labs Reviewed - No data to display  EKG EKG Interpretation  Date/Time:  Thursday August 02 2018 14:49:58 EDT Ventricular Rate:  120 PR Interval:  166 QRS Duration: 70 QT Interval:  298 QTC Calculation: 421 R Axis:   17 Text Interpretation:  Sinus tachycardia Cannot rule out Anterior infarct , age undetermined Abnormal ECG When compared to prior, no signfiaint changes, similar tachycardia.  No STEMI Confirmed by 12-30-2000 (Theda Belfast) on 08/02/2018 3:56:36 PM   Radiology Dg Chest 2 View  Result Date: 08/02/2018 CLINICAL DATA:  Cough, chest tightness EXAM: CHEST - 2 VIEW COMPARISON:  07/10/2014 FINDINGS: Heart and mediastinal contours are within normal limits. No focal opacities or effusions. No acute bony abnormality. Thoracolumbar scoliosis. IMPRESSION: No active cardiopulmonary disease. Electronically Signed   By: 09/09/2014 M.D.   On: 08/02/2018 16:13    Procedures Procedures (including critical care time)  Medications Ordered in ED Medications  albuterol (PROVENTIL HFA;VENTOLIN HFA) 108 (90 Base) MCG/ACT inhaler 2 puff (has no administration in time range)  levalbuterol (XOPENEX) nebulizer solution 0.63 mg (0.63 mg Nebulization Given 08/02/18 1505)  ipratropium-albuterol (DUONEB) 0.5-2.5 (3) MG/3ML nebulizer solution 3 mL (3 mLs Nebulization Given 08/02/18 1700)  predniSONE (DELTASONE) tablet 60 mg (60 mg Oral Given 08/02/18 1700)     Initial Impression / Assessment and  Plan / ED Course  I have reviewed the triage vital signs and the nursing notes.  Pertinent labs & imaging results that were available during my care of the patient were reviewed by me and considered in my medical decision making (see chart for details).     [name] is a 77 y.o. female with a past medical history significant for asthma who presents with several days of shortness of breath wheezing and cough.  Patient reports that she has not had a nebulizer or inhaler for quite some time.  She says that she has done  well with her reactive airway disease for many months.  She says over the last several days she thought she may be coming down with bronchitis or an asthma attack.  She denies any chest pain, fevers, chills, nausea, vomiting, conservation, diarrhea, or urinary symptoms.  She reports her legs are not swollen and she has no history of DVT or PE.  She thinks that she is having an asthma attack in the setting of the temperature changes in environment.    On arrival, patient is slightly tachycardic.  But breathing on room air with normal oxygen saturations.  Patient is having no chest pain and minimal shortness of breath.  Lungs were wheezing in all lung fields however she had no rhonchi or rales.  Breath sounds are equal bilaterally.  Chest was nontender.  Abdomen is nontender.  No edema seen.  Patient comfortable on exam.  EKG revealed similar tachycardia to prior, no STEMI.    Shared decision in conversation held with patient.  We discussed blood work due to her fast heart rate however she would rather get treatment for the present of asthma attack and look for pneumonia with chest x-ray.  Given her lack of any chest pain, have lower suspicion for PE or other cardiac etiology of her symptoms.  As she reports this feels like an asthma attack and her wheezing has improved and her breathing is improved, I suspect this is the cause.  X-ray shows no pneumonia or bronchitis.  No other  abnormality seen.  Patient will be given a second breathing treatment given her wheezing and will be given oral steroids.  Patient will be reassessed with pulse ox with ambulation.  Patient is feeling better and appears well, shared decision-making conversation led to her being discharged home.  Next  Anticipate discharge if she is breathing better.  8:05 PM Patient is breathing felt much better with no more wheezing on reassessment after breathing treatment.  Patient feels comfortable going home now that breathing is improved.  We did not see evidence of infection pneumonia or bronchitis on imaging.    Patient will follow-up with her PCP in several days and was given an inhaler to go home with.  Patient understands return precautions and plan of care.  Patient discharged in good condition with resolution of her shortness of breath and wheezing.   Final Clinical Impressions(s) / ED Diagnoses   Final diagnoses:  Cough  Exacerbation of asthma, unspecified asthma severity, unspecified whether persistent  Wheezing    ED Discharge Orders         Ordered    predniSONE (DELTASONE) 50 MG tablet  Daily     08/02/18 2004          Clinical Impression: 1. Cough   2. Exacerbation of asthma, unspecified asthma severity, unspecified whether persistent   3. Wheezing     Disposition: Discharge  Condition: Good  I have discussed the results, Dx and Tx plan with the pt(& family if present). He/she/they expressed understanding and agree(s) with the plan. Discharge instructions discussed at great length. Strict return precautions discussed and pt &/or family have verbalized understanding of the instructions. No further questions at time of discharge.    New Prescriptions   PREDNISONE (DELTASONE) 50 MG TABLET    Take 1 tablet (50 mg total) by mouth daily for 5 days.    Follow Up: Gwenyth Bender, MD [address] [place] [address] [phone]     [facility]  EMERGENCY DEPARTMENT [address] [id number] mc [place] [address] [phone]       Allizon Woznick, Canary Brim, MD 08/02/18 2006

## 2018-08-08 ENCOUNTER — Inpatient Hospital Stay (HOSPITAL_COMMUNITY)
Admission: EM | Admit: 2018-08-08 | Discharge: 2018-08-16 | DRG: 871 | Disposition: A | Discharge status: Home Health | Payer: Medicare Other | Attending: Internal Medicine | Admitting: Internal Medicine

## 2018-08-08 ENCOUNTER — Emergency Department (HOSPITAL_COMMUNITY): Payer: Medicare Other

## 2018-08-08 ENCOUNTER — Other Ambulatory Visit: Payer: Self-pay

## 2018-08-08 ENCOUNTER — Encounter (HOSPITAL_COMMUNITY): Payer: Self-pay | Admitting: *Deleted

## 2018-08-08 DIAGNOSIS — A419 Sepsis, unspecified organism: Principal | ICD-10-CM

## 2018-08-08 DIAGNOSIS — G4733 Obstructive sleep apnea (adult) (pediatric): Secondary | ICD-10-CM | POA: Diagnosis present

## 2018-08-08 DIAGNOSIS — J45909 Unspecified asthma, uncomplicated: Secondary | ICD-10-CM | POA: Diagnosis present

## 2018-08-08 DIAGNOSIS — Z79890 Hormone replacement therapy (postmenopausal): Secondary | ICD-10-CM

## 2018-08-08 DIAGNOSIS — E11649 Type 2 diabetes mellitus with hypoglycemia without coma: Secondary | ICD-10-CM | POA: Diagnosis not present

## 2018-08-08 DIAGNOSIS — E876 Hypokalemia: Secondary | ICD-10-CM | POA: Diagnosis present

## 2018-08-08 DIAGNOSIS — E785 Hyperlipidemia, unspecified: Secondary | ICD-10-CM

## 2018-08-08 DIAGNOSIS — E860 Dehydration: Secondary | ICD-10-CM | POA: Diagnosis not present

## 2018-08-08 DIAGNOSIS — R000 Tachycardia, unspecified: Secondary | ICD-10-CM | POA: Diagnosis present

## 2018-08-08 DIAGNOSIS — Z7984 Long term (current) use of oral hypoglycemic drugs: Secondary | ICD-10-CM

## 2018-08-08 DIAGNOSIS — Z888 Allergy status to other drugs, medicaments and biological substances status: Secondary | ICD-10-CM

## 2018-08-08 DIAGNOSIS — E1301 Other specified diabetes mellitus with hyperosmolarity with coma: Secondary | ICD-10-CM

## 2018-08-08 DIAGNOSIS — N390 Urinary tract infection, site not specified: Secondary | ICD-10-CM | POA: Diagnosis present

## 2018-08-08 DIAGNOSIS — Z885 Allergy status to narcotic agent status: Secondary | ICD-10-CM

## 2018-08-08 DIAGNOSIS — F0390 Unspecified dementia without behavioral disturbance: Secondary | ICD-10-CM

## 2018-08-08 DIAGNOSIS — M810 Age-related osteoporosis without current pathological fracture: Secondary | ICD-10-CM | POA: Diagnosis present

## 2018-08-08 DIAGNOSIS — Z79899 Other long term (current) drug therapy: Secondary | ICD-10-CM

## 2018-08-08 DIAGNOSIS — E039 Hypothyroidism, unspecified: Secondary | ICD-10-CM

## 2018-08-08 DIAGNOSIS — R5381 Other malaise: Secondary | ICD-10-CM

## 2018-08-08 DIAGNOSIS — E1165 Type 2 diabetes mellitus with hyperglycemia: Secondary | ICD-10-CM

## 2018-08-08 DIAGNOSIS — M797 Fibromyalgia: Secondary | ICD-10-CM | POA: Diagnosis present

## 2018-08-08 DIAGNOSIS — D649 Anemia, unspecified: Secondary | ICD-10-CM | POA: Diagnosis not present

## 2018-08-08 DIAGNOSIS — E878 Other disorders of electrolyte and fluid balance, not elsewhere classified: Secondary | ICD-10-CM | POA: Diagnosis present

## 2018-08-08 DIAGNOSIS — I10 Essential (primary) hypertension: Secondary | ICD-10-CM

## 2018-08-08 DIAGNOSIS — D72829 Elevated white blood cell count, unspecified: Secondary | ICD-10-CM

## 2018-08-08 DIAGNOSIS — E871 Hypo-osmolality and hyponatremia: Secondary | ICD-10-CM | POA: Diagnosis not present

## 2018-08-08 DIAGNOSIS — N3281 Overactive bladder: Secondary | ICD-10-CM

## 2018-08-08 DIAGNOSIS — Z7982 Long term (current) use of aspirin: Secondary | ICD-10-CM

## 2018-08-08 DIAGNOSIS — G9341 Metabolic encephalopathy: Secondary | ICD-10-CM

## 2018-08-08 DIAGNOSIS — K219 Gastro-esophageal reflux disease without esophagitis: Secondary | ICD-10-CM | POA: Diagnosis present

## 2018-08-08 DIAGNOSIS — E1110 Type 2 diabetes mellitus with ketoacidosis without coma: Secondary | ICD-10-CM | POA: Diagnosis present

## 2018-08-08 DIAGNOSIS — E870 Hyperosmolality and hypernatremia: Secondary | ICD-10-CM | POA: Diagnosis present

## 2018-08-08 DIAGNOSIS — M069 Rheumatoid arthritis, unspecified: Secondary | ICD-10-CM | POA: Diagnosis present

## 2018-08-08 DIAGNOSIS — J449 Chronic obstructive pulmonary disease, unspecified: Secondary | ICD-10-CM

## 2018-08-08 DIAGNOSIS — Z96652 Presence of left artificial knee joint: Secondary | ICD-10-CM | POA: Diagnosis present

## 2018-08-08 DIAGNOSIS — Z87891 Personal history of nicotine dependence: Secondary | ICD-10-CM

## 2018-08-08 DIAGNOSIS — N179 Acute kidney failure, unspecified: Secondary | ICD-10-CM

## 2018-08-08 LAB — I-STAT CHEM 8, ED
BUN: 56 mg/dL — ABNORMAL HIGH (ref 8–23)
CALCIUM ION: 1.08 mmol/L — AB (ref 1.15–1.40)
CHLORIDE: 108 mmol/L (ref 98–111)
Creatinine, Ser: 1.6 mg/dL — ABNORMAL HIGH (ref 0.44–1.00)
Glucose, Bld: >700 mg/dL (ref 70–99)
HEMATOCRIT: 45 % (ref 36.0–46.0)
Hemoglobin: 15.3 g/dL — ABNORMAL HIGH (ref 12.0–15.0)
Potassium: 5.2 mmol/L — ABNORMAL HIGH (ref 3.5–5.1)
Sodium: 139 mmol/L (ref 135–145)
TCO2: 18 mmol/L — ABNORMAL LOW (ref 22–32)

## 2018-08-08 LAB — I-STAT CG4 LACTIC ACID, ED: LACTIC ACID, VENOUS: 3.59 mmol/L — AB (ref 0.5–1.9)

## 2018-08-08 LAB — BASIC METABOLIC PANEL
Anion gap: 19 — ABNORMAL HIGH (ref 5–15)
Anion gap: 12 (ref 5–15)
BUN: 42 mg/dL — ABNORMAL HIGH (ref 8–23)
BUN: 39 mg/dL — ABNORMAL HIGH (ref 8–23)
CALCIUM: 9 mg/dL (ref 8.9–10.3)
CO2: 17 mmol/L — ABNORMAL LOW (ref 22–32)
CO2: 19 mmol/L — ABNORMAL LOW (ref 22–32)
CREATININE: 1.62 mg/dL — AB (ref 0.44–1.00)
CREATININE: 1.5 mg/dL — AB (ref 0.44–1.00)
Calcium: 9.1 mg/dL (ref 8.9–10.3)
Chloride: 112 mmol/L — ABNORMAL HIGH (ref 98–111)
Chloride: 117 mmol/L — ABNORMAL HIGH (ref 98–111)
GFR, EST AFRICAN AMERICAN: 34 mL/min — AB (ref >60)
GFR, EST AFRICAN AMERICAN: 37 mL/min — AB (ref >60)
GFR, EST NON AFRICAN AMERICAN: 29 mL/min — AB (ref >60)
GFR, EST NON AFRICAN AMERICAN: 32 mL/min — AB (ref >60)
Glucose, Bld: 458 mg/dL — ABNORMAL HIGH (ref 70–99)
Glucose, Bld: 289 mg/dL — ABNORMAL HIGH (ref 70–99)
Potassium: 4.2 mmol/L (ref 3.5–5.1)
Potassium: 3.7 mmol/L (ref 3.5–5.1)
SODIUM: 148 mmol/L — AB (ref 135–145)
SODIUM: 148 mmol/L — AB (ref 135–145)

## 2018-08-08 LAB — CBC WITH DIFFERENTIAL/PLATELET
ABS IMMATURE GRANULOCYTES: 0.15 10*3/uL — AB (ref 0.00–0.07)
BASOS ABS: 0 10*3/uL (ref 0.0–0.1)
Basophils Relative: 0 %
Eosinophils Absolute: 0 10*3/uL (ref 0.0–0.5)
Eosinophils Relative: 0 %
HCT: 53.3 % — ABNORMAL HIGH (ref 36.0–46.0)
HEMOGLOBIN: 16.4 g/dL — AB (ref 12.0–15.0)
Immature Granulocytes: 1 %
LYMPHS PCT: 13 %
Lymphs Abs: 2.5 10*3/uL (ref 0.7–4.0)
MCH: 27.5 pg (ref 26.0–34.0)
MCHC: 30.8 g/dL (ref 30.0–36.0)
MCV: 89.4 fL (ref 80.0–100.0)
Monocytes Absolute: 1.3 10*3/uL — ABNORMAL HIGH (ref 0.1–1.0)
Monocytes Relative: 6 %
Neutro Abs: 15.8 10*3/uL — ABNORMAL HIGH (ref 1.7–7.7)
Neutrophils Relative %: 80 %
Platelets: 300 10*3/uL (ref 150–400)
RBC: 5.96 MIL/uL — AB (ref 3.87–5.11)
RDW: 14.6 % (ref 11.5–15.5)
WBC: 19.8 10*3/uL — AB (ref 4.0–10.5)
nRBC: 0 % (ref 0.0–0.2)

## 2018-08-08 LAB — CBG MONITORING, ED: Glucose-Capillary: 497 mg/dL — ABNORMAL HIGH (ref 70–99)

## 2018-08-08 LAB — BLOOD GAS, VENOUS
Acid-base deficit: 7.7 mmol/L — ABNORMAL HIGH (ref 0.0–2.0)
BICARBONATE: 17.1 mmol/L — AB (ref 20.0–28.0)
O2 Saturation: 71.5 %
PO2 VEN: 44.3 mmHg (ref 32.0–45.0)
Patient temperature: 98.6
pCO2, Ven: 34.5 mmHg — ABNORMAL LOW (ref 44.0–60.0)
pH, Ven: 7.316 (ref 7.250–7.430)

## 2018-08-08 LAB — COMPREHENSIVE METABOLIC PANEL
ALT: 13 U/L (ref 0–44)
ANION GAP: 25 — AB (ref 5–15)
AST: 13 U/L — ABNORMAL LOW (ref 15–41)
Albumin: 4.5 g/dL (ref 3.5–5.0)
Alkaline Phosphatase: 123 U/L (ref 38–126)
BUN: 49 mg/dL — ABNORMAL HIGH (ref 8–23)
CHLORIDE: 99 mmol/L (ref 98–111)
CO2: 16 mmol/L — ABNORMAL LOW (ref 22–32)
Calcium: 9.7 mg/dL (ref 8.9–10.3)
Creatinine, Ser: 1.98 mg/dL — ABNORMAL HIGH (ref 0.44–1.00)
GFR calc Af Amer: 26 mL/min — ABNORMAL LOW (ref >60)
GFR, EST NON AFRICAN AMERICAN: 23 mL/min — AB (ref >60)
Glucose, Bld: 707 mg/dL (ref 70–99)
POTASSIUM: 5.3 mmol/L — AB (ref 3.5–5.1)
Sodium: 140 mmol/L (ref 135–145)
Total Bilirubin: 1.2 mg/dL (ref 0.3–1.2)
Total Protein: 9.1 g/dL — ABNORMAL HIGH (ref 6.5–8.1)

## 2018-08-08 LAB — GLUCOSE, CAPILLARY
GLUCOSE-CAPILLARY: 441 mg/dL — AB (ref 70–99)
Glucose-Capillary: 349 mg/dL — ABNORMAL HIGH (ref 70–99)
Glucose-Capillary: 283 mg/dL — ABNORMAL HIGH (ref 70–99)
Glucose-Capillary: 277 mg/dL — ABNORMAL HIGH (ref 70–99)

## 2018-08-08 LAB — BETA-HYDROXYBUTYRIC ACID: BETA-HYDROXYBUTYRIC ACID: 4.11 mmol/L — AB (ref 0.05–0.27)

## 2018-08-08 LAB — PROTIME-INR
INR: 1.01
Prothrombin Time: 13.2 seconds (ref 11.4–15.2)

## 2018-08-08 LAB — BRAIN NATRIURETIC PEPTIDE: B NATRIURETIC PEPTIDE 5: 34.1 pg/mL (ref 0.0–100.0)

## 2018-08-08 LAB — APTT: APTT: 20 s — AB (ref 24–36)

## 2018-08-08 LAB — MRSA PCR SCREENING: MRSA by PCR: NEGATIVE

## 2018-08-08 LAB — INFLUENZA PANEL BY PCR (TYPE A & B)
Influenza A By PCR: NEGATIVE
Influenza B By PCR: NEGATIVE

## 2018-08-08 LAB — I-STAT TROPONIN, ED: Troponin i, poc: 0.07 ng/mL (ref 0.00–0.08)

## 2018-08-08 LAB — TSH: TSH: 4.202 u[IU]/mL (ref 0.350–4.500)

## 2018-08-08 LAB — LIPASE, BLOOD: LIPASE: 83 U/L — AB (ref 11–51)

## 2018-08-08 MED ORDER — UMECLIDINIUM BROMIDE 62.5 MCG/INH IN AEPB
1.0000 | INHALATION_SPRAY | Freq: Every day | RESPIRATORY_TRACT | Status: DC
  Administered 2018-08-09 – 2018-08-16 (×7): 1 via RESPIRATORY_TRACT
  Filled 2018-08-08: qty 7

## 2018-08-08 MED ORDER — DEXTROSE 50 % IV SOLN: 25.0000 mL | INTRAVENOUS | Status: DC | PRN

## 2018-08-08 MED ORDER — OXYBUTYNIN CHLORIDE ER 5 MG PO TB24
10.0000 mg | ORAL_TABLET | Freq: Every day | ORAL | Status: DC
  Administered 2018-08-09 – 2018-08-16 (×7): 10 mg via ORAL
  Filled 2018-08-08 (×2): qty 2
  Filled 2018-08-08: qty 1
  Filled 2018-08-08 (×2): qty 2
  Filled 2018-08-08: qty 1
  Filled 2018-08-08: qty 2
  Filled 2018-08-08: qty 1

## 2018-08-08 MED ORDER — SODIUM CHLORIDE 0.9 % IV SOLN: 1.0000 g | INTRAVENOUS | Status: DC

## 2018-08-08 MED ORDER — ADULT MULTIVITAMIN W/MINERALS CH
1.0000 | ORAL_TABLET | Freq: Every day | ORAL | Status: DC
  Administered 2018-08-12 – 2018-08-16 (×5): 1 via ORAL
  Filled 2018-08-08 (×7): qty 1

## 2018-08-08 MED ORDER — SODIUM CHLORIDE 0.9 % IV SOLN: 1000.0000 mL | INTRAVENOUS | Status: DC

## 2018-08-08 MED ORDER — ONDANSETRON HCL 4 MG/2ML IJ SOLN
4.0000 mg | Freq: Four times a day (QID) | INTRAMUSCULAR | Status: DC | PRN
  Administered 2018-08-08: 4 mg via INTRAVENOUS
  Filled 2018-08-08: qty 2

## 2018-08-08 MED ORDER — INSULIN REGULAR BOLUS VIA INFUSION
0.0000 [IU] | Freq: Three times a day (TID) | INTRAVENOUS | Status: DC
  Filled 2018-08-08: qty 10

## 2018-08-08 MED ORDER — SIMVASTATIN 20 MG PO TABS
20.0000 mg | ORAL_TABLET | Freq: Every evening | ORAL | Status: DC
  Administered 2018-08-08 – 2018-08-15 (×7): 20 mg via ORAL
  Filled 2018-08-08: qty 1
  Filled 2018-08-08: qty 2
  Filled 2018-08-08: qty 1
  Filled 2018-08-08: qty 2
  Filled 2018-08-08 (×6): qty 1

## 2018-08-08 MED ORDER — DEXTROSE-NACL 5-0.45 % IV SOLN
INTRAVENOUS | Status: DC
  Administered 2018-08-09: 01:00:00 via INTRAVENOUS

## 2018-08-08 MED ORDER — DONEPEZIL HCL 5 MG PO TABS
5.0000 mg | ORAL_TABLET | Freq: Every day | ORAL | Status: DC
  Administered 2018-08-09 – 2018-08-16 (×7): 5 mg via ORAL
  Filled 2018-08-08 (×8): qty 1

## 2018-08-08 MED ORDER — VANCOMYCIN HCL IN DEXTROSE 750-5 MG/150ML-% IV SOLN: 750.0000 mg | INTRAVENOUS | Status: DC

## 2018-08-08 MED ORDER — DILTIAZEM HCL ER BEADS 120 MG PO CP24: 120.0000 mg | ORAL_CAPSULE | Freq: Every day | ORAL | Status: DC

## 2018-08-08 MED ORDER — VANCOMYCIN HCL 10 G IV SOLR
1500.0000 mg | Freq: Once | INTRAVENOUS | Status: AC
  Administered 2018-08-08: 1500 mg via INTRAVENOUS
  Filled 2018-08-08: qty 1500

## 2018-08-08 MED ORDER — METOPROLOL SUCCINATE ER 25 MG PO TB24
25.0000 mg | ORAL_TABLET | Freq: Every day | ORAL | Status: DC
  Administered 2018-08-08 – 2018-08-16 (×8): 25 mg via ORAL
  Filled 2018-08-08 (×9): qty 1

## 2018-08-08 MED ORDER — SODIUM CHLORIDE 0.9 % IV SOLN: INTRAVENOUS | Status: DC

## 2018-08-08 MED ORDER — SODIUM CHLORIDE 0.9 % IV BOLUS (SEPSIS)
1000.0000 mL | Freq: Once | INTRAVENOUS | Status: AC
  Administered 2018-08-08: 1000 mL via INTRAVENOUS

## 2018-08-08 MED ORDER — MONTELUKAST SODIUM 10 MG PO TABS
10.0000 mg | ORAL_TABLET | Freq: Every day | ORAL | Status: DC
  Administered 2018-08-08 – 2018-08-15 (×7): 10 mg via ORAL
  Filled 2018-08-08 (×7): qty 1

## 2018-08-08 MED ORDER — LEVOTHYROXINE SODIUM 50 MCG PO TABS
50.0000 ug | ORAL_TABLET | Freq: Every day | ORAL | Status: DC
  Administered 2018-08-09 – 2018-08-16 (×8): 50 ug via ORAL
  Filled 2018-08-08 (×8): qty 1

## 2018-08-08 MED ORDER — SODIUM CHLORIDE 0.9 % IV BOLUS
500.0000 mL | Freq: Once | INTRAVENOUS | Status: AC
  Administered 2018-08-08: 500 mL via INTRAVENOUS

## 2018-08-08 MED ORDER — VITAMIN D 25 MCG (1000 UNIT) PO TABS
2000.0000 [IU] | ORAL_TABLET | Freq: Every day | ORAL | Status: DC
  Administered 2018-08-09 – 2018-08-16 (×7): 2000 [IU] via ORAL
  Filled 2018-08-08: qty 2

## 2018-08-08 MED ORDER — SODIUM CHLORIDE 0.9 % IV SOLN
2.0000 g | Freq: Once | INTRAVENOUS | Status: AC
  Administered 2018-08-08: 2 g via INTRAVENOUS
  Filled 2018-08-08: qty 2

## 2018-08-08 MED ORDER — SODIUM CHLORIDE 0.9 % IV BOLUS: 1000.0000 mL | Freq: Once | INTRAVENOUS | Status: DC

## 2018-08-08 MED ORDER — PANTOPRAZOLE SODIUM 40 MG PO TBEC
40.0000 mg | DELAYED_RELEASE_TABLET | Freq: Every day | ORAL | Status: DC
  Administered 2018-08-09 – 2018-08-16 (×7): 40 mg via ORAL
  Filled 2018-08-08 (×8): qty 1

## 2018-08-08 MED ORDER — TIOTROPIUM BROMIDE MONOHYDRATE 18 MCG IN CAPS: 18.0000 ug | ORAL_CAPSULE | Freq: Every day | RESPIRATORY_TRACT | Status: DC

## 2018-08-08 MED ORDER — INSULIN REGULAR(HUMAN) IN NACL 100-0.9 UT/100ML-% IV SOLN
INTRAVENOUS | Status: DC
  Administered 2018-08-08: 5.4 [IU]/h via INTRAVENOUS
  Filled 2018-08-08: qty 100

## 2018-08-08 MED ORDER — ALBUTEROL SULFATE (2.5 MG/3ML) 0.083% IN NEBU: 3.0000 mL | INHALATION_SOLUTION | Freq: Four times a day (QID) | RESPIRATORY_TRACT | Status: DC | PRN

## 2018-08-08 MED ORDER — ENOXAPARIN SODIUM 30 MG/0.3ML ~~LOC~~ SOLN
30.0000 mg | SUBCUTANEOUS | Status: DC
  Administered 2018-08-08 – 2018-08-11 (×4): 30 mg via SUBCUTANEOUS
  Filled 2018-08-08 (×4): qty 0.3

## 2018-08-08 MED ORDER — GUAIFENESIN 100 MG/5ML PO SYRP
100.0000 mg | ORAL_SOLUTION | Freq: Every day | ORAL | Status: DC | PRN
  Filled 2018-08-08: qty 5

## 2018-08-08 NOTE — ED Provider Notes (Signed)
[facility]-EMERGENCY DEPT Provider Note   CSN: [id number] Arrival date & time: 08/08/18  1559     History   Chief Complaint Chief Complaint  Patient presents with  . Hyperglycemia    HPI [name] is a 77 y.o. female.  HPI Patient arrives generally very ill.  Family members have been calling and not getting any response.  They went to check on her and found her on the couch likely not having moved for a while.  At this time, family members are not present to give additional history.  Patient is brought in by EMS.  Patient is very fatigued and ill in appearance but is answering questions.  She reports she feels generalized pain and achiness.  She reports that she started feeling sick 2 weeks ago.  She does not identify specifically what the symptom was.  She denies that she has had vomiting or diarrhea.  She does report that she has been urinating a lot.  At this time, patient does not endorse chest pain or shortness of breath.  She denies any recall of any physical injury or fall.  She cannot seem to recall her use of diabetic medications or insulin.   Patient's son has arrived.  He reports his mother has been gradually getting ill.  He reports 2 weeks ago they were at a family get together and around a lot of people.  She started with some upper respiratory symptoms and they assumed "flu or cold".  She was seen by her doctor in the emergency department about a week ago.  She was told she had bronchitis.  Patient was started on prednisone.  Her son reports she seemed to be getting better for a day or so but then started to seem worse again.  He reports that he checked her medications and found that there were out of date medications mixed in with other medications that she was taking out of sequence.  He went to the pharmacist and they reviewed and reordered all of her medications.  She is not on insulin.  He reports if anything, he thinks that she might have  taken additional doses of medication rather than not taken them.  He reports this morning when he went into check on her she was very confused and told him to go away.  He reports she was lying on the couch just like she would be any other time.  No sign of injury or trauma.  He reports that she called him a [employer] and told him to leave.  He reports that once EMS got there she also said to get all of those telemarketers out of her house. Past Medical History:  Diagnosis Date  . Asthma   . Bronchitis   . COPD (chronic obstructive pulmonary disease) (HCC)   . Diabetes mellitus   . Fibromyalgia   . Hyperlipidemia   . Hypertension   . Osteoporosis   . Rheumatoid aortitis   . Sleep apnea     Patient Active Problem List   Diagnosis Date Noted  . History of hyperlipidemia 12/26/2017  . History of atrial fibrillation 12/26/2017  . History of diabetes mellitus 12/26/2017  . History of hypothyroidism 12/26/2017  . History of sleep apnea 12/26/2017  . History of gastroesophageal reflux (GERD) 12/26/2017  . History of asthma 12/26/2017  . On prednisone therapy 05/04/2017  . Chronic left shoulder pain 01/12/2017  . Hx of cardiac cath 12/18/2016  . Chronic pain of right knee  12/07/2016  . Primary osteoarthritis of both hands 12/07/2016  . Chronic right shoulder pain 12/07/2016  . History of osteopenia 12/07/2016  . 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 Atrial fibrillation (HCC) 10/24/2016  . OSA (obstructive sleep apnea) 05/15/2008  . HYPOTHYROIDISM 04/03/2007  . Diabetes mellitus (HCC) 04/03/2007  . HYPERLIPIDEMIA 04/03/2007  . ASTHMA 04/03/2007  . GERD 04/03/2007  . Primary osteoarthritis of right knee 04/03/2007  . DDD (degenerative disc disease), lumbar 04/03/2007  . DDD (degenerative disc disease), cervical 04/03/2007  . Fibromyalgia 04/03/2007  . 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 TACHYCARDIA, HX OF 04/03/2007  . STATUS, KNEE JOINT REPLACEMENT 04/03/2007     Past Surgical History:  Procedure Laterality Date  . JOINT REPLACEMENT     left total knee  . KNEE ARTHROPLASTY    . TOTAL KNEE ARTHROPLASTY       OB History   None      Home Medications    Prior to Admission medications   Medication Sig Start Date End Date Taking? Authorizing Provider  albuterol (PROVENTIL HFA;VENTOLIN HFA) 108 (90 Base) MCG/ACT inhaler Inhale 1-2 puffs into the lungs every 6 (six) hours as needed for wheezing or shortness of breath.    [provider]  aspirin 81 MG chewable tablet Chew 81 mg by mouth See admin instructions. Monday Wednesday Friday    [provider]  cycloSPORINE (RESTASIS) 0.05 % ophthalmic emulsion 1 drop 2 (two) times daily.    [provider]  diclofenac sodium (VOLTAREN) 1 % GEL Apply 3 grams to 3 large joints up to three times daily. 03/12/18   Gearldine Bienenstock, PA-C  Lancets [facility] ULTRASOFT) lancets  09/17/16   [provider]  levothyroxine (SYNTHROID, LEVOTHROID) 50 MCG tablet Take 50 mcg by mouth daily before breakfast. 11/23/16   [provider]  meclizine (ANTIVERT) 12.5 MG tablet Take 12.5 mg by mouth 3 (three) times daily as needed for dizziness. 06/12/16   [provider]  metFORMIN (GLUCOPHAGE-XR) 500 MG 24 hr tablet Take 250 mg by mouth daily. 12/07/16   [provider]  metoprolol succinate (TOPROL-XL) 25 MG 24 hr tablet Take 25 mg by mouth daily. 12/07/16   [provider]  montelukast (SINGULAIR) 10 MG tablet Take 10 mg by mouth daily. 07/28/16   [provider]  Multiple Vitamin (MULITIVITAMIN WITH MINERALS) TABS Take 1 tablet by mouth daily.    [provider]  omeprazole (PRILOSEC) 20 MG capsule Take 20 mg by mouth daily.    [provider]  omeprazole (PRILOSEC) 40 MG capsule Take 40 mg by mouth daily. 12/01/17   [provider]  ONE TOUCH ULTRA TEST test strip  09/17/16   [provider]  oxybutynin  (DITROPAN-XL) 10 MG 24 hr tablet Take 10 mg by mouth daily.  12/28/16   [provider]  polyvinyl alcohol (LIQUIFILM TEARS) 1.4 % ophthalmic solution Place 2 drops into both eyes as needed for dry eyes.     [provider]  RA VITAMIN D-3 2000 units CAPS Take 1 capsule by mouth daily. 09/15/16   [provider]  simvastatin (ZOCOR) 20 MG tablet Take 20 mg by mouth every evening.    [provider]  TAZTIA XT 120 MG 24 hr capsule Take 120 mg by mouth daily. 12/07/16   [provider]  tiotropium (SPIRIVA) 18 MCG inhalation capsule Place 18 mcg into inhaler and inhale daily.    [provider]  traMADol-acetaminophen (  ULTRACET) 37.5-325 MG tablet Take 2 tablets by mouth three times a day if needed 07/19/18   Gearldine Bienenstock, PA-C    Family History Family History  Problem Relation Age of Onset  . Diabetes Mother   . Heart failure Mother   . Cancer Mother   . Heart failure Father   . Cancer Sister   . Cancer Sister   . Cancer Brother   . Multiple sclerosis Son     Social History Social History   Tobacco Use  . Smoking status: Former Smoker    Packs/day: 1.00    Years: 30.00    Pack years: 30.00    Types: Cigarettes    Last attempt to quit: 01/11/1996    Years since quitting: 22.5  . Smokeless tobacco: Never Used  Substance Use Topics  . Alcohol use: Yes    Comment: OCC WINE  . Drug use: No     Allergies   Codeine; Darvon [propoxyphene hcl]; Fentanyl; Oxycodone hcl; Propoxyphene n-acetaminophen; Valium [diazepam]; and Valsartan   Review of Systems Review of Systems 10 Systems reviewed and are negative for acute change except as noted in the HPI.  Physical Exam Updated Vital Signs Pulse (!) 124   Temp 99.4 F (37.4 C) (Rectal)   Resp 18   SpO2 98%   Physical Exam  Constitutional:  Patient is fatigued and ill in appearance.  She does not have any respiratory distress.  She smells of ketones.  HENT:  Head:  Normocephalic and atraumatic.  Mucous membranes are very dry.  Posterior airway is patent.  Eyes: Pupils are equal, round, and reactive to light. EOM are normal.  Neck: Neck supple.  Cardiovascular:  Tachycardia.  Monitor shows sinus rhythm in the 120s.  No gross rub murmur gallop.  Distal pulses are 1+.  Pulmonary/Chest: Effort normal and breath sounds normal. No respiratory distress.  Abdominal:  Abdomen is soft.  Nondistended.  No guarding.  Patient endorses slight discomfort over the bladder.  Musculoskeletal:  No peripheral edema.  Calves are soft nontender.  No wounds or swelling.  No sign of cellulitis.  No extremity deformities or significant bruising.  Neurological:  Patient is awake and responding appropriately to questions.  She does seem extremely fatigued.  She follows commands to do grip strength bilaterally.  She can independently move each lower extremity.  She has a nonfocal neurologic exam.  Skin: Skin is warm and dry.  Psychiatric: She has a normal mood and affect.     ED Treatments / Results  Labs (all labs ordered are listed, but only abnormal results are displayed) Labs Reviewed  BLOOD GAS, VENOUS - Abnormal; Notable for the following components:      Result Value   pCO2, Ven 34.5 (*)    Bicarbonate 17.1 (*)    Acid-base deficit 7.7 (*)    All other components within normal limits  CBG MONITORING, ED - Abnormal; Notable for the following components:   Glucose-Capillary >600 (*)    All other components within normal limits  I-STAT CG4 LACTIC ACID, ED - Abnormal; Notable for the following components:   Lactic Acid, Venous 3.59 (*)    All other components within normal limits  I-STAT CHEM 8, ED - Abnormal; Notable for the following components:   Potassium 5.2 (*)    BUN 56 (*)    Creatinine, Ser 1.60 (*)    Glucose, Bld >700 (*)    Calcium, Ion 1.08 (*)    TCO2 18 (*)  Hemoglobin 15.3 (*)    All other components within normal limits  CULTURE, BLOOD  (ROUTINE X 2)  CULTURE, BLOOD (ROUTINE X 2)  URINE CULTURE  COMPREHENSIVE METABOLIC PANEL  CBC WITH DIFFERENTIAL/PLATELET  URINALYSIS, ROUTINE W REFLEX MICROSCOPIC  LIPASE, BLOOD  BRAIN NATRIURETIC PEPTIDE  APTT  PROTIME-INR  I-STAT TROPONIN, ED    EKG EKG Interpretation  Date/Time:  Wednesday August 08 2018 16:39:26 EST Ventricular Rate:  121 PR Interval:    QRS Duration: 90 QT Interval:  302 QTC Calculation: 429 R Axis:   18 Text Interpretation:  Age not entered, assumed to be  77 years old for purpose of ECG interpretation Sinus tachycardia LAE, consider biatrial enlargement Abnormal R-wave progression, early transition Confirmed by Arby Barrette [phone]) on 08/08/2018 4:49:55 PM   Radiology No results found.  Procedures Procedures (including critical care time) CRITICAL CARE Performed by: Arby Barrette   Total critical care time: 30 minutes  Critical care time was exclusive of separately billable procedures and treating other patients.  Critical care was necessary to treat or prevent imminent or life-threatening deterioration.  Critical care was time spent personally by me on the following activities: development of treatment plan with patient and/or surrogate as well as nursing, discussions with consultants, evaluation of patient's response to treatment, examination of patient, obtaining history from patient or surrogate, ordering and performing treatments and interventions, ordering and review of laboratory studies, ordering and review of radiographic studies, pulse oximetry and re-evaluation of patient's condition. Medications Ordered in ED Medications  sodium chloride 0.9 % bolus 1,000 mL (1,000 mLs Intravenous New Bag/Given 08/08/18 1716)    Followed by  0.9 %  sodium chloride infusion (has no administration in time range)  dextrose 5 %-0.45 % sodium chloride infusion (has no administration in time range)  insulin regular bolus via infusion 0-10 Units (has  no administration in time range)  insulin regular, human (MYXREDLIN) 100 units/ 100 mL infusion (has no administration in time range)  dextrose 50 % solution 25 mL (has no administration in time range)  0.9 %  sodium chloride infusion (has no administration in time range)     Initial Impression / Assessment and Plan / ED Course  I have reviewed the triage vital signs and the nursing notes.  Pertinent labs & imaging results that were available during my care of the patient were reviewed by me and considered in my medical decision making (see chart for details).  Clinical Course as of Aug 08 1836  Wed Aug 08, 2018  1836 Patient remains arousable.  Respirations are nonlabored.  Blood pressures are stable.  Still pending for urinalysis.  Insulin and fluids are running.   [MP]    Clinical Course User Index [MP] Arby Barrette, MD   Consult: Reviewed with [facility] hospitalist for admission Patient presents acutely ill.  She is severely hyperglycemic with anion gap and confusion.  At this time, this appears most consistent with diabetic hyperosmolar coma.  Although the patient is confused, she is appropriately responsive and answering questions.  She is somnolent but airway is well protected without any sonorous respirations.  Blood pressures are stable.  Fluids and insulin have been initiated.  At this time, I have been awaiting source of infection for antibiotics.  Urinalysis is still pending.  History suggests URI symptoms consistent with bronchitis.  Still no pneumonia on chest x-ray.  Will add influenza testing.  Patient is afebrile without hypotension.  Admit to hospitalist service.   Final Clinical Impressions(s) /  ED Diagnoses   Final diagnoses:  Dehydration  AKI (acute kidney injury) (HCC)  Diabetic hyperosmolar non-ketotic state [facility])    ED Discharge Orders    None       Arby Barrette, MD 08/08/18 [phone]

## 2018-08-08 NOTE — ED Notes (Signed)
Bed: WA06 Expected date:  Expected time:  Means of arrival:  Comments: EMS/hyperglycemia <l.o.c.

## 2018-08-08 NOTE — ED Triage Notes (Signed)
Per EMS, pt from home, family reports pt have been sick x 2 days.  Lives alone, family found pt on the couch, lethargic.  Pt is A&OX4.   No complaints other than feeling sick. EMS CBG meter registers "HIGH"

## 2018-08-08 NOTE — ED Notes (Signed)
Family at bedside. 

## 2018-08-08 NOTE — ED Notes (Signed)
Pt's son reports yesterday pt was coughing and her memory was impaired. Today, when he checked on her today, she would not answer his calls.  So, he went to her house, he wouldn't come to the door, telling him to go away, thinking he was a [employer]er[employer].  He had to force his way in her house.  Found her on the couch.  He called her PCP and instructed him to call 911.

## 2018-08-08 NOTE — H&P (Signed)
History and Physical    [name] ZOX:[id number] DOB: 27-Aug-1941 DOA: 08/08/2018  PCP: Gwenyth Bender, MD Patient coming from: Home  Chief Complaint: Hyperglycemia, AMS  HPI: [name] is a 77 y.o. female with medical history significant of dementia, type 2 diabetes, asthma, COPD, hypertension, hyperlipidemia presenting to the hospital via EMS for evaluation of hyperglycemia and AMS.  Patient was somnolent, history provided by family at bedside.  Son states that the patient had URI symptoms 2 weeks ago for which she went to the ED 6 days ago and was prescribed prednisone and inhalers.  States she initially improved but then decompensated again.  When family went to check on her today, she was found on the couch and was incoherent.  She did not recognize her family.  Son states that the patient does not take her medications correctly and thinks she might be taking old medications.  ED Course: Afebrile, tachycardic, tachypneic, not hypotensive, and satting well on room air.  Blood glucose greater than 700.  Bicarb 16 and anion gap 25.  VBG showing pH 7.31.  Lactic acid 3.59.  White count 19.8.  Flu panel pending. Lipase mildly elevated (83).  BNP 34.  Serum creatinine 1.9, was 0.8 in January 2018 per care everywhere.  I-STAT troponin negative and EKG not suggestive of ACS.  Chest x-ray showing no active disease.  Patient received 1.5 L normal saline boluses in the ED and was started on insulin infusion.  TRH paged to admit.  Review of Systems: As per HPI otherwise 10 point review of systems negative.  Past Medical History:  Diagnosis Date  . Asthma   . Bronchitis   . COPD (chronic obstructive pulmonary disease) (HCC)   . Diabetes mellitus   . Fibromyalgia   . Hyperlipidemia   . Hypertension   . Osteoporosis   . Rheumatoid aortitis   . Sleep apnea     Past Surgical History:  Procedure Laterality Date  . JOINT REPLACEMENT     left total knee  . KNEE ARTHROPLASTY    .  TOTAL KNEE ARTHROPLASTY       reports that she quit smoking about 22 years ago. Her smoking use included cigarettes. She has a 30.00 pack-year smoking history. She has never used smokeless tobacco. She reports that she drinks alcohol. She reports that she does not use drugs.  Allergies  Allergen Reactions  . Codeine Nausea And Vomiting and Other (See Comments)    Hallucinations   . Darvon [Propoxyphene Hcl]   . Fentanyl Other (See Comments)    Pt reports duragesic patches cause her to hallucinate.   . Oxycodone Hcl Nausea And Vomiting and Other (See Comments)    Hallucinate   . Propoxyphene N-Acetaminophen   . Valium [Diazepam]   . Valsartan     Family History  Problem Relation Age of Onset  . Diabetes Mother   . Heart failure Mother   . Cancer Mother   . Heart failure Father   . Cancer Sister   . Cancer Sister   . Cancer Brother   . Multiple sclerosis Son     Prior to Admission medications   Medication Sig Start Date End Date Taking? Authorizing Provider  albuterol (PROVENTIL HFA;VENTOLIN HFA) 108 (90 Base) MCG/ACT inhaler Inhale 1-2 puffs into the lungs every 6 (six) hours as needed for wheezing or shortness of breath.   Yes [provider]  aspirin 81 MG chewable tablet Chew 81 mg by mouth See admin  instructions. Monday Wednesday Friday   Yes [provider]  donepezil (ARICEPT) 5 MG tablet Take 5 mg by mouth daily. 08/03/18  Yes [provider]  guaifenesin (ROBITUSSIN) 100 MG/5ML syrup Take 100 mg by mouth daily as needed for cough.   Yes [provider]  levothyroxine (SYNTHROID, LEVOTHROID) 50 MCG tablet Take 50 mcg by mouth daily before breakfast. 11/23/16  Yes [provider]  meclizine (ANTIVERT) 12.5 MG tablet Take 12.5 mg by mouth 3 (three) times daily as needed for dizziness. 06/12/16  Yes [provider]  metFORMIN (GLUCOPHAGE-XR) 500 MG 24 hr tablet Take 250 mg by mouth daily. 12/07/16  Yes [provider]   metoprolol succinate (TOPROL-XL) 25 MG 24 hr tablet Take 25 mg by mouth daily. 12/07/16  Yes [provider]  montelukast (SINGULAIR) 10 MG tablet Take 10 mg by mouth daily. 07/28/16  Yes [provider]  Multiple Vitamin (MULITIVITAMIN WITH MINERALS) TABS Take 1 tablet by mouth daily.   Yes [provider]  omeprazole (PRILOSEC) 40 MG capsule Take 40 mg by mouth daily. 12/01/17  Yes [provider]  oxybutynin (DITROPAN-XL) 10 MG 24 hr tablet Take 10 mg by mouth daily.  12/28/16  Yes [provider]  RA VITAMIN D-3 2000 units CAPS Take 1 capsule by mouth daily. 09/15/16  Yes [provider]  simvastatin (ZOCOR) 20 MG tablet Take 20 mg by mouth every evening.   Yes [provider]  TAZTIA XT 120 MG 24 hr capsule Take 120 mg by mouth daily. 12/07/16  Yes [provider]  tiotropium (SPIRIVA) 18 MCG inhalation capsule Place 18 mcg into inhaler and inhale daily.   Yes [provider]  traMADol-acetaminophen (ULTRACET) 37.5-325 MG tablet Take 2 tablets by mouth three times a day if needed 07/19/18  Yes Gearldine Bienenstock, PA-C  diclofenac sodium (VOLTAREN) 1 % GEL Apply 3 grams to 3 large joints up to three times daily. Patient not taking: Reported on 08/08/2018 03/12/18   Gearldine Bienenstock, PA-C  Lancets [facility] ULTRASOFT) lancets  09/17/16   [provider]  ONE TOUCH ULTRA TEST test strip  09/17/16   [provider]    Physical Exam: Vitals:   08/08/18 1815 08/08/18 1830 08/08/18 1845 08/08/18 1900  BP: (!) 161/77 (!) 170/81 (!) 159/73 134/72  Pulse: (!) 120 (!) 121 (!) 121 (!) 121  Resp: 17 18 17 15   Temp:      TempSrc:      SpO2: 99% 100% 100% 100%    Physical Exam  Constitutional:  Ill-appearing  HENT:  Head: Normocephalic.  Dry mucous membranes  Eyes: Right eye exhibits no discharge. Left eye exhibits no discharge.  Neck: Neck supple. No tracheal deviation present.  Cardiovascular: Normal  rate, regular rhythm and intact distal pulses.  Murmur heard. Systolic murmur appreciated  Pulmonary/Chest: Effort normal. She has no wheezes. She has no rales.  Anterior lung fields clear to auscultation  Abdominal: Soft. Bowel sounds are normal. She exhibits no distension. There is no tenderness.  Musculoskeletal: She exhibits no edema.  Neurological:  Somnolent but waking up and answering questions Oriented to person and place only No slurring of speech No facial droop No focal weakness  Skin: Skin is warm and dry. She is not diaphoretic.     Labs on Admission: I have personally reviewed following labs and imaging studies  CBC: Recent Labs  Lab 08/08/18 1657 08/08/18 1718  WBC  --  19.8*  NEUTROABS  --  15.8*  HGB 15.3* 16.4*  HCT 45.0 53.3*  MCV  --  89.4  PLT  --  300   Basic Metabolic Panel: Recent Labs  Lab 08/08/18 1657 08/08/18 1718  NA 139 140  K 5.2* 5.3*  CL 108 99  CO2  --  16*  GLUCOSE >700* 707*  BUN 56* 49*  CREATININE 1.60* 1.98*  CALCIUM  --  9.7   GFR: Estimated Creatinine Clearance: 22.8 mL/min (A) (by C-G formula based on SCr of 1.98 mg/dL (H)). Liver Function Tests: Recent Labs  Lab 08/08/18 1718  AST 13*  ALT 13  ALKPHOS 123  BILITOT 1.2  PROT 9.1*  ALBUMIN 4.5   Recent Labs  Lab 08/08/18 1718  LIPASE 83*   No results for input(s): AMMONIA in the last 168 hours. Coagulation Profile: Recent Labs  Lab 08/08/18 1718  INR 1.01   Cardiac Enzymes: No results for input(s): CKTOTAL, CKMB, CKMBINDEX, TROPONINI in the last 168 hours. BNP (last 3 results) No results for input(s): PROBNP in the last 8760 hours. HbA1C: No results for input(s): HGBA1C in the last 72 hours. CBG: Recent Labs  Lab 08/08/18 1612 08/08/18 1859  GLUCAP >600* 497*   Lipid Profile: No results for input(s): CHOL, HDL, LDLCALC, TRIG, CHOLHDL, LDLDIRECT in the last 72 hours. Thyroid Function Tests: No results for input(s): TSH, T4TOTAL, FREET4,  T3FREE, THYROIDAB in the last 72 hours. Anemia Panel: No results for input(s): VITAMINB12, FOLATE, FERRITIN, TIBC, IRON, RETICCTPCT in the last 72 hours. Urine analysis:    Component Value Date/Time   COLORURINE YELLOW 06/02/2007 2044   APPEARANCEUR CLOUDY (A) 06/02/2007 2044   LABSPEC 1.013 06/02/2007 2044   PHURINE 6.0 06/02/2007 2044   GLUCOSEU NEGATIVE 06/02/2007 2044   HGBUR NEGATIVE 06/02/2007 2044   BILIRUBINUR NEGATIVE 06/02/2007 2044   KETONESUR NEGATIVE 06/02/2007 2044   PROTEINUR NEGATIVE 06/02/2007 2044   UROBILINOGEN 0.2 06/02/2007 2044   NITRITE NEGATIVE 06/02/2007 2044   LEUKOCYTESUR TRACE (A) 06/02/2007 2044    Radiological Exams on Admission: Dg Chest Port 1 View  Result Date: 08/08/2018 CLINICAL DATA:  77 y/o F; found on couch lethargic, short of breath, weakness, sick for 2 days. EXAM: PORTABLE CHEST 1 VIEW COMPARISON:  08/02/2018 chest radiograph FINDINGS: Stable heart size and mediastinal contours are within normal limits. Both lungs are clear. Mild reverse S curvature of the spine. Osteoarthrosis of the shoulder joints. IMPRESSION: No active disease. Electronically Signed   By: Mitzi Hansen M.D.   On: 08/08/2018 17:37    EKG: Independently reviewed.  Sinus tachycardia (heart rate 121) and left atrial enlargement.  Assessment/Plan Principal Problem:   DKA (diabetic ketoacidoses) (HCC) Active Problems:   Hypothyroidism   HLD (hyperlipidemia)   Asthma   GERD   Sepsis (HCC)   AKI (acute kidney injury) (HCC)   Acute metabolic encephalopathy   COPD (chronic obstructive pulmonary disease) (HCC)   HTN (hypertension)   Dementia (HCC)   Overactive bladder   Physical deconditioning   DKA  No recent A1c in the chart.  Metformin is her only home diabetes medication; not on insulin.  Tachycardic and tachypneic on arrival. Blood glucose greater than 700.  Bicarb 16 and anion gap 25.  VBG showing pH 7.31.  Appears dehydrated.  Patient has an elevated  white count and lactic acidosis but no infectious source apparent at this time. -Monitor in the stepdown unit -Keep n.p.o. at this time -Insulin infusion per DKA protocol -IV fluid resuscitation. Continue on normal saline until  blood glucose less than 250, then switch to D5 one half normal saline -Check beta hydroxybutyrate level.  UA not collected yet. -BMP every 4 hours -Check A1c -When bicarb normal and gap closes x2 plus patient is more awake and alert, start diet and transition to subcutaneous insulin  Sepsis, unclear source Afebrile, tachycardic, tachypneic, not hypotensive, and satting well on room air.   Lactic acid 3.59.  White count 19.8.  Chest x-ray showing no active disease. -Continue IV fluid resuscitation -Start broad-spectrum antibiotics: Vancomycin and cefepime -Continue to trend lactate -Flu panel pending -UA pending, urine culture pending -Blood culture x2 pending -CBC in a.m.  AKI Likely prerenal due to dehydration.  Serum creatinine 1.9, was 0.8 in January 2018 per care everywhere.   -Continue IV fluid resuscitation -Avoid nephrotoxic agents/contrast -Continue to monitor renal function  Acute metabolic encephalopathy Likely related to DKA and sepsis.  No gross focal neuro deficits on exam. -Management as above  Asthma, COPD -Stable.  Not wheezing.  Continue home inhalers.  Hypertension -Blood pressure currently stable. -Continue home metoprolol succinate 25 mg daily -Hold home calcium channel blocker at this time  Hyperlipidemia -Continue home Zocor  Dementia -Continue home Aricept  Hypothyroidism -Continue home Synthroid -Check TSH  Overactive bladder -Continue home Ditropan  GERD -Continue PPI  Physical deconditioning -PT consult  DVT prophylaxis: Lovenox 30 mg daily Code Status: Patient wishes to be full code.  Family at bedside are in agreement. Family Communication: Son and sister at bedside updated. Disposition Plan: Anticipate  discharge to home versus SNF in 1 to 2 days Consults called: None  Admission status: Observation   John Giovanni MD [facility] Pager [phone]  If 7PM-7AM, please contact night-coverage [email or web address] Password [email or web address]  08/08/2018, 8:04 PM

## 2018-08-08 NOTE — ED Notes (Signed)
Date and time results received: 08/08/18 08/08/2018 18:21 (use smartphrase ".now" to insert current time)  Test: Glucose Critical Value: 707  Name of Provider Notified: Pfeiffer

## 2018-08-08 NOTE — Progress Notes (Signed)
Pharmacy Antibiotic Note  [name] is a 77 y.o. female admitted on 08/08/2018 with sepsis.  Pharmacy has been consulted for Vancomycin dosing.  Plan: Cefepime 2gm x1, then 1gm q24hr Vancomycin 1500mg  x1, then 750mg  q48h Daily SCr     Temp (24hrs), Avg:99.4 F (37.4 C), Min:99.4 F (37.4 C), Max:99.4 F (37.4 C)  Recent Labs  Lab 08/08/18 1657 08/08/18 1718  WBC  --  19.8*  CREATININE 1.60* 1.98*  LATICACIDVEN 3.59*  --     Estimated Creatinine Clearance: 22.8 mL/min (A) (by C-G formula based on SCr of 1.98 mg/dL (H)).    Allergies  Allergen Reactions  . Codeine Nausea And Vomiting and Other (See Comments)    Hallucinations   . Darvon [Propoxyphene Hcl]   . Fentanyl Other (See Comments)    Pt reports duragesic patches cause her to hallucinate.   . Oxycodone Hcl Nausea And Vomiting and Other (See Comments)    Hallucinate   . Propoxyphene N-Acetaminophen   . Valium [Diazepam]   . Valsartan    Antimicrobials this admission: 11/6 Cefepime >>  11/6 Vancomycin  >>   Dose adjustments this admission:  Microbiology results: 11/6 BCx: sent         UCx: ordered          Influenza panel: ordered  Thank you for allowing pharmacy to be a part of this patient's care.  13/6 PharmD Pager [phone] 08/08/2018, 8:11 PM

## 2018-08-08 NOTE — ED Notes (Signed)
First set of blood cx obtained

## 2018-08-09 ENCOUNTER — Other Ambulatory Visit: Payer: Self-pay

## 2018-08-09 DIAGNOSIS — A419 Sepsis, unspecified organism: Principal | ICD-10-CM

## 2018-08-09 DIAGNOSIS — E1110 Type 2 diabetes mellitus with ketoacidosis without coma: Secondary | ICD-10-CM

## 2018-08-09 DIAGNOSIS — R3989 Other symptoms and signs involving the genitourinary system: Secondary | ICD-10-CM

## 2018-08-09 DIAGNOSIS — N3281 Overactive bladder: Secondary | ICD-10-CM

## 2018-08-09 DIAGNOSIS — E039 Hypothyroidism, unspecified: Secondary | ICD-10-CM | POA: Diagnosis present

## 2018-08-09 DIAGNOSIS — G4733 Obstructive sleep apnea (adult) (pediatric): Secondary | ICD-10-CM | POA: Diagnosis present

## 2018-08-09 DIAGNOSIS — G9341 Metabolic encephalopathy: Secondary | ICD-10-CM | POA: Diagnosis present

## 2018-08-09 DIAGNOSIS — R5381 Other malaise: Secondary | ICD-10-CM

## 2018-08-09 DIAGNOSIS — R000 Tachycardia, unspecified: Secondary | ICD-10-CM | POA: Diagnosis present

## 2018-08-09 DIAGNOSIS — J45909 Unspecified asthma, uncomplicated: Secondary | ICD-10-CM | POA: Diagnosis not present

## 2018-08-09 DIAGNOSIS — J449 Chronic obstructive pulmonary disease, unspecified: Secondary | ICD-10-CM | POA: Diagnosis present

## 2018-08-09 DIAGNOSIS — M069 Rheumatoid arthritis, unspecified: Secondary | ICD-10-CM | POA: Diagnosis present

## 2018-08-09 DIAGNOSIS — N179 Acute kidney failure, unspecified: Secondary | ICD-10-CM

## 2018-08-09 DIAGNOSIS — E871 Hypo-osmolality and hyponatremia: Secondary | ICD-10-CM | POA: Diagnosis not present

## 2018-08-09 DIAGNOSIS — I10 Essential (primary) hypertension: Secondary | ICD-10-CM

## 2018-08-09 DIAGNOSIS — N390 Urinary tract infection, site not specified: Secondary | ICD-10-CM | POA: Diagnosis present

## 2018-08-09 DIAGNOSIS — E878 Other disorders of electrolyte and fluid balance, not elsewhere classified: Secondary | ICD-10-CM | POA: Diagnosis present

## 2018-08-09 DIAGNOSIS — K219 Gastro-esophageal reflux disease without esophagitis: Secondary | ICD-10-CM | POA: Diagnosis present

## 2018-08-09 DIAGNOSIS — E11649 Type 2 diabetes mellitus with hypoglycemia without coma: Secondary | ICD-10-CM | POA: Diagnosis not present

## 2018-08-09 DIAGNOSIS — F0390 Unspecified dementia without behavioral disturbance: Secondary | ICD-10-CM | POA: Diagnosis present

## 2018-08-09 DIAGNOSIS — D649 Anemia, unspecified: Secondary | ICD-10-CM | POA: Diagnosis not present

## 2018-08-09 DIAGNOSIS — E876 Hypokalemia: Secondary | ICD-10-CM | POA: Diagnosis present

## 2018-08-09 DIAGNOSIS — E785 Hyperlipidemia, unspecified: Secondary | ICD-10-CM | POA: Diagnosis present

## 2018-08-09 DIAGNOSIS — E860 Dehydration: Secondary | ICD-10-CM | POA: Diagnosis present

## 2018-08-09 DIAGNOSIS — E870 Hyperosmolality and hypernatremia: Secondary | ICD-10-CM | POA: Diagnosis present

## 2018-08-09 LAB — PROCALCITONIN: PROCALCITONIN: 0.91 ng/mL

## 2018-08-09 LAB — BASIC METABOLIC PANEL
Anion gap: 10 (ref 5–15)
Anion gap: 8 (ref 5–15)
BUN: 34 mg/dL — ABNORMAL HIGH (ref 8–23)
BUN: 24 mg/dL — ABNORMAL HIGH (ref 8–23)
CALCIUM: 8.6 mg/dL — AB (ref 8.9–10.3)
CHLORIDE: 121 mmol/L — AB (ref 98–111)
CHLORIDE: 110 mmol/L (ref 98–111)
CO2: 20 mmol/L — ABNORMAL LOW (ref 22–32)
CO2: 21 mmol/L — AB (ref 22–32)
CREATININE: 1.28 mg/dL — AB (ref 0.44–1.00)
CREATININE: 1.16 mg/dL — AB (ref 0.44–1.00)
Calcium: 8.3 mg/dL — ABNORMAL LOW (ref 8.9–10.3)
GFR calc non Af Amer: 39 mL/min — ABNORMAL LOW (ref >60)
GFR calc non Af Amer: 44 mL/min — ABNORMAL LOW (ref >60)
GFR, EST AFRICAN AMERICAN: 45 mL/min — AB (ref >60)
GFR, EST AFRICAN AMERICAN: 51 mL/min — AB (ref >60)
Glucose, Bld: 178 mg/dL — ABNORMAL HIGH (ref 70–99)
Glucose, Bld: 297 mg/dL — ABNORMAL HIGH (ref 70–99)
Potassium: 3.4 mmol/L — ABNORMAL LOW (ref 3.5–5.1)
Potassium: 3.5 mmol/L (ref 3.5–5.1)
SODIUM: 151 mmol/L — AB (ref 135–145)
Sodium: 139 mmol/L (ref 135–145)

## 2018-08-09 LAB — GLUCOSE, CAPILLARY
GLUCOSE-CAPILLARY: 145 mg/dL — AB (ref 70–99)
GLUCOSE-CAPILLARY: 155 mg/dL — AB (ref 70–99)
GLUCOSE-CAPILLARY: 165 mg/dL — AB (ref 70–99)
GLUCOSE-CAPILLARY: 304 mg/dL — AB (ref 70–99)
Glucose-Capillary: 139 mg/dL — ABNORMAL HIGH (ref 70–99)
Glucose-Capillary: 162 mg/dL — ABNORMAL HIGH (ref 70–99)
Glucose-Capillary: 162 mg/dL — ABNORMAL HIGH (ref 70–99)
Glucose-Capillary: 162 mg/dL — ABNORMAL HIGH (ref 70–99)
Glucose-Capillary: 155 mg/dL — ABNORMAL HIGH (ref 70–99)
Glucose-Capillary: 148 mg/dL — ABNORMAL HIGH (ref 70–99)
Glucose-Capillary: 330 mg/dL — ABNORMAL HIGH (ref 70–99)

## 2018-08-09 LAB — CBC
HCT: 42.3 % (ref 36.0–46.0)
HEMOGLOBIN: 13 g/dL (ref 12.0–15.0)
MCH: 27.5 pg (ref 26.0–34.0)
MCHC: 30.7 g/dL (ref 30.0–36.0)
MCV: 89.4 fL (ref 80.0–100.0)
PLATELETS: 212 10*3/uL (ref 150–400)
RBC: 4.73 MIL/uL (ref 3.87–5.11)
RDW: 14.5 % (ref 11.5–15.5)
WBC: 25.5 10*3/uL — AB (ref 4.0–10.5)
nRBC: 0 % (ref 0.0–0.2)

## 2018-08-09 LAB — RAPID URINE DRUG SCREEN, HOSP PERFORMED
Amphetamines: NOT DETECTED
BARBITURATES: NOT DETECTED
BENZODIAZEPINES: NOT DETECTED
Cocaine: NOT DETECTED
Opiates: NOT DETECTED
TETRAHYDROCANNABINOL: NOT DETECTED

## 2018-08-09 LAB — LACTIC ACID, PLASMA: Lactic Acid, Venous: 1.7 mmol/L (ref 0.5–1.9)

## 2018-08-09 LAB — CREATININE, SERUM
CREATININE: 1.23 mg/dL — AB (ref 0.44–1.00)
GFR, EST AFRICAN AMERICAN: 47 mL/min — AB (ref >60)
GFR, EST NON AFRICAN AMERICAN: 41 mL/min — AB (ref >60)

## 2018-08-09 LAB — HEMOGLOBIN A1C
HEMOGLOBIN A1C: 10.3 % — AB (ref 4.8–5.6)
Mean Plasma Glucose: 248.91 mg/dL

## 2018-08-09 LAB — URINALYSIS, ROUTINE W REFLEX MICROSCOPIC
Bilirubin Urine: NEGATIVE
Glucose, UA: 50 mg/dL — AB
KETONES UR: 5 mg/dL — AB
Nitrite: NEGATIVE
PH: 5 (ref 5.0–8.0)
Protein, ur: 30 mg/dL — AB
Specific Gravity, Urine: 1.018 (ref 1.005–1.030)

## 2018-08-09 MED ORDER — TRAMADOL-ACETAMINOPHEN 37.5-325 MG PO TABS
1.0000 | ORAL_TABLET | Freq: Three times a day (TID) | ORAL | Status: DC | PRN
  Administered 2018-08-15: 1 via ORAL
  Filled 2018-08-09: qty 1

## 2018-08-09 MED ORDER — POTASSIUM CHLORIDE 10 MEQ/100ML IV SOLN
10.0000 meq | INTRAVENOUS | Status: DC
  Administered 2018-08-09: 10 meq via INTRAVENOUS
  Filled 2018-08-09: qty 100

## 2018-08-09 MED ORDER — SODIUM CHLORIDE 0.9 % IV SOLN
1.0000 g | Freq: Two times a day (BID) | INTRAVENOUS | Status: AC
  Administered 2018-08-09 – 2018-08-14 (×12): 1 g via INTRAVENOUS
  Filled 2018-08-09 (×13): qty 1

## 2018-08-09 MED ORDER — INSULIN ASPART 100 UNIT/ML ~~LOC~~ SOLN
0.0000 [IU] | Freq: Three times a day (TID) | SUBCUTANEOUS | Status: DC
  Administered 2018-08-09: 11 [IU] via SUBCUTANEOUS
  Administered 2018-08-09: 2 [IU] via SUBCUTANEOUS
  Administered 2018-08-10: 5 [IU] via SUBCUTANEOUS
  Administered 2018-08-10 (×2): 11 [IU] via SUBCUTANEOUS
  Administered 2018-08-11: 8 [IU] via SUBCUTANEOUS

## 2018-08-09 MED ORDER — ORAL CARE MOUTH RINSE
15.0000 mL | Freq: Two times a day (BID) | OROMUCOSAL | Status: DC
  Administered 2018-08-10 – 2018-08-16 (×9): 15 mL via OROMUCOSAL

## 2018-08-09 MED ORDER — POTASSIUM CL IN DEXTROSE 5% 20 MEQ/L IV SOLN
20.0000 meq | INTRAVENOUS | Status: DC
  Administered 2018-08-09: 20 meq via INTRAVENOUS
  Filled 2018-08-09: qty 1000

## 2018-08-09 MED ORDER — INSULIN GLARGINE 100 UNIT/ML ~~LOC~~ SOLN
10.0000 [IU] | Freq: Every day | SUBCUTANEOUS | Status: DC
  Administered 2018-08-09: 10 [IU] via SUBCUTANEOUS
  Filled 2018-08-09 (×2): qty 0.1

## 2018-08-09 MED ORDER — INSULIN ASPART 100 UNIT/ML ~~LOC~~ SOLN
0.0000 [IU] | Freq: Every day | SUBCUTANEOUS | Status: DC
  Administered 2018-08-09 – 2018-08-10 (×2): 4 [IU] via SUBCUTANEOUS

## 2018-08-09 NOTE — Progress Notes (Signed)
Inpatient Diabetes Program Recommendations  AACE/ADA: New Consensus Statement on Inpatient Glycemic Control (2015)  Target Ranges:  Prepandial:   less than 140 mg/dL      Peak postprandial:   less than 180 mg/dL (1-2 hours)      Critically ill patients:  140 - 180 mg/dL    Spoke to patient briefly at bedside. Patient reports taking her metformin and checking her glucose everyday. Due to A1c level of 10.3 even if she were taking her medication she will likely need insulin at time of d/c with her age and renal function.  Patient reports that no one currently helps her with her medications but she has family that checks in on her.   Discussed with her her A1c level and that she may need more medication when its time for her to be discharged.   Patient will either need family assistance everyday with medication or need facility help if needed.  Patient would benefit from insulin at time of d/c. If d/cing home will need to meet with family for education.  Thanks,  Christena Deem RN, MSN, BC-ADM Inpatient Diabetes Coordinator Team Pager [phone] (8a-5p)

## 2018-08-09 NOTE — Care Management Note (Signed)
Case Management Note  Patient Details  Name: [name] MRN: [id number] Date of Birth: [birth year]  Subjective/Objective:                  dka and insulin drip  Action/Plan: Following for progression of care. Following for cm needs none present at this time.  Expected Discharge Date:                  Expected Discharge Plan:  Home/Self Care  In-House Referral:     Discharge planning Services  CM Consult  Post Acute Care Choice:    Choice offered to:     DME Arranged:    DME Agency:     HH Arranged:    HH Agency:     Status of Service:  In process, will continue to follow  If discussed at Long Length of Stay Meetings, dates discussed:    Additional Comments:  Golda Acre, RN 08/09/2018, 9:39 AM

## 2018-08-09 NOTE — Progress Notes (Signed)
PROGRESS NOTE    [name]  MLY:[id number] DOB: 06-Sep-1941 DOA: 08/08/2018 PCP: Gwenyth Bender, MD  Brief Narrative:  HPI per Dr. John Giovanni on 08/08/18 HPI: [name] is a 77 y.o. female with medical history significant of dementia, type 2 diabetes, asthma, COPD, hypertension, hyperlipidemia presenting to the hospital via EMS for evaluation of hyperglycemia and AMS.  Patient was somnolent, history provided by family at bedside.  Son states that the patient had URI symptoms 2 weeks ago for which she went to the ED 6 days ago and was prescribed prednisone and inhalers.  States she initially improved but then decompensated again.  When family went to check on her today, she was found on the couch and was incoherent.  She did not recognize her family.  Son states that the patient does not take her medications correctly and thinks she might be taking old medications.  ED Course: Afebrile, tachycardic, tachypneic, not hypotensive, and satting well on room air.  Blood glucose greater than 700.  Bicarb 16 and anion gap 25.  VBG showing pH 7.31.  Lactic acid 3.59.  White count 19.8.  Flu panel pending. Lipase mildly elevated (83).  BNP 34.  Serum creatinine 1.9, was 0.8 in January 2018 per care everywhere.  I-STAT troponin negative and EKG not suggestive of ACS.  Chest x-ray showing no active disease.  Patient received 1.5 L normal saline boluses in the ED and was started on insulin infusion.  TRH paged to admit.  **Early this morning the patient was transitioned to long-acting insulin and was given at 6 AM this morning.  Insulin drip was doing this morning when I walked in so it was discontinued and patient was allowed to eat as she is hungry.  Electrolyte were off so they are replete.  No urinalysis and urine cultures collected so one has been sent and patient has been complaining of some burning discomfort in her urine.  Likely this is a source of her infection as a cause of her  DKA  Assessment & Plan:   Principal Problem:   DKA (diabetic ketoacidoses) (HCC) Active Problems:   Hypothyroidism   HLD (hyperlipidemia)   Asthma   GERD   Sepsis (HCC)   AKI (acute kidney injury) (HCC)   Acute metabolic encephalopathy   COPD (chronic obstructive pulmonary disease) (HCC)   HTN (hypertension)   Dementia (HCC)   Overactive bladder   Physical deconditioning  DKA  -No recent A1c in the chart but one done this visit showed HbA1c of 10.3  -Metformin is her only home diabetes medication; not on insulin.   -Tachycardic and Tachypneic on arrival.  -Blood glucose greater than 700.   -Bicarb 16 and anion gap 25 on Admission.  -VBG showing pH 7.316/34.5/44.3   -Appeared dehydrated.  Patient has an elevated white count and lactic acidosis; Infectious source could be urine -Continue to Monitor in the stepdown unit -Diet Advanced to Heart Healthy/Carb Modified and Insulin infusion per DKA protocol stopped -IV fluid resuscitation. Continude on normal saline until blood glucose less than 250, then switch to D5 one half normal saline and then just to D5W; D5W no stopped as patient is eating  -Check Beta-Hydroxybutyrate level was 4.11.   -UA as below -BMP every 4 hours -Transitioned to Long Acting Insulin 10 units and Moderate Novolog SSI AC/HS -UDS was Negative   Sepsis from suspected UTI -Afebrile, tachycardic, tachypneic, not hypotensive, and satting well on room air.      -  White count was 19.8 and 25.5.  -Chest x-ray showing no active disease. -Continue IV fluid resuscitation -Start broad-spectrum antibiotics:  -Vancomycin and Cefepime -Continue to Trend Lactate; LA went from 3.59 -> 1.7 -Procalcitonin 0.91 -Flu panel pending -UA showed Cloudy Appearance, Large Leukocytes, Nitrite, Few Bacteria, 21-50 RBC/HPF, and >50 WBC -Blood culture x2 showed NGTD <24 Hours -Continue to Monitor and follow Cx's  -Repeat CBC in AM   Acute Metabolic Encephalopathy -Likely  related to DKA and sepsis.   -No gross focal neuro deficits on exam. -Management as above  Asthma and COPD -Stable.   -Not wheezing.   -Continue home Inhalers with Incruse Ellipta and PRN Albuterol 3 mL q6hprn Wheezing and SOB  Hypertension -Blood pressure currently stable. -Continue home Metoprolol succinate 25 mg daily -Hold home calcium channel blocker at this time -Continue to Monitor BP's per protocol   Hyperlipidemia -Continue home Simvastatin 20 mg po qHS  Dementia -Continue home Donepezil 5 mg po Daily   Hypothyroidism -Continue home Levothyroxine 50 mcg po Daily  -Checked TSH and was 4.202  Overactive Badder -Continue home Oxybutynin 10 mg po Daily   GERD -Continue PPI with Pantoprazole 40 mg po Daily  Physical deconditioning/Generalized Weakness -PT to Evaluate and Treat; Recommending SNF vs. HHPT  Hypernatremia/Hyperchloremia -In the setting of IV fluid hydration with normal saline -Fluids were changed to D5W however now stopped giving her hypoglycemia -Continue monitor and trend and repeat BMP later today and repeat CMP in a.m.  Hypokalemia -Patient potassium level was 3.4 -Replete -Continue monitor and replete as necessary -Repeat CMP in a.m.  AKI in the setting of dehydration from DKA and sepsis -Likely prerenal due to dehydration.  Serum creatinine 1.9, was 0.8 in January 2018 per care everywhere.   -Avoid nephrotoxic agents/contrast if possible  -Patient's BUN/creatinine on admission was 49/1.98 and after fluid hydration improved to 34/1.28 -IV fluid has been changed to D5W and patient is now eating solid is been discontinued -Continue to monitor and repeat CMP in a.m. -If not improving will obtain renal ultrasound, urine lites, and further work-up -Repeat CMP in the a.m.  DVT prophylaxis: Enoxaparin 30 mg sq q24h Code Status: FULL CODE Family Communication: No family present at bedside Disposition Plan:   Consultants:    None   Procedures:  None   Antimicrobials:  Anti-infectives (From admission, onward)   Start     Dose/Rate Route Frequency Ordered Stop   08/10/18 2200  vancomycin (VANCOCIN) IVPB 750 mg/150 ml premix  Status:  Discontinued     750 mg 150 mL/hr over 60 Minutes Intravenous Every 48 hours 08/08/18 2057 08/09/18 0903   08/09/18 2100  ceFEPIme (MAXIPIME) 1 g in sodium chloride 0.9 % 100 mL IVPB  Status:  Discontinued     1 g 200 mL/hr over 30 Minutes Intravenous Every 24 hours 08/08/18 2057 08/09/18 1028   08/09/18 1030  ceFEPIme (MAXIPIME) 1 g in sodium chloride 0.9 % 100 mL IVPB     1 g 200 mL/hr over 30 Minutes Intravenous Every 12 hours 08/09/18 1028     08/08/18 2200  vancomycin (VANCOCIN) 1,500 mg in sodium chloride 0.9 % 500 mL IVPB     1,500 mg 250 mL/hr over 120 Minutes Intravenous  Once 08/08/18 2009 08/09/18 0110   08/08/18 2100  ceFEPIme (MAXIPIME) 2 g in sodium chloride 0.9 % 100 mL IVPB     2 g 200 mL/hr over 30 Minutes Intravenous  Once 08/08/18 2009 08/08/18 2251  Subjective: Seen and examined at bedside states that her IV was burning where her potassium was running through.  No chest pain, lightheadedness or dizziness.  States that her leg is also sore.  Did not feel well.  Wanting to eat.  No other concerns or complaints except of the pain she is complaining about.  Objective: Vitals:   08/09/18 1205 08/09/18 1420 08/09/18 1545 08/09/18 1611  BP:  (!) 135/93  (!) 132/59  Pulse:  (!) 106  97  Resp:  20  15  Temp: 98.5 F (36.9 C)  98.5 F (36.9 C)   TempSrc: Oral  Oral   SpO2:  98%  98%  Weight:      Height:        Intake/Output Summary (Last 24 hours) at 08/09/2018 1654 Last data filed at 08/09/2018 0955 Gross per 24 hour  Intake 3373.35 ml  Output 1100 ml  Net 2273.35 ml   Filed Weights   08/08/18 2015  Weight: 72.7 kg   Examination: Physical Exam:  Constitutional: WN/WD AAF in mild distress and appears calm but uncomfortable Eyes: Lids  and conjunctivae normal, sclerae anicteric  ENMT: External Ears, Nose appear normal. Grossly normal hearing. Mucous membranes are moist now Neck: Appears normal, supple, no cervical masses, normal ROM, no appreciable thyromegaly; no JVD Respiratory: Diminished to auscultation bilaterally, no wheezing, rales, rhonchi or crackles. Normal respiratory effort and patient is not tachypenic. No accessory muscle use.  Cardiovascular: Tachycardic Rate, no murmurs / rubs / gallops. S1 and S2 auscultated. Trace extremity edema. Abdomen: Soft, tender, slightly distended. No masses palpated. No appreciable hepatosplenomegaly. Bowel sounds positive x4.  GU: Deferred. Musculoskeletal: No clubbing / cyanosis of digits/nails.  Normal strength and muscle tone.  Skin: No rashes, lesions, ulcers on a limited skin evaluation. No induration; Warm and dry.  Neurologic: CN 2-12 grossly intact with no focal deficits. Romberg sign and cerebellar reflexes not assessed.  Psychiatric: Normal judgment and insight. Alert and oriented x 3. Anxious mood and appropriate affect.   Data Reviewed: I have personally reviewed following labs and imaging studies  CBC: Recent Labs  Lab 08/08/18 1657 08/08/18 1718 08/09/18 0406  WBC  --  19.8* 25.5*  NEUTROABS  --  15.8*  --   HGB 15.3* 16.4* 13.0  HCT 45.0 53.3* 42.3  MCV  --  89.4 89.4  PLT  --  300 212   Basic Metabolic Panel: Recent Labs  Lab 08/08/18 1657 08/08/18 1718 08/08/18 1934 08/08/18 2321 08/09/18 0406  NA 139 140 148* 148* 151*  K 5.2* 5.3* 4.2 3.7 3.4*  CL 108 99 112* 117* 121*  CO2  --  16* 17* 19* 20*  GLUCOSE >700* 707* 458* 289* 178*  BUN 56* 49* 42* 39* 34*  CREATININE 1.60* 1.98* 1.62* 1.50* 1.23*  1.28*  CALCIUM  --  9.7 9.1 9.0 8.6*   GFR: Estimated Creatinine Clearance: 33.3 mL/min (A) (by C-G formula based on SCr of 1.28 mg/dL (H)). Liver Function Tests: Recent Labs  Lab 08/08/18 1718  AST 13*  ALT 13  ALKPHOS 123  BILITOT 1.2   PROT 9.1*  ALBUMIN 4.5   Recent Labs  Lab 08/08/18 1718  LIPASE 83*   No results for input(s): AMMONIA in the last 168 hours. Coagulation Profile: Recent Labs  Lab 08/08/18 1718  INR 1.01   Cardiac Enzymes: No results for input(s): CKTOTAL, CKMB, CKMBINDEX, TROPONINI in the last 168 hours. BNP (last 3 results) No results for input(s): PROBNP in the  last 8760 hours. HbA1C: Recent Labs    08/08/18 2030  HGBA1C 10.3*   CBG: Recent Labs  Lab 08/09/18 0548 08/09/18 0652 08/09/18 0806 08/09/18 1201 08/09/18 1631  GLUCAP 155* 165* 148* 304* 145*   Lipid Profile: No results for input(s): CHOL, HDL, LDLCALC, TRIG, CHOLHDL, LDLDIRECT in the last 72 hours. Thyroid Function Tests: Recent Labs    08/08/18 2030  TSH 4.202   Anemia Panel: No results for input(s): VITAMINB12, FOLATE, FERRITIN, TIBC, IRON, RETICCTPCT in the last 72 hours. Sepsis Labs: Recent Labs  Lab 08/08/18 1657 08/09/18 0223 08/09/18 0819  PROCALCITON  --   --  0.91  LATICACIDVEN 3.59* 1.7  --     Recent Results (from the past 240 hour(s))  Blood Culture (routine x 2)     Status: None (Preliminary result)   Collection Time: 08/08/18  5:18 PM  Result Value Ref Range Status   Specimen Description   Final    BLOOD LEFT ANTECUBITAL Performed at [facility], [address], [place], [place] [address]    Special Requests   Final    BOTTLES DRAWN AEROBIC AND ANAEROBIC Blood Culture adequate volume Performed at [facility], [address], [place], [place] [address]    Culture   Final    NO GROWTH < 24 HOURS Performed at [facility], [address], [place], [place] [address]    Report Status PENDING  Incomplete  MRSA PCR Screening     Status: None   Collection Time: 08/08/18  8:20 PM  Result Value Ref Range Status   MRSA by PCR NEGATIVE NEGATIVE Final    Comment:        The GeneXpert MRSA Assay (FDA approved for NASAL specimens only), is one  component of a comprehensive MRSA colonization surveillance program. It is not intended to diagnose MRSA infection nor to guide or monitor treatment for MRSA infections. Performed at [facility], [address], [place], [place] [address]     Radiology Studies: Dg Chest Port 1 View  Result Date: 08/08/2018 CLINICAL DATA:  77 y/o F; found on couch lethargic, short of breath, weakness, sick for 2 days. EXAM: PORTABLE CHEST 1 VIEW COMPARISON:  08/02/2018 chest radiograph FINDINGS: Stable heart size and mediastinal contours are within normal limits. Both lungs are clear. Mild reverse S curvature of the spine. Osteoarthrosis of the shoulder joints. IMPRESSION: No active disease. Electronically Signed   By: Mitzi Hansen M.D.   On: 08/08/2018 17:37   Scheduled Meds: . cholecalciferol  2,000 Units Oral Daily  . donepezil  5 mg Oral Daily  . enoxaparin (LOVENOX) injection  30 mg Subcutaneous Q24H  . insulin aspart  0-15 Units Subcutaneous TID WC  . insulin aspart  0-5 Units Subcutaneous QHS  . insulin glargine  10 Units Subcutaneous Daily  . insulin regular  0-10 Units Intravenous TID WC  . levothyroxine  50 mcg Oral Q0600  . mouth rinse  15 mL Mouth Rinse BID  . metoprolol succinate  25 mg Oral Daily  . montelukast  10 mg Oral QHS  . multivitamin with minerals  1 tablet Oral Daily  . oxybutynin  10 mg Oral Daily  . pantoprazole  40 mg Oral Daily  . simvastatin  20 mg Oral QPM  . umeclidinium bromide  1 puff Inhalation Daily   Continuous Infusions: . ceFEPime (MAXIPIME) IV Stopped (08/09/18 1234)  . insulin Stopped (08/09/18 1000)  . sodium chloride  LOS: 0 days   Merlene Laughter, DO [facility] PAGER is on AMION  If 7PM-7AM, please contact night-coverage [email or web address] Password [facility] 08/09/2018, 4:54 PM

## 2018-08-09 NOTE — Progress Notes (Signed)
Pharmacy Antibiotic Note  [name] is a 77 y.o. female admitted on 08/08/2018 with sepsis, unknown source.  CXR w/o active disease, cultures pending, MRSA PCR negative.  Pharmacy has been consulted for cefepime dosing, vancomycin has been d/c.  Plan:  Increase to Cefepime 1g IV q12h.  Follow up renal fxn, culture results, and clinical course.  F/u ability to de-escalate antibiotics.   Height: 5\' 2"  (157.5 cm) Weight: 160 lb 4.4 oz (72.7 kg) IBW/kg (Calculated) : 50.1  Temp (24hrs), Avg:98.4 F (36.9 C), Min:97.7 F (36.5 C), Max:99.4 F (37.4 C)  Recent Labs  Lab 08/08/18 1657 08/08/18 1718 08/08/18 1934 08/08/18 2321 08/09/18 0223 08/09/18 0406  WBC  --  19.8*  --   --   --  25.5*  CREATININE 1.60* 1.98* 1.62* 1.50*  --  1.23*  1.28*  LATICACIDVEN 3.59*  --   --   --  1.7  --     Estimated Creatinine Clearance: 33.3 mL/min (A) (by C-G formula based on SCr of 1.28 mg/dL (H)).    Allergies  Allergen Reactions  . Codeine Nausea And Vomiting and Other (See Comments)    Hallucinations   . Darvon [Propoxyphene Hcl]   . Fentanyl Other (See Comments)    Pt reports duragesic patches cause her to hallucinate.   . Oxycodone Hcl Nausea And Vomiting and Other (See Comments)    Hallucinate   . Propoxyphene N-Acetaminophen   . Valium [Diazepam]   . Valsartan    Antimicrobials this admission: 11/6 Cefepime >>  11/6 Vancomycin  >> 11/7  Dose adjustments this admission:  Microbiology results: 11/6 BCx: sent 11/6 MRSA PCR: negative 11/6 Influenza A/B: negative UCx: ordered   Thank you for allowing pharmacy to be a part of this patient's care.  13/6 PharmD, BCPS Pager [phone] 08/09/2018 10:27 AM

## 2018-08-09 NOTE — Evaluation (Signed)
Physical Therapy Evaluation Patient Details Name: [name] MRN: [id number] DOB: [birth year] Today's Date: 08/09/2018   History of Present Illness   [name] is a 77 y.o. female with medical history significant of dementia, type 2 diabetes, asthma, COPD, hypertension, presenting to the hospital via EMS for evaluation of hyperglycemia  with BS 700s and AMS-->dx with DKA  Clinical Impression  Pt admitted with above diagnosis. Pt currently with functional limitations due to the deficits listed below (see PT Problem List).  Pt with limited mobility d/t fatigue, also has c/o R calf pain with palpation/tender to touch no edema or redness noted;  Pt declined OOB to chair,only agreeable to sit EOB; may need SNF vs HHPT depending on progress as she has limited home support;  Pt will benefit from skilled PT to increase their independence and safety with mobility to allow discharge to the venue listed below.       Follow Up Recommendations SNF(vs HHPT pending progress)    Equipment Recommendations  Other (comment)(TBA)    Recommendations for Other Services       Precautions / Restrictions Precautions Precautions: Fall Restrictions Weight Bearing Restrictions: No      Mobility  Bed Mobility Overal bed mobility: Needs Assistance Bed Mobility: Supine to Sit     Supine to sit: Mod assist;+2 for physical assistance;+2 for safety/equipment;Max assist     General bed mobility comments: assist bringing trunk to upright, incr time; assist with trunk and LEs upon return to supine  Transfers                 General transfer comment: pt refused  Ambulation/Gait                Stairs            Wheelchair Mobility    Modified Rankin (Stroke Patients Only)       Balance Overall balance assessment: Needs assistance Sitting-balance support: Feet supported;No upper extremity supported;Bilateral upper extremity supported Sitting balance-Leahy Scale:  Poor Sitting balance - Comments: pt with intermittent posterior LOB in sitting with delayed to absent reactions/unable to limtied effort to self correct Postural control: Posterior lean                                   Pertinent Vitals/Pain Pain Assessment: Faces Faces Pain Scale: Hurts even more Pain Location: right calf Pain Descriptors / Indicators: Discomfort;Grimacing;Tender Pain Intervention(s): Limited activity within patient's tolerance;Monitored during session    Home Living Family/patient expects to be discharged to:: Private residence Living Arrangements: Alone Available Help at Discharge: Available PRN/intermittently Type of Home: House Home Access: Stairs to enter   Secretary/administrator of Steps: 3 Home Layout: One level        Prior Function Level of Independence: Independent         Comments: pt reported IND with amb and ADLs     Hand Dominance        Extremity/Trunk Assessment        Lower Extremity Assessment Lower Extremity Assessment: Generalized weakness       Communication   Communication: No difficulties  Cognition Arousal/Alertness: Awake/alert Behavior During Therapy: WFL for tasks assessed/performed Overall Cognitive Status: Within Functional Limits for tasks assessed  General Comments: pt with brief answers, not wanting to answer questions      General Comments      Exercises     Assessment/Plan    PT Assessment Patient needs continued PT services  PT Problem List Decreased strength;Decreased activity tolerance;Decreased mobility;Decreased knowledge of use of DME       PT Treatment Interventions DME instruction;Functional mobility training;Therapeutic activities;Therapeutic exercise;Patient/family education;Gait training    PT Goals (Current goals can be found in the Care Plan section)  Acute Rehab PT Goals Time For Goal Achievement: 08/23/18 Potential to  Achieve Goals: Good    Frequency Min 3X/week   Barriers to discharge        Co-evaluation               AM-PAC PT "6 Clicks" Daily Activity  Outcome Measure Difficulty turning over in bed (including adjusting bedclothes, sheets and blankets)?: Unable Difficulty moving from lying on back to sitting on the side of the bed? : Unable Difficulty sitting down on and standing up from a chair with arms (e.g., wheelchair, bedside commode, etc,.)?: Unable Help needed moving to and from a bed to chair (including a wheelchair)?: Total Help needed walking in hospital room?: Total Help needed climbing 3-5 steps with a railing? : Total 6 Click Score: 6    End of Session Equipment Utilized During Treatment: Gait belt Activity Tolerance: Patient limited by fatigue Patient left: in bed;with call bell/phone within reach;with bed alarm set   PT Visit Diagnosis: Difficulty in walking, not elsewhere classified (R26.2);Muscle weakness (generalized) (M62.81)    Time: 8563-1497 PT Time Calculation (min) (ACUTE ONLY): 17 min   Charges:   PT Evaluation $PT Eval Low Complexity: 1 Low         Drucilla Chalet, PT  Pager: [phone] Acute Rehab Dept [facility] ([address])): [phone]   08/09/2018    [facility] 08/09/2018, 2:57 PM

## 2018-08-09 NOTE — Progress Notes (Signed)
MD notified about most recent BMET: Gap 10, CO2 20, last 3 CBG's consistently 162. MD ordered to give lantus 10U, told to keep pt on insulin drip until breakfast, informed to page MD about subcut insulin orders at breakfast time. Lantus given @0600 .

## 2018-08-10 LAB — GLUCOSE, CAPILLARY
GLUCOSE-CAPILLARY: 334 mg/dL — AB (ref 70–99)
GLUCOSE-CAPILLARY: 231 mg/dL — AB (ref 70–99)
GLUCOSE-CAPILLARY: 328 mg/dL — AB (ref 70–99)
Glucose-Capillary: 344 mg/dL — ABNORMAL HIGH (ref 70–99)

## 2018-08-10 LAB — COMPREHENSIVE METABOLIC PANEL
ALBUMIN: 2.5 g/dL — AB (ref 3.5–5.0)
ALT: 9 U/L (ref 0–44)
AST: 11 U/L — AB (ref 15–41)
Alkaline Phosphatase: 59 U/L (ref 38–126)
Anion gap: 9 (ref 5–15)
BUN: 20 mg/dL (ref 8–23)
CHLORIDE: 108 mmol/L (ref 98–111)
CO2: 22 mmol/L (ref 22–32)
Calcium: 8.2 mg/dL — ABNORMAL LOW (ref 8.9–10.3)
Creatinine, Ser: 1.15 mg/dL — ABNORMAL HIGH (ref 0.44–1.00)
GFR calc Af Amer: 51 mL/min — ABNORMAL LOW (ref >60)
GFR, EST NON AFRICAN AMERICAN: 44 mL/min — AB (ref >60)
Glucose, Bld: 315 mg/dL — ABNORMAL HIGH (ref 70–99)
POTASSIUM: 3.6 mmol/L (ref 3.5–5.1)
Sodium: 139 mmol/L (ref 135–145)
Total Bilirubin: 1.1 mg/dL (ref 0.3–1.2)
Total Protein: 5.2 g/dL — ABNORMAL LOW (ref 6.5–8.1)

## 2018-08-10 LAB — CBC WITH DIFFERENTIAL/PLATELET
Abs Immature Granulocytes: 0.09 10*3/uL — ABNORMAL HIGH (ref 0.00–0.07)
BASOS ABS: 0 10*3/uL (ref 0.0–0.1)
BASOS PCT: 0 %
EOS ABS: 0.1 10*3/uL (ref 0.0–0.5)
Eosinophils Relative: 0 %
HCT: 35.6 % — ABNORMAL LOW (ref 36.0–46.0)
Hemoglobin: 10.9 g/dL — ABNORMAL LOW (ref 12.0–15.0)
IMMATURE GRANULOCYTES: 1 %
Lymphocytes Relative: 21 %
Lymphs Abs: 3.2 10*3/uL (ref 0.7–4.0)
MCH: 27.6 pg (ref 26.0–34.0)
MCHC: 30.6 g/dL (ref 30.0–36.0)
MCV: 90.1 fL (ref 80.0–100.0)
Monocytes Absolute: 0.5 10*3/uL (ref 0.1–1.0)
Monocytes Relative: 3 %
NRBC: 0 % (ref 0.0–0.2)
Neutro Abs: 11.8 10*3/uL — ABNORMAL HIGH (ref 1.7–7.7)
Neutrophils Relative %: 75 %
PLATELETS: 148 10*3/uL — AB (ref 150–400)
RBC: 3.95 MIL/uL (ref 3.87–5.11)
RDW: 14.7 % (ref 11.5–15.5)
WBC: 15.7 10*3/uL — AB (ref 4.0–10.5)

## 2018-08-10 LAB — URINE CULTURE: Culture: 20000 — AB

## 2018-08-10 LAB — PHOSPHORUS: PHOSPHORUS: 1.6 mg/dL — AB (ref 2.5–4.6)

## 2018-08-10 LAB — MAGNESIUM: MAGNESIUM: 1.9 mg/dL (ref 1.7–2.4)

## 2018-08-10 LAB — PROCALCITONIN: PROCALCITONIN: 0.49 ng/mL

## 2018-08-10 MED ORDER — INSULIN ASPART 100 UNIT/ML ~~LOC~~ SOLN
4.0000 [IU] | Freq: Three times a day (TID) | SUBCUTANEOUS | Status: DC
  Administered 2018-08-10 – 2018-08-11 (×4): 4 [IU] via SUBCUTANEOUS

## 2018-08-10 MED ORDER — POTASSIUM PHOSPHATES 15 MMOLE/5ML IV SOLN
20.0000 mmol | Freq: Once | INTRAVENOUS | Status: AC
  Administered 2018-08-10: 20 mmol via INTRAVENOUS
  Filled 2018-08-10: qty 6.67

## 2018-08-10 MED ORDER — INSULIN GLARGINE 100 UNIT/ML ~~LOC~~ SOLN
15.0000 [IU] | Freq: Every day | SUBCUTANEOUS | Status: DC
  Administered 2018-08-10: 15 [IU] via SUBCUTANEOUS
  Filled 2018-08-10 (×2): qty 0.15

## 2018-08-10 NOTE — Progress Notes (Signed)
PROGRESS NOTE    [name]  EGB:[id number] DOB: [birth year] DOA: 08/08/2018 PCP: Gwenyth Bender, MD  Brief Narrative:  HPI per Dr. John Giovanni on 08/08/18 HPI: [name] is a 77 y.o. female with medical history significant of dementia, type 2 diabetes, asthma, COPD, hypertension, hyperlipidemia presenting to the hospital via EMS for evaluation of hyperglycemia and AMS.  Patient was somnolent, history provided by family at bedside.  Son states that the patient had URI symptoms 2 weeks ago for which she went to the ED 6 days ago and was prescribed prednisone and inhalers.  States she initially improved but then decompensated again.  When family went to check on her today, she was found on the couch and was incoherent.  She did not recognize her family.  Son states that the patient does not take her medications correctly and thinks she might be taking old medications.  ED Course: Afebrile, tachycardic, tachypneic, not hypotensive, and satting well on room air.  Blood glucose greater than 700.  Bicarb 16 and anion gap 25.  VBG showing pH 7.31.  Lactic acid 3.59.  White count 19.8.  Flu panel pending. Lipase mildly elevated (83).  BNP 34.  Serum creatinine 1.9, was 0.8 in January 2018 per care everywhere.  I-STAT troponin negative and EKG not suggestive of ACS.  Chest x-ray showing no active disease.  Patient received 1.5 L normal saline boluses in the ED and was started on insulin infusion.  TRH paged to admit.  **Early yesterday (08/09/18) morning the patient was transitioned to long-acting insulin and was given at 6 AM this morning.  Insulin drip gtt stopped and electrolytes were replaced.  No urinalysis and urine cultures collected so one has been sent and patient has been complaining of some burning discomfort in her urine.  Likely this is a source of her infection as a cause of her DKA. Urine Cx was obtained after Abx were initiated but showed 20,000 CFU of Multiple species.  Patient's Urinary symptoms are improving.   Assessment & Plan:   Principal Problem:   DKA (diabetic ketoacidoses) (HCC) Active Problems:   Hypothyroidism   HLD (hyperlipidemia)   Asthma   GERD   Sepsis (HCC)   AKI (acute kidney injury) (HCC)   Acute metabolic encephalopathy   COPD (chronic obstructive pulmonary disease) (HCC)   HTN (hypertension)   Dementia (HCC)   Overactive bladder   Physical deconditioning  DKA, improving  -No recent A1c in the chart but one done this visit showed HbA1c of 10.3  -Metformin is her only home diabetes medication; not on insulin.   -Tachycardic and Tachypneic on arrival.  -Blood glucose greater than 700.   -Bicarb 16 and anion gap 25 on Admission.  -VBG showing pH 7.316/34.5/44.3   -Appeared dehydrated.  Patient has an elevated white count and lactic acidosis; Infectious source could be urine -Continue to Monitor in the stepdown unit -Diet Advanced to Heart Healthy/Carb Modified and Insulin infusion per DKA protocol stopped -IV fluid resuscitation. Continude on normal saline until blood glucose less than 250, then switch to D5 one half normal saline and then just to D5W; D5W no stopped as patient is eating  -Check Beta-Hydroxybutyrate level was 4.11.   -UA as below -BMP every 4 hours now stopped  -Transitioned to Long Acting Insulin 10 units and Moderate Novolog SSI AC/HS; Insulin has been adjusted and Added 4 units of NOvolog TID wm and increased Lantus to 15 units sq -CBG's ranging from 330-344 -  Consulted Diabetes Patent attorney for further evaluation and recommendations  -UDS was Negative   Sepsis from suspected UTI -Afebrile, tachycardic, tachypneic, not hypotensive, and satting well on room air.      -White count went from 19.8 -> 25.5 -> 15.7 -Chest x-ray showing no active disease. -IVF resuscitation has now stopped.  -Start broad-spectrum antibiotics with Vancomycin and Cefepime; Vancomycin has been discontinued as MRSA PCR  was Negative -Continue to Trend Lactate; LA went from 3.59 -> 1.7 -Procalcitonin 0.91 -> 0.49 -Flu panel Negative via PCR -UA showed Cloudy Appearance, Large Leukocytes, Nitrite, Few Bacteria, 21-50 RBC/HPF, and >50 WBC -Blood culture x2 showed NGTD at 2 Days -Urine Cx obtained and showed 20,000 CFU of Multiple Species and advised recollection -Continue to Monitor and follow Cx's  -Repeat CBC in AM   Acute Metabolic Encephalopathy, improved  -Likely related to DKA and sepsis.   -No gross focal neuro deficits on exam. -Management as above  Asthma and COPD -Stable.   -Not wheezing.   -Continue home Inhalers with Incruse Ellipta and PRN Albuterol 3 mL q6hprn Wheezing and SOB  Hypertension -Blood pressure currently stable. -Continue home Metoprolol Succinate 25 mg daily -Hold home calcium channel blocker at this time -Continue to Monitor BP's per protocol   Hyperlipidemia -Continue home Simvastatin 20 mg po qHS  Dementia -Continue home Donepezil 5 mg po Daily   Hypothyroidism -Continue home Levothyroxine 50 mcg po Daily  -Checked TSH and was 4.202  Overactive Badder -Continue home Oxybutynin 10 mg po Daily   GERD -Continue PPI with Pantoprazole 40 mg po Daily  Physical deconditioning/Generalized Weakness -PT to Evaluate and Treat; Recommending SNF vs. HHPT  Hypernatremia/Hyperchloremia -In the setting of IV fluid hydration with normal saline -Fluids were changed to D5W however now stopped giving her hypoglycemia -Improved; Na+ is now 139 and Chloride is 108 -Continue monitor and trend and repeat BMP later today and repeat CMP in a.m.  Hypokalemia -Patient potassium level was 3.6 -Replete with IV KPhos 20 mmol -Continue monitor and replete as necessary -Repeat CMP in a.m.  Hypophosphatemia -Patient's Phos Level was 1.6 -Replete with IV KPhos 20 mmol -Continue to Monitor and Replete as Necessary -Repeat Phos Level in AM   AKI in the setting of  dehydration from DKA and sepsis, improving  -Likely prerenal due to dehydration.  Serum creatinine 1.9, was 0.8 in January 2018 per care everywhere.   -Avoid nephrotoxic agents/contrast if possible  -Patient's BUN/creatinine on admission was 49/1.98 and after fluid hydration improved to 20/1.15 -IV fluid has been changed to D5W and patient is now eating solid foods so it is been discontinued -Continue to monitor and repeat CMP in a.m. -If not improving will obtain renal ultrasound, urine lites, and further work-up -Repeat CMP in the a.m.  DVT prophylaxis: Enoxaparin 30 mg sq q24h Code Status: FULL CODE Family Communication: No family present at bedside Disposition Plan: SNF vs. Home Health PT pending on progress; Consulted Social Work for Assistance with Placement   Consultants:   None   Procedures:  None   Antimicrobials:  Anti-infectives (From admission, onward)   Start     Dose/Rate Route Frequency Ordered Stop   08/10/18 2200  vancomycin (VANCOCIN) IVPB 750 mg/150 ml premix  Status:  Discontinued     750 mg 150 mL/hr over 60 Minutes Intravenous Every 48 hours 08/08/18 2057 08/09/18 0903   08/09/18 2100  ceFEPIme (MAXIPIME) 1 g in sodium chloride 0.9 % 100 mL IVPB  Status:  Discontinued  1 g 200 mL/hr over 30 Minutes Intravenous Every 24 hours 08/08/18 2057 08/09/18 1028   08/09/18 1030  ceFEPIme (MAXIPIME) 1 g in sodium chloride 0.9 % 100 mL IVPB     1 g 200 mL/hr over 30 Minutes Intravenous Every 12 hours 08/09/18 1028     08/08/18 2200  vancomycin (VANCOCIN) 1,500 mg in sodium chloride 0.9 % 500 mL IVPB     1,500 mg 250 mL/hr over 120 Minutes Intravenous  Once 08/08/18 2009 08/09/18 0110   08/08/18 2100  ceFEPIme (MAXIPIME) 2 g in sodium chloride 0.9 % 100 mL IVPB     2 g 200 mL/hr over 30 Minutes Intravenous  Once 08/08/18 2009 08/08/18 2251     Subjective: Seen and examined at bedside she is feeling better and states that her burning in her urine is improved.   No chest pain, lightheadedness or dizziness.  Tolerating food well now.  Feels much better than yesterday.  No other concerns of breath at this time  Objective: Vitals:   08/10/18 0800 08/10/18 1200 08/10/18 1600 08/10/18 1641  BP: 132/66 (!) 143/79  (!) 112/58  Pulse: 99 (!) 101  (!) 101  Resp: 19 16  16   Temp: 98 F (36.7 C) 98.3 F (36.8 C) 98.8 F (37.1 C)   TempSrc: Oral Oral Oral   SpO2: 99% 99%  97%  Weight:      Height:        Intake/Output Summary (Last 24 hours) at 08/10/2018 1820 Last data filed at 08/10/2018 1146 Gross per 24 hour  Intake 345.16 ml  Output -  Net 345.16 ml   Filed Weights   08/08/18 2015  Weight: 72.7 kg   Examination: Physical Exam:  Constitutional: Well-nourished, well-developed African-American female currently in no acute distress appears more comfortable and is calm and pleasant today Eyes: Lids and conjunctive are normal.  Sclera anicteric ENMT: External ears and nose appear normal.  Grossly normal hearing.  Mucous members are moist Neck: Appears supple no JVD Respiratory: Diminished to auscultation bilaterally no appreciable wheezing, rales, rhonchi.  Patient not tachypneic using accessory muscle breathe Cardiovascular: Still slightly tachycardic with regular rhythm.  No appreciable lower extremity edema today Abdomen: Soft, nontender.  Slightly distended.  Bowel sounds present all 4 quadrants GU: Deferred Musculoskeletal: No clubbing or cyanosis.  No joint deformities noted Skin: No appreciable rashes or lesions on a limited skin evaluation. Neurologic: Cranial nerves II through XII grossly intact no appreciable focal deficits. Psychiatric: Normal judgment and insight.  Patient is awake alert and oriented x3.  Not as anxious today.  Data Reviewed: I have personally reviewed following labs and imaging studies  CBC: Recent Labs  Lab 08/08/18 1657 08/08/18 1718 08/09/18 0406 08/10/18 0321  WBC  --  19.8* 25.5* 15.7*  NEUTROABS  --   15.8*  --  11.8*  HGB 15.3* 16.4* 13.0 10.9*  HCT 45.0 53.3* 42.3 35.6*  MCV  --  89.4 89.4 90.1  PLT  --  300 212 148*   Basic Metabolic Panel: Recent Labs  Lab 08/08/18 1934 08/08/18 2321 08/09/18 0406 08/09/18 2026 08/10/18 0321  NA 148* 148* 151* 139 139  K 4.2 3.7 3.4* 3.5 3.6  CL 112* 117* 121* 110 108  CO2 17* 19* 20* 21* 22  GLUCOSE 458* 289* 178* 297* 315*  BUN 42* 39* 34* 24* 20  CREATININE 1.62* 1.50* 1.23*  1.28* 1.16* 1.15*  CALCIUM 9.1 9.0 8.6* 8.3* 8.2*  MG  --   --   --   --  1.9  PHOS  --   --   --   --  1.6*   GFR: Estimated Creatinine Clearance: 37 mL/min (A) (by C-G formula based on SCr of 1.15 mg/dL (H)). Liver Function Tests: Recent Labs  Lab 08/08/18 1718 08/10/18 0321  AST 13* 11*  ALT 13 9  ALKPHOS 123 59  BILITOT 1.2 1.1  PROT 9.1* 5.2*  ALBUMIN 4.5 2.5*   Recent Labs  Lab 08/08/18 1718  LIPASE 83*   No results for input(s): AMMONIA in the last 168 hours. Coagulation Profile: Recent Labs  Lab 08/08/18 1718  INR 1.01   Cardiac Enzymes: No results for input(s): CKTOTAL, CKMB, CKMBINDEX, TROPONINI in the last 168 hours. BNP (last 3 results) No results for input(s): PROBNP in the last 8760 hours. HbA1C: Recent Labs    08/08/18 2030  HGBA1C 10.3*   CBG: Recent Labs  Lab 08/09/18 1201 08/09/18 1631 08/09/18 2146 08/10/18 0752 08/10/18 1205  GLUCAP 304* 145* 330* 334* 344*   Lipid Profile: No results for input(s): CHOL, HDL, LDLCALC, TRIG, CHOLHDL, LDLDIRECT in the last 72 hours. Thyroid Function Tests: Recent Labs    08/08/18 2030  TSH 4.202   Anemia Panel: No results for input(s): VITAMINB12, FOLATE, FERRITIN, TIBC, IRON, RETICCTPCT in the last 72 hours. Sepsis Labs: Recent Labs  Lab 08/08/18 1657 08/09/18 0223 08/09/18 0819 08/10/18 0321  PROCALCITON  --   --  0.91 0.49  LATICACIDVEN 3.59* 1.7  --   --     Recent Results (from the past 240 hour(s))  Blood Culture (routine x 2)     Status: None  (Preliminary result)   Collection Time: 08/08/18  4:28 PM  Result Value Ref Range Status   Specimen Description   Final    BLOOD LEFT HAND Performed at [facility], [address] [place], [place] [address]    Special Requests   Final    BOTTLES DRAWN AEROBIC ONLY Blood Culture adequate volume Performed at [facility], [address], [place], [place] [address]    Culture   Final    NO GROWTH 1 DAY Performed at [facility], [address], [place], [place] [address]    Report Status PENDING  Incomplete  Blood Culture (routine x 2)     Status: None (Preliminary result)   Collection Time: 08/08/18  5:18 PM  Result Value Ref Range Status   Specimen Description   Final    BLOOD LEFT ANTECUBITAL Performed at [facility], [address], [place], [place] [address]    Special Requests   Final    BOTTLES DRAWN AEROBIC AND ANAEROBIC Blood Culture adequate volume Performed at [facility], [address] [place], [place] [address]    Culture   Final    NO GROWTH 2 DAYS Performed at [facility], [address], [place], [place] [address]    Report Status PENDING  Incomplete  MRSA PCR Screening     Status: None   Collection Time: 08/08/18  8:20 PM  Result Value Ref Range Status   MRSA by PCR NEGATIVE NEGATIVE Final    Comment:        The GeneXpert MRSA Assay (FDA approved for NASAL specimens only), is one component of a comprehensive MRSA colonization surveillance program. It is not intended to diagnose MRSA infection nor to guide or monitor treatment for MRSA infections. Performed at [facility], [address] [place],  [place] [address]   Culture, Urine     Status: Abnormal   Collection Time: 08/09/18 11:44 AM  Result Value Ref Range Status   Specimen Description   Final    URINE, RANDOM Performed at [facility], [address],  [place], [place] [address]    Special Requests   Final    NONE Performed at [facility], [address] [place], [place] [address]    Culture (A)  Final    20,000 COLONIES/mL MULTIPLE SPECIES PRESENT, SUGGEST RECOLLECTION   Report Status 08/10/2018 FINAL  Final    Radiology Studies: No results found. Scheduled Meds: . cholecalciferol  2,000 Units Oral Daily  . donepezil  5 mg Oral Daily  . enoxaparin (LOVENOX) injection  30 mg Subcutaneous Q24H  . insulin aspart  0-15 Units Subcutaneous TID WC  . insulin aspart  0-5 Units Subcutaneous QHS  . insulin aspart  4 Units Subcutaneous TID WC  . insulin glargine  15 Units Subcutaneous Daily  . levothyroxine  50 mcg Oral Q0600  . mouth rinse  15 mL Mouth Rinse BID  . metoprolol succinate  25 mg Oral Daily  . montelukast  10 mg Oral QHS  . multivitamin with minerals  1 tablet Oral Daily  . oxybutynin  10 mg Oral Daily  . pantoprazole  40 mg Oral Daily  . simvastatin  20 mg Oral QPM  . umeclidinium bromide  1 puff Inhalation Daily   Continuous Infusions: . ceFEPime (MAXIPIME) IV Stopped (08/10/18 1146)  . sodium chloride      LOS: 1 day   Merlene Laughter, DO [facility] PAGER is on AMION  If 7PM-7AM, please contact night-coverage [email or web address] Password [email or web address] 08/10/2018, 6:20 PM

## 2018-08-11 ENCOUNTER — Encounter (HOSPITAL_COMMUNITY): Payer: Self-pay

## 2018-08-11 LAB — CBC WITH DIFFERENTIAL/PLATELET
Abs Immature Granulocytes: 0.08 10*3/uL — ABNORMAL HIGH (ref 0.00–0.07)
Abs Immature Granulocytes: 0.13 10*3/uL — ABNORMAL HIGH (ref 0.00–0.07)
BASOS ABS: 0 10*3/uL (ref 0.0–0.1)
BASOS ABS: 0 10*3/uL (ref 0.0–0.1)
BASOS PCT: 0 %
Basophils Relative: 0 %
EOS ABS: 0.1 10*3/uL (ref 0.0–0.5)
Eosinophils Absolute: 0.1 10*3/uL (ref 0.0–0.5)
Eosinophils Relative: 0 %
Eosinophils Relative: 1 %
HCT: 34.7 % — ABNORMAL LOW (ref 36.0–46.0)
HEMATOCRIT: 33.5 % — AB (ref 36.0–46.0)
HEMOGLOBIN: 10.7 g/dL — AB (ref 12.0–15.0)
Hemoglobin: 11.1 g/dL — ABNORMAL LOW (ref 12.0–15.0)
IMMATURE GRANULOCYTES: 1 %
IMMATURE GRANULOCYTES: 1 %
LYMPHS ABS: 4.1 10*3/uL — AB (ref 0.7–4.0)
LYMPHS PCT: 24 %
Lymphocytes Relative: 23 %
Lymphs Abs: 3.6 10*3/uL (ref 0.7–4.0)
MCH: 27 pg (ref 26.0–34.0)
MCH: 27.9 pg (ref 26.0–34.0)
MCHC: 32 g/dL (ref 30.0–36.0)
MCHC: 31.9 g/dL (ref 30.0–36.0)
MCV: 84.4 fL (ref 80.0–100.0)
MCV: 87.5 fL (ref 80.0–100.0)
Monocytes Absolute: 0.7 10*3/uL (ref 0.1–1.0)
Monocytes Absolute: 0.9 10*3/uL (ref 0.1–1.0)
Monocytes Relative: 4 %
Monocytes Relative: 5 %
NEUTROS PCT: 72 %
NEUTROS PCT: 69 %
NRBC: 0 % (ref 0.0–0.2)
NRBC: 0 % (ref 0.0–0.2)
Neutro Abs: 11.2 10*3/uL — ABNORMAL HIGH (ref 1.7–7.7)
Neutro Abs: 12.2 10*3/uL — ABNORMAL HIGH (ref 1.7–7.7)
PLATELETS: 128 10*3/uL — AB (ref 150–400)
Platelets: 137 10*3/uL — ABNORMAL LOW (ref 150–400)
RBC: 4.11 MIL/uL (ref 3.87–5.11)
RBC: 3.83 MIL/uL — AB (ref 3.87–5.11)
RDW: 14.2 % (ref 11.5–15.5)
RDW: 14.6 % (ref 11.5–15.5)
WBC: 15.7 10*3/uL — AB (ref 4.0–10.5)
WBC: 17.5 10*3/uL — AB (ref 4.0–10.5)

## 2018-08-11 LAB — COMPREHENSIVE METABOLIC PANEL
ALBUMIN: 2.4 g/dL — AB (ref 3.5–5.0)
ALT: 9 U/L (ref 0–44)
ANION GAP: 7 (ref 5–15)
AST: 10 U/L — AB (ref 15–41)
Alkaline Phosphatase: 68 U/L (ref 38–126)
BUN: 16 mg/dL (ref 8–23)
CHLORIDE: 107 mmol/L (ref 98–111)
CO2: 21 mmol/L — ABNORMAL LOW (ref 22–32)
Calcium: 7.9 mg/dL — ABNORMAL LOW (ref 8.9–10.3)
Creatinine, Ser: 1.11 mg/dL — ABNORMAL HIGH (ref 0.44–1.00)
GFR calc Af Amer: 53 mL/min — ABNORMAL LOW (ref >60)
GFR calc non Af Amer: 46 mL/min — ABNORMAL LOW (ref >60)
GLUCOSE: 290 mg/dL — AB (ref 70–99)
Potassium: 3.4 mmol/L — ABNORMAL LOW (ref 3.5–5.1)
SODIUM: 135 mmol/L (ref 135–145)
Total Bilirubin: 0.3 mg/dL (ref 0.3–1.2)
Total Protein: 5.3 g/dL — ABNORMAL LOW (ref 6.5–8.1)

## 2018-08-11 LAB — MAGNESIUM: Magnesium: 1.6 mg/dL — ABNORMAL LOW (ref 1.7–2.4)

## 2018-08-11 LAB — GLUCOSE, CAPILLARY
GLUCOSE-CAPILLARY: 273 mg/dL — AB (ref 70–99)
GLUCOSE-CAPILLARY: 229 mg/dL — AB (ref 70–99)
GLUCOSE-CAPILLARY: 220 mg/dL — AB (ref 70–99)
GLUCOSE-CAPILLARY: 187 mg/dL — AB (ref 70–99)

## 2018-08-11 LAB — PROCALCITONIN: Procalcitonin: 0.29 ng/mL

## 2018-08-11 LAB — PHOSPHORUS: Phosphorus: 2 mg/dL — ABNORMAL LOW (ref 2.5–4.6)

## 2018-08-11 MED ORDER — INSULIN GLARGINE 100 UNIT/ML ~~LOC~~ SOLN
18.0000 [IU] | Freq: Every day | SUBCUTANEOUS | Status: DC
  Administered 2018-08-11: 18 [IU] via SUBCUTANEOUS
  Filled 2018-08-11 (×3): qty 0.18

## 2018-08-11 MED ORDER — INSULIN ASPART 100 UNIT/ML ~~LOC~~ SOLN: 0.0000 [IU] | Freq: Every day | SUBCUTANEOUS | Status: DC

## 2018-08-11 MED ORDER — MAGNESIUM SULFATE 2 GM/50ML IV SOLN
2.0000 g | Freq: Once | INTRAVENOUS | Status: AC
  Administered 2018-08-11: 2 g via INTRAVENOUS
  Filled 2018-08-11: qty 50

## 2018-08-11 MED ORDER — POTASSIUM CHLORIDE CRYS ER 20 MEQ PO TBCR
40.0000 meq | EXTENDED_RELEASE_TABLET | Freq: Once | ORAL | Status: DC
  Filled 2018-08-11: qty 2

## 2018-08-11 MED ORDER — INSULIN ASPART 100 UNIT/ML ~~LOC~~ SOLN
0.0000 [IU] | Freq: Three times a day (TID) | SUBCUTANEOUS | Status: DC
  Administered 2018-08-11 (×2): 7 [IU] via SUBCUTANEOUS
  Administered 2018-08-12 (×2): 15 [IU] via SUBCUTANEOUS
  Administered 2018-08-13 (×2): 3 [IU] via SUBCUTANEOUS
  Administered 2018-08-14: 7 [IU] via SUBCUTANEOUS
  Administered 2018-08-14: 3 [IU] via SUBCUTANEOUS
  Administered 2018-08-15: 4 [IU] via SUBCUTANEOUS
  Administered 2018-08-15: 3 [IU] via SUBCUTANEOUS

## 2018-08-11 MED ORDER — POTASSIUM PHOSPHATES 15 MMOLE/5ML IV SOLN
20.0000 mmol | Freq: Once | INTRAVENOUS | Status: AC
  Administered 2018-08-11: 20 mmol via INTRAVENOUS
  Filled 2018-08-11: qty 6.67

## 2018-08-11 NOTE — Progress Notes (Signed)
Patient transferred from ICU, patient A&O x4. Agree with prior assessment. Will continue to monitor patient closely.

## 2018-08-11 NOTE — Plan of Care (Signed)
°  Problem: Nutrition: °Goal: Adequate nutrition will be maintained °Outcome: Progressing °  °Problem: Activity: °Goal: Risk for activity intolerance will decrease °Outcome: Progressing °  °Problem: Elimination: °Goal: Will not experience complications related to bowel motility °Outcome: Progressing °  °

## 2018-08-11 NOTE — Progress Notes (Addendum)
PROGRESS NOTE    [name]  RVU:[id number] DOB: Apr 09, 1941 DOA: 08/08/2018 PCP: Gwenyth Bender, MD  Brief Narrative:  HPI per Dr. John Giovanni on 08/08/18 HPI: [name] is a 77 y.o. female with medical history significant of dementia, type 2 diabetes, asthma, COPD, hypertension, hyperlipidemia presenting to the hospital via EMS for evaluation of hyperglycemia and AMS.  Patient was somnolent, history provided by family at bedside.  Son states that the patient had URI symptoms 2 weeks ago for which she went to the ED 6 days ago and was prescribed prednisone and inhalers.  States she initially improved but then decompensated again.  When family went to check on her today, she was found on the couch and was incoherent.  She did not recognize her family.  Son states that the patient does not take her medications correctly and thinks she might be taking old medications.  ED Course: Afebrile, tachycardic, tachypneic, not hypotensive, and satting well on room air.  Blood glucose greater than 700.  Bicarb 16 and anion gap 25.  VBG showing pH 7.31.  Lactic acid 3.59.  White count 19.8.  Flu panel pending. Lipase mildly elevated (83).  BNP 34.  Serum creatinine 1.9, was 0.8 in January 2018 per care everywhere.  I-STAT troponin negative and EKG not suggestive of ACS.  Chest x-ray showing no active disease.  Patient received 1.5 L normal saline boluses in the ED and was started on insulin infusion.  TRH paged to admit.  **Early yesterday (08/09/18) morning the patient was transitioned to long-acting insulin and was given at 6 AM this morning.  Insulin drip gtt stopped and electrolytes were replaced.  No urinalysis and urine cultures collected so one has been sent and patient has been complaining of some burning discomfort in her urine.  Likely this is a source of her infection as a cause of her DKA. Urine Cx was obtained after Abx were initiated but showed 20,000 CFU of Multiple species.  Patient's Urinary symptoms are improving but blood sugars still remain uncontrolled.    Assessment & Plan:   Principal Problem:   DKA (diabetic ketoacidoses) (HCC) Active Problems:   Hypothyroidism   HLD (hyperlipidemia)   Asthma   GERD   Sepsis (HCC)   AKI (acute kidney injury) (HCC)   Acute metabolic encephalopathy   COPD (chronic obstructive pulmonary disease) (HCC)   HTN (hypertension)   Dementia (HCC)   Overactive bladder   Physical deconditioning  DKA, improving  -No recent A1c in the chart but one done this visit showed HbA1c of 10.3  -Metformin is her only home diabetes medication; not on insulin.   -Tachycardic and Tachypneic on arrival.  -Blood glucose greater than 700.   -Bicarb 16 and anion gap 25 on Admission.  -VBG showing pH 7.316/34.5/44.3   -Appeared dehydrated.  Patient has an elevated white count and lactic acidosis; Infectious source could be urine -Stable to transfer out of SDU to General Medical Floor with Telemetry  -Diet Advanced to Heart Healthy/Carb Modified and Insulin infusion per DKA protocol stopped -IV fluid resuscitation. Continude on normal saline until blood glucose less than 250, then switch to D5 one half normal saline and then just to D5W; D5W no stopped as patient is eating  -Check Beta-Hydroxybutyrate level was 4.11.   -UA as below -BMP every 4 hours now stopped  -Transitioned to Long Acting Insulin 10 units and Moderate Novolog SSI AC/HS;  -Insulin has been adjusted and Added 4 units of  NOvolog TID wm and increased Lantus to 15 units sq; Lantus further increased to 18 units and Moderate Novolog SSI AC/HS has been increased to Resistant Novolog SSI AC/HS -CBG's ranging from 229-328 -Consulted Diabetes Education Coordinator for further evaluation and recommendations  -UDS was Negative   Sepsis from suspected UTI -Afebrile, tachycardic, tachypneic, not hypotensive, and satting well on room air.      -White count went from 19.8 -> 25.5 ->  15.7 -> 15.7 -Chest x-ray showing no active disease. -IVF resuscitation has now stopped.  -Started broad-spectrum antibiotics with Vancomycin and Cefepime; Vancomycin has been discontinued as MRSA PCR was Negative; Will continue IV Cefepime for now  -Continue to Trend Lactate; LA went from 3.59 -> 1.7 -Procalcitonin 0.91 -> 0.49 -> 0.29 -Flu panel Negative via PCR -UA showed Cloudy Appearance, Large Leukocytes, Nitrite, Few Bacteria, 21-50 RBC/HPF, and >50 WBC -Blood culture x2 showed NGTD at 2 Days -Urine Cx obtained and showed 20,000 CFU of Multiple Species and advised recollection which was sent again today   -Continue to Monitor and follow Cx's  -Repeat CBC in AM   Acute Metabolic Encephalopathy, improved  -Likely related to DKA and sepsis.   -No gross focal neuro deficits on exam. -Management as above -Much Improved.   Asthma and COPD -Stable.   -Not wheezing.   -Continue home Inhalers with Incruse Ellipta and PRN Albuterol 3 mL q6hprn Wheezing and SOB  Hypertension -Blood pressure currently stable and was 132/86 -Continue home Metoprolol Succinate 25 mg daily -Hold home calcium channel blocker at this time -Continue to Monitor BP's per protocol   Hyperlipidemia -Continue home Simvastatin 20 mg po qHS  Dementia -Continue home Donepezil 5 mg po Daily   Hypothyroidism -Continue home Levothyroxine 50 mcg po Daily  -Checked TSH and was 4.202  Overactive Badder -Continue home Oxybutynin 10 mg po Daily   GERD -Continue PPI with Pantoprazole 40 mg po Daily  Physical deconditioning/Generalized Weakness -PT to Evaluate and Treat; Recommending SNF vs. HHPT  Hypernatremia/Hyperchloremia -In the setting of IV fluid hydration with normal saline -Fluids were changed to D5W however now stopped giving her hypoglycemia -Improved; Na+ is now 135 and Chloride is 107 -Continue monitor and trend and repeat BMP later today and repeat CMP in a.m.  Hypokalemia -Patient  potassium level was 3.4 -Replete with po Potassium Chloride 40 mEQ x1 and IV K-Phos 20 mmol  -Continue monitor and replete as necessary -Repeat CMP in a.m.  Hypophosphatemia -Patient's Phos Level was 2.0 -Replete with IV KPhos 20 mmol -Continue to Monitor and Replete as Necessary -Repeat Phos Level in AM   Hypomagnesemia -Patient's Mag Level was 1.6 -Replete with IV Mag Sulfate 2 grams -Continue to Monitor and Replete as Necessary -Repeat Mag Level in the AM   AKI in the setting of dehydration from DKA and sepsis, improving  -Likely prerenal due to dehydration.  Serum creatinine 1.9, was 0.8 in January 2018 per care everywhere.   -Avoid nephrotoxic agents/contrast if possible  -Patient's BUN/creatinine on admission was 49/1.98 and after fluid hydration improved to 16/1.11 -IV fluid has been changed to D5W and patient is now eating solid foods so it is been discontinued -Continue to monitor and repeat CMP in a.m. -If not improving will obtain renal ultrasound, urine lites, and further work-up -Repeat CMP in the a.m.  DVT prophylaxis: Enoxaparin 30 mg sq q24h Code Status: FULL CODE Family Communication: No family present at bedside Disposition Plan: SNF vs. Home Health PT pending on progress; Consulted Social  Work for Assistance with Placement in case needs SNF. Stable to transfer out of SDU today to GMF with Telemetry  Consultants:   None   Procedures:  None   Antimicrobials:  Anti-infectives (From admission, onward)   Start     Dose/Rate Route Frequency Ordered Stop   08/10/18 2200  vancomycin (VANCOCIN) IVPB 750 mg/150 ml premix  Status:  Discontinued     750 mg 150 mL/hr over 60 Minutes Intravenous Every 48 hours 08/08/18 2057 08/09/18 0903   08/09/18 2100  ceFEPIme (MAXIPIME) 1 g in sodium chloride 0.9 % 100 mL IVPB  Status:  Discontinued     1 g 200 mL/hr over 30 Minutes Intravenous Every 24 hours 08/08/18 2057 08/09/18 1028   08/09/18 1030  ceFEPIme (MAXIPIME) 1  g in sodium chloride 0.9 % 100 mL IVPB     1 g 200 mL/hr over 30 Minutes Intravenous Every 12 hours 08/09/18 1028     08/08/18 2200  vancomycin (VANCOCIN) 1,500 mg in sodium chloride 0.9 % 500 mL IVPB     1,500 mg 250 mL/hr over 120 Minutes Intravenous  Once 08/08/18 2009 08/09/18 0110   08/08/18 2100  ceFEPIme (MAXIPIME) 2 g in sodium chloride 0.9 % 100 mL IVPB     2 g 200 mL/hr over 30 Minutes Intravenous  Once 08/08/18 2009 08/08/18 2251     Subjective: Seen and examined at bedside she still having some urinary discomfort but not as bad.  States she is doing fair.  No chest pain, lightheadedness or dizziness.  Tolerating food without issues.  No other concerns or complaints at this time  Objective: Vitals:   08/11/18 0859 08/11/18 1000 08/11/18 1047 08/11/18 1225  BP:  137/60  132/86  Pulse:  98  98  Resp:  14  16  Temp: 98 F (36.7 C)   98 F (36.7 C)  TempSrc: Oral   Oral  SpO2:  100% 100% 99%  Weight:      Height:        Intake/Output Summary (Last 24 hours) at 08/11/2018 1533 Last data filed at 08/11/2018 1042 Gross per 24 hour  Intake 488.71 ml  Output 600 ml  Net -111.29 ml   Filed Weights   08/08/18 2015  Weight: 72.7 kg   Examination: Physical Exam:  Constitutional: Well-nourished, well-developed African-American female currently no acute distress appears calm and comfortable today in breakfast Eyes: Lids and conjunctive are normal.  Sclera anicteric ENMT: External ears and nose appear normal.  Mucous membranes appear moist Neck: Appears supple with no JVD Respiratory: Diminished to auscultation bilaterally no appreciable wheezing, rales, rhonchi.  Patient is a dry cough.  Not tachypneic and is not using accessory muscles to breathe Cardiovascular: Slightly tachycardic but regular rhythm.  No appreciable lower extremity edema Abdomen: Soft, nontender, slightly distended.  Bowel sounds present x4 GU: Deferred Musculoskeletal: No contractures or cyanosis.  No  joint deformity noted Skin: Skin is warm and dry no appreciable rashes or lesions limited skin evaluation Neurologic: Cranial nerves II through XII grossly intact no appreciable focal deficit Psychiatric: Normal mood and affect.  Intact judgment and insight.  Patient is awake and alert and oriented x3  Data Reviewed: I have personally reviewed following labs and imaging studies  CBC: Recent Labs  Lab 08/08/18 1657 08/08/18 1718 08/09/18 0406 08/10/18 0321 08/11/18 0321  WBC  --  19.8* 25.5* 15.7* 15.7*  NEUTROABS  --  15.8*  --  11.8* 11.2*  HGB 15.3* 16.4* 13.0  10.9* 11.1*  HCT 45.0 53.3* 42.3 35.6* 34.7*  MCV  --  89.4 89.4 90.1 84.4  PLT  --  300 212 148* 128*   Basic Metabolic Panel: Recent Labs  Lab 08/08/18 2321 08/09/18 0406 08/09/18 2026 08/10/18 0321 08/11/18 0321  NA 148* 151* 139 139 135  K 3.7 3.4* 3.5 3.6 3.4*  CL 117* 121* 110 108 107  CO2 19* 20* 21* 22 21*  GLUCOSE 289* 178* 297* 315* 290*  BUN 39* 34* 24* 20 16  CREATININE 1.50* 1.23*  1.28* 1.16* 1.15* 1.11*  CALCIUM 9.0 8.6* 8.3* 8.2* 7.9*  MG  --   --   --  1.9 1.6*  PHOS  --   --   --  1.6* 2.0*   GFR: Estimated Creatinine Clearance: 38.3 mL/min (A) (by C-G formula based on SCr of 1.11 mg/dL (H)). Liver Function Tests: Recent Labs  Lab 08/08/18 1718 08/10/18 0321 08/11/18 0321  AST 13* 11* 10*  ALT 13 9 9   ALKPHOS 123 59 68  BILITOT 1.2 1.1 0.3  PROT 9.1* 5.2* 5.3*  ALBUMIN 4.5 2.5* 2.4*   Recent Labs  Lab 08/08/18 1718  LIPASE 83*   No results for input(s): AMMONIA in the last 168 hours. Coagulation Profile: Recent Labs  Lab 08/08/18 1718  INR 1.01   Cardiac Enzymes: No results for input(s): CKTOTAL, CKMB, CKMBINDEX, TROPONINI in the last 168 hours. BNP (last 3 results) No results for input(s): PROBNP in the last 8760 hours. HbA1C: Recent Labs    08/08/18 2030  HGBA1C 10.3*   CBG: Recent Labs  Lab 08/10/18 1205 08/10/18 1659 08/10/18 2246 08/11/18 0817  08/11/18 1201  GLUCAP 344* 231* 328* 273* 229*   Lipid Profile: No results for input(s): CHOL, HDL, LDLCALC, TRIG, CHOLHDL, LDLDIRECT in the last 72 hours. Thyroid Function Tests: Recent Labs    08/08/18 2030  TSH 4.202   Anemia Panel: No results for input(s): VITAMINB12, FOLATE, FERRITIN, TIBC, IRON, RETICCTPCT in the last 72 hours. Sepsis Labs: Recent Labs  Lab 08/08/18 1657 08/09/18 0223 08/09/18 0819 08/10/18 0321 08/11/18 0601  PROCALCITON  --   --  0.91 0.49 0.29  LATICACIDVEN 3.59* 1.7  --   --   --     Recent Results (from the past 240 hour(s))  Blood Culture (routine x 2)     Status: None (Preliminary result)   Collection Time: 08/08/18  4:28 PM  Result Value Ref Range Status   Specimen Description   Final    BLOOD LEFT HAND Performed at [facility], [address] [place], [place] [address]    Special Requests   Final    BOTTLES DRAWN AEROBIC ONLY Blood Culture adequate volume Performed at [facility], [address] [place], [place] [address]    Culture   Final    NO GROWTH 1 DAY Performed at [facility], [address], [place], [place] [address]    Report Status PENDING  Incomplete  Blood Culture (routine x 2)     Status: None (Preliminary result)   Collection Time: 08/08/18  5:18 PM  Result Value Ref Range Status   Specimen Description   Final    BLOOD LEFT ANTECUBITAL Performed at [facility], [address] [place], [place] [address]    Special Requests   Final    BOTTLES DRAWN AEROBIC AND ANAEROBIC Blood Culture adequate volume Performed at [facility], [address] [place],  [place] [address]    Culture   Final    NO GROWTH 2 DAYS Performed at [facility], [address], [place], [place] [address]    Report Status PENDING  Incomplete  MRSA PCR Screening     Status: None   Collection Time: 08/08/18  8:20 PM  Result Value Ref Range Status    MRSA by PCR NEGATIVE NEGATIVE Final    Comment:        The GeneXpert MRSA Assay (FDA approved for NASAL specimens only), is one component of a comprehensive MRSA colonization surveillance program. It is not intended to diagnose MRSA infection nor to guide or monitor treatment for MRSA infections. Performed at [facility], [address] [place], [place] [address]   Culture, Urine     Status: Abnormal   Collection Time: 08/09/18 11:44 AM  Result Value Ref Range Status   Specimen Description   Final    URINE, RANDOM Performed at [facility], [address] [place], [place] [address]    Special Requests   Final    NONE Performed at [facility], [address] [place], [place] [address]    Culture (A)  Final    20,000 COLONIES/mL MULTIPLE SPECIES PRESENT, SUGGEST RECOLLECTION   Report Status 08/10/2018 FINAL  Final    Radiology Studies: No results found. Scheduled Meds: . cholecalciferol  2,000 Units Oral Daily  . donepezil  5 mg Oral Daily  . enoxaparin (LOVENOX) injection  30 mg Subcutaneous Q24H  . insulin aspart  0-20 Units Subcutaneous TID WC  . insulin aspart  0-5 Units Subcutaneous QHS  . insulin aspart  4 Units Subcutaneous TID WC  . insulin glargine  18 Units Subcutaneous Daily  . levothyroxine  50 mcg Oral Q0600  . mouth rinse  15 mL Mouth Rinse BID  . metoprolol succinate  25 mg Oral Daily  . montelukast  10 mg Oral QHS  . multivitamin with minerals  1 tablet Oral Daily  . oxybutynin  10 mg Oral Daily  . pantoprazole  40 mg Oral Daily  . potassium chloride  40 mEq Oral Once  . simvastatin  20 mg Oral QPM  . umeclidinium bromide  1 puff Inhalation Daily   Continuous Infusions: . ceFEPime (MAXIPIME) IV Stopped (08/11/18 1029)  . potassium PHOSPHATE IVPB (in mmol) 20 mmol (08/11/18 1042)  . sodium chloride      LOS: 2 days   Merlene Laughter, DO [facility] PAGER is on AMION  If  7PM-7AM, please contact night-coverage [email or web address] Password [facility] 08/11/2018, 3:33 PM

## 2018-08-12 LAB — CBC WITH DIFFERENTIAL/PLATELET
Abs Immature Granulocytes: 0.09 10*3/uL — ABNORMAL HIGH (ref 0.00–0.07)
BASOS PCT: 0 %
Basophils Absolute: 0 10*3/uL (ref 0.0–0.1)
EOS ABS: 0.1 10*3/uL (ref 0.0–0.5)
Eosinophils Relative: 1 %
HCT: 32.6 % — ABNORMAL LOW (ref 36.0–46.0)
Hemoglobin: 10.3 g/dL — ABNORMAL LOW (ref 12.0–15.0)
Immature Granulocytes: 1 %
Lymphocytes Relative: 23 %
Lymphs Abs: 3.2 10*3/uL (ref 0.7–4.0)
MCH: 26.9 pg (ref 26.0–34.0)
MCHC: 31.6 g/dL (ref 30.0–36.0)
MCV: 85.1 fL (ref 80.0–100.0)
MONOS PCT: 4 %
Monocytes Absolute: 0.6 10*3/uL (ref 0.1–1.0)
NEUTROS ABS: 10 10*3/uL — AB (ref 1.7–7.7)
NRBC: 0 % (ref 0.0–0.2)
Neutrophils Relative %: 71 %
PLATELETS: 131 10*3/uL — AB (ref 150–400)
RBC: 3.83 MIL/uL — AB (ref 3.87–5.11)
RDW: 14.5 % (ref 11.5–15.5)
WBC: 14 10*3/uL — AB (ref 4.0–10.5)

## 2018-08-12 LAB — GLUCOSE, CAPILLARY
GLUCOSE-CAPILLARY: 327 mg/dL — AB (ref 70–99)
GLUCOSE-CAPILLARY: 101 mg/dL — AB (ref 70–99)
Glucose-Capillary: 317 mg/dL — ABNORMAL HIGH (ref 70–99)
Glucose-Capillary: 124 mg/dL — ABNORMAL HIGH (ref 70–99)

## 2018-08-12 LAB — COMPREHENSIVE METABOLIC PANEL
ALT: 11 U/L (ref 0–44)
AST: 13 U/L — AB (ref 15–41)
Albumin: 2.4 g/dL — ABNORMAL LOW (ref 3.5–5.0)
Alkaline Phosphatase: 77 U/L (ref 38–126)
Anion gap: 8 (ref 5–15)
BUN: 17 mg/dL (ref 8–23)
CHLORIDE: 106 mmol/L (ref 98–111)
CO2: 20 mmol/L — AB (ref 22–32)
Calcium: 7.8 mg/dL — ABNORMAL LOW (ref 8.9–10.3)
Creatinine, Ser: 0.91 mg/dL (ref 0.44–1.00)
GFR, EST NON AFRICAN AMERICAN: 58 mL/min — AB (ref >60)
Glucose, Bld: 378 mg/dL — ABNORMAL HIGH (ref 70–99)
POTASSIUM: 3.6 mmol/L (ref 3.5–5.1)
SODIUM: 134 mmol/L — AB (ref 135–145)
Total Bilirubin: 0.5 mg/dL (ref 0.3–1.2)
Total Protein: 5 g/dL — ABNORMAL LOW (ref 6.5–8.1)

## 2018-08-12 LAB — PHOSPHORUS: PHOSPHORUS: 2.3 mg/dL — AB (ref 2.5–4.6)

## 2018-08-12 LAB — MAGNESIUM: Magnesium: 1.9 mg/dL (ref 1.7–2.4)

## 2018-08-12 MED ORDER — INSULIN GLARGINE 100 UNIT/ML ~~LOC~~ SOLN
22.0000 [IU] | Freq: Every day | SUBCUTANEOUS | Status: DC
  Administered 2018-08-12 – 2018-08-16 (×5): 22 [IU] via SUBCUTANEOUS
  Filled 2018-08-12 (×5): qty 0.22

## 2018-08-12 MED ORDER — STERILE WATER FOR INJECTION IV SOLN
INTRAVENOUS | Status: AC
  Administered 2018-08-12: 09:00:00 via INTRAVENOUS
  Filled 2018-08-12: qty 850

## 2018-08-12 MED ORDER — INSULIN ASPART 100 UNIT/ML ~~LOC~~ SOLN
6.0000 [IU] | Freq: Three times a day (TID) | SUBCUTANEOUS | Status: DC
  Administered 2018-08-12 – 2018-08-16 (×13): 6 [IU] via SUBCUTANEOUS

## 2018-08-12 MED ORDER — ENOXAPARIN SODIUM 40 MG/0.4ML ~~LOC~~ SOLN
40.0000 mg | SUBCUTANEOUS | Status: DC
  Administered 2018-08-12 – 2018-08-15 (×4): 40 mg via SUBCUTANEOUS
  Filled 2018-08-12 (×4): qty 0.4

## 2018-08-12 MED ORDER — K PHOS MONO-SOD PHOS DI & MONO 155-852-130 MG PO TABS
500.0000 mg | ORAL_TABLET | Freq: Two times a day (BID) | ORAL | Status: AC
  Administered 2018-08-12 (×2): 500 mg via ORAL
  Filled 2018-08-12 (×2): qty 2

## 2018-08-12 NOTE — Progress Notes (Signed)
PROGRESS NOTE    [name]  UXL:[id number] DOB: 03-26-1941 DOA: 08/08/2018 PCP: Gwenyth Bender, MD  Brief Narrative:  HPI per Dr. John Giovanni on 08/08/18 HPI: [name] is a 77 y.o. female with medical history significant of dementia, type 2 diabetes, asthma, COPD, hypertension, hyperlipidemia presenting to the hospital via EMS for evaluation of hyperglycemia and AMS.  Patient was somnolent, history provided by family at bedside.  Son states that the patient had URI symptoms 2 weeks ago for which she went to the ED 6 days ago and was prescribed prednisone and inhalers.  States she initially improved but then decompensated again.  When family went to check on her today, she was found on the couch and was incoherent.  She did not recognize her family.  Son states that the patient does not take her medications correctly and thinks she might be taking old medications.  ED Course: Afebrile, tachycardic, tachypneic, not hypotensive, and satting well on room air.  Blood glucose greater than 700.  Bicarb 16 and anion gap 25.  VBG showing pH 7.31.  Lactic acid 3.59.  White count 19.8.  Flu panel pending. Lipase mildly elevated (83).  BNP 34.  Serum creatinine 1.9, was 0.8 in January 2018 per care everywhere.  I-STAT troponin negative and EKG not suggestive of ACS.  Chest x-ray showing no active disease.  Patient received 1.5 L normal saline boluses in the ED and was started on insulin infusion.  TRH paged to admit.  **Early yesterday (08/09/18) morning the patient was transitioned to long-acting insulin and was given at 6 AM this morning.  Insulin drip gtt stopped and electrolytes were replaced.  No urinalysis and urine cultures collected so one has been sent and patient has been complaining of some burning discomfort in her urine.  Likely this is a source of her infection as a cause of her DKA. Urine Cx was obtained after Abx were initiated but showed 20,000 CFU of Multiple species.  Patient's Urinary symptoms are improving but blood sugars still remain uncontrolled and insulin is further being adjusted.  Assessment & Plan:   Principal Problem:   DKA (diabetic ketoacidoses) (HCC) Active Problems:   Hypothyroidism   HLD (hyperlipidemia)   Asthma   GERD   Sepsis (HCC)   AKI (acute kidney injury) (HCC)   Acute metabolic encephalopathy   COPD (chronic obstructive pulmonary disease) (HCC)   HTN (hypertension)   Dementia (HCC)   Overactive bladder   Physical deconditioning  DKA, improved  Uncontrolled Hyperglycemia in the Setting of Diabetes Mellitus Type 2 -No recent A1c in the chart but one done this visit showed HbA1c of 10.3  -Metformin is her only home diabetes medication; not on insulin normally .   -Tachycardic and Tachypneic on arrival.  -Blood glucose was greater than 700.   -Bicarb 16 and anion gap 25 on Admission.  -VBG showing pH 7.316/34.5/44.3   -Appeared dehydrated.  Patient had an elevated white count and lactic acidosis; Infectious source could be urine -She improved and was stable to transfer out of SDU to General Medical Floor with Telemetry  -Started on Bicarbonate gtt again given her worsening CO2 as it trended down from 22 -> 20 -Diet Advanced to Heart Healthy/Carb Modified and Insulin infusion per DKA protocol stopped -IV fluid resuscitation. Continude on normal saline until blood glucose less than 250, then switch to D5 one half normal saline and then just to D5W; D5W no stopped as patient is eating  -Check  Beta-Hydroxybutyrate level was 4.11.   -UA as below -BMP every 4 hours now stopped  -Transitioned to Long Acting Insulin 10 units and Moderate Novolog SSI AC/HS;  -Insulin adjusted and is now on 2 units of Lantus subcu daily, resistant NovoLog sliding scale insulin before meals and at bedtime, and 6 units of NovoLog 3 times daily with meals -CBG's ranging from 187-327 -Consulted Diabetes Education Coordinator for further evaluation and  recommendations  -UDS was Negative  -Continue to adjust insulin as necessary and will need diabetic education teaching likely be discharged on home insulin given her uncontrolled hyperglycemia when medically stable   Sepsis from suspected UTI -Afebrile, tachycardic, tachypneic, not hypotensive, and satting well on room air.      -White count went from 19.8 -> 25.5 -> 15.7 -> 15.7 -> 14.0 -Chest x-ray showing no active disease. -IVF resuscitation restarted again with Sodium Bicarbonate gtt in Sterile Water at 50 mL/hr x 10 hours -Started broad-spectrum antibiotics with Vancomycin and Cefepime; Vancomycin has been discontinued as MRSA PCR was Negative; Will continue IV Cefepime for now given that Urinalysis showed Multiple Species Present -Continue to Trend Lactate; LA went from 3.59 -> 1.7 -Procalcitonin 0.91 -> 0.49 -> 0.29 -Flu panel Negative via PCR -UA showed Cloudy Appearance, Large Leukocytes, Nitrite, Few Bacteria, 21-50 RBC/HPF, and >50 WBC -Blood culture x2 showed NGTD at 4 Days -Urine Cx obtained and showed 20,000 CFU of Multiple Species and advised recollection which was sent again yesterday and is still pending  -Continue to Monitor and follow Cx's  -Repeat CBC in AM   Acute Metabolic Encephalopathy, improved  -Likely related to DKA and sepsis.   -No gross focal neuro deficits on exam. -Management as above -Much Improved.   Asthma and COPD -Stable.   -Not wheezing.   -Continue home Inhalers with Incruse Ellipta and PRN Albuterol 3 mL q6hprn Wheezing and SOB  Hypertension -Blood pressure currently stable and was 119/65 -Continue home Metoprolol Succinate 25 mg daily -Hold home calcium channel blocker at this time -Continue to Monitor BP's per protocol   Hyperlipidemia -Continue home Simvastatin 20 mg po qHS  Dementia -Continue home Donepezil 5 mg po Daily   Hypothyroidism -Continue home Levothyroxine 50 mcg po Daily  -Checked TSH and was  4.202  Overactive Badder -Continue home Oxybutynin 10 mg po Daily   GERD -Continue PPI with Pantoprazole 40 mg po Daily  Physical deconditioning/Generalized Weakness -PT to Evaluate and Treat; Recommending SNF vs. HHPT; PT to re-evaluate  Hypernatremia/Hyperchloremia  -In the setting of IV fluid hydration with normal saline -Fluids were changed to D5W however now stopped giving her hyperglycemia -Improved; Na+ is now 134 and Chloride is 106 -Started Sodium Bicarbonate gtt 150 mEQ Daily -Continue monitor and trend and repeat BMP later today and repeat CMP in a.m.  Hypokalemia -Patient potassium level was 3.6 -Replete with po K Phos Neutral 500 mg  -Continue monitor and replete as necessary -Repeat CMP in a.m.  Hypophosphatemia -Patient's Phos Level was 2.3 -Replete with po K Phos Neutral 500 mg po BID -Continue to Monitor and Replete as Necessary -Repeat Phos Level in AM   Hypomagnesemia -Patient's Mag Level was 1.9 -Continue to Monitor and Replete as Necessary -Repeat Mag Level in the AM   AKI in the setting of dehydration from DKA and sepsis, improving  -Likely prerenal due to dehydration.  Serum creatinine 1.9, was 0.8 in January 2018 per care everywhere.   -Avoid nephrotoxic agents/contrast if possible  -Patient's BUN/creatinine on admission was  49/1.98 and after fluid hydration improved to 17/0.91 -IV fluid adjusted and changed to Sodium Bicarbonate 150 mEQ at 50 mL/hr for 10 hours -Continue to monitor and repeat CMP in a.m. -Repeat CMP in the a.m.  DVT prophylaxis: Enoxaparin 30 mg sq q24h Code Status: FULL CODE Family Communication: No family present at bedside Disposition Plan: SNF vs. Home Health PT pending on progress; Consulted Social Work for Assistance with Placement in case needs SNF. Stable to transfer out of SDU today to GMF with Telemetry  Consultants:   None   Procedures:  None   Antimicrobials:  Anti-infectives (From admission, onward)    Start     Dose/Rate Route Frequency Ordered Stop   08/10/18 2200  vancomycin (VANCOCIN) IVPB 750 mg/150 ml premix  Status:  Discontinued     750 mg 150 mL/hr over 60 Minutes Intravenous Every 48 hours 08/08/18 2057 08/09/18 0903   08/09/18 2100  ceFEPIme (MAXIPIME) 1 g in sodium chloride 0.9 % 100 mL IVPB  Status:  Discontinued     1 g 200 mL/hr over 30 Minutes Intravenous Every 24 hours 08/08/18 2057 08/09/18 1028   08/09/18 1030  ceFEPIme (MAXIPIME) 1 g in sodium chloride 0.9 % 100 mL IVPB     1 g 200 mL/hr over 30 Minutes Intravenous Every 12 hours 08/09/18 1028     08/08/18 2200  vancomycin (VANCOCIN) 1,500 mg in sodium chloride 0.9 % 500 mL IVPB     1,500 mg 250 mL/hr over 120 Minutes Intravenous  Once 08/08/18 2009 08/09/18 0110   08/08/18 2100  ceFEPIme (MAXIPIME) 2 g in sodium chloride 0.9 % 100 mL IVPB     2 g 200 mL/hr over 30 Minutes Intravenous  Once 08/08/18 2009 08/08/18 2251     Subjective: Seen and examined at bedside and states that her urinary symptoms have improved significantly.  No chest pain, lightheadedness or dizziness.  Still shocks that her blood sugars very uncontrolled.  No chest pain, lightheadedness or dizziness.  No other concerns or complaints at this time.  Objective: Vitals:   08/11/18 1047 08/11/18 1225 08/11/18 2130 08/12/18 0617  BP:  132/86 128/71 119/65  Pulse:  98 (!) 104 94  Resp:  16 (!) 24 20  Temp:  98 F (36.7 C) 98.8 F (37.1 C) 98.3 F (36.8 C)  TempSrc:  Oral Oral Oral  SpO2: 100% 99% 92% 96%  Weight:      Height:        Intake/Output Summary (Last 24 hours) at 08/12/2018 1254 Last data filed at 08/12/2018 0600 Gross per 24 hour  Intake 1125.41 ml  Output 425 ml  Net 700.41 ml   Filed Weights   08/08/18 2015  Weight: 72.7 kg   Examination: Physical Exam:  Constitutional: Patient is a well-nourished, well-developed African-American female currently no acute distress appears calm and comfortable laying in bed Eyes: Lids  and conjunctive are normal.  Sclera anicteric ENMT: External ears and nose appear normal.  Mucous members are moist Neck: Appears supple no JVD Respiratory: Diminished to auscultation bilaterally no appreciable wheezing, rales, rhonchi.  Not tachypneic or using any accessory muscles to breathe Cardiovascular: Regular rate and rhythm slightly on the faster side.  No appreciable lower extremity edema Abdomen: Soft, nontender, slightly distended.  Bowel sounds present in 4 quadrants GU: Deferred Musculoskeletal: No contractures or cyanosis.  No joint deformity noted Skin: Skin is warm and dry no appreciable rashes or lesions of emergency evaluation Neurologic: Cranial nerves II through XII  grossly intact no appreciable focal deficits Psychiatric: Normal mood and affect.  Intact judgment insight.  Patient is awake, alert and oriented x3.  Data Reviewed: I have personally reviewed following labs and imaging studies  CBC: Recent Labs  Lab 08/08/18 1718 08/09/18 0406 08/10/18 0321 08/11/18 0321 08/11/18 1654 08/12/18 0524  WBC 19.8* 25.5* 15.7* 15.7* 17.5* 14.0*  NEUTROABS 15.8*  --  11.8* 11.2* 12.2* 10.0*  HGB 16.4* 13.0 10.9* 11.1* 10.7* 10.3*  HCT 53.3* 42.3 35.6* 34.7* 33.5* 32.6*  MCV 89.4 89.4 90.1 84.4 87.5 85.1  PLT 300 212 148* 128* 137* 131*   Basic Metabolic Panel: Recent Labs  Lab 08/09/18 0406 08/09/18 2026 08/10/18 0321 08/11/18 0321 08/12/18 0524  NA 151* 139 139 135 134*  K 3.4* 3.5 3.6 3.4* 3.6  CL 121* 110 108 107 106  CO2 20* 21* 22 21* 20*  GLUCOSE 178* 297* 315* 290* 378*  BUN 34* 24* 20 16 17   CREATININE 1.23*  1.28* 1.16* 1.15* 1.11* 0.91  CALCIUM 8.6* 8.3* 8.2* 7.9* 7.8*  MG  --   --  1.9 1.6* 1.9  PHOS  --   --  1.6* 2.0* 2.3*   GFR: Estimated Creatinine Clearance: 46.8 mL/min (by C-G formula based on SCr of 0.91 mg/dL). Liver Function Tests: Recent Labs  Lab 08/08/18 1718 08/10/18 0321 08/11/18 0321 08/12/18 0524  AST 13* 11* 10* 13*   ALT 13 9 9 11   ALKPHOS 123 59 68 77  BILITOT 1.2 1.1 0.3 0.5  PROT 9.1* 5.2* 5.3* 5.0*  ALBUMIN 4.5 2.5* 2.4* 2.4*   Recent Labs  Lab 08/08/18 1718  LIPASE 83*   No results for input(s): AMMONIA in the last 168 hours. Coagulation Profile: Recent Labs  Lab 08/08/18 1718  INR 1.01   Cardiac Enzymes: No results for input(s): CKTOTAL, CKMB, CKMBINDEX, TROPONINI in the last 168 hours. BNP (last 3 results) No results for input(s): PROBNP in the last 8760 hours. HbA1C: No results for input(s): HGBA1C in the last 72 hours. CBG: Recent Labs  Lab 08/11/18 1201 08/11/18 1636 08/11/18 2132 08/12/18 0749 08/12/18 1143  GLUCAP 229* 220* 187* 317* 327*   Lipid Profile: No results for input(s): CHOL, HDL, LDLCALC, TRIG, CHOLHDL, LDLDIRECT in the last 72 hours. Thyroid Function Tests: No results for input(s): TSH, T4TOTAL, FREET4, T3FREE, THYROIDAB in the last 72 hours. Anemia Panel: No results for input(s): VITAMINB12, FOLATE, FERRITIN, TIBC, IRON, RETICCTPCT in the last 72 hours. Sepsis Labs: Recent Labs  Lab 08/08/18 1657 08/09/18 0223 08/09/18 0819 08/10/18 0321 08/11/18 0601  PROCALCITON  --   --  0.91 0.49 0.29  LATICACIDVEN 3.59* 1.7  --   --   --     Recent Results (from the past 240 hour(s))  Blood Culture (routine x 2)     Status: None (Preliminary result)   Collection Time: 08/08/18  4:28 PM  Result Value Ref Range Status   Specimen Description   Final    BLOOD LEFT HAND Performed at [facility], [address] [place], [place] [address]    Special Requests   Final    BOTTLES DRAWN AEROBIC ONLY Blood Culture adequate volume Performed at [facility], [address], [place], [place] [address]    Culture   Final    NO GROWTH 3 DAYS Performed at [facility], [address], [place], [place] [address]    Report Status PENDING  Incomplete  Blood Culture (routine x 2)  Status: None (Preliminary result)    Collection Time: 08/08/18  5:18 PM  Result Value Ref Range Status   Specimen Description   Final    BLOOD LEFT ANTECUBITAL Performed at [facility], [address], [place], [place] [address]    Special Requests   Final    BOTTLES DRAWN AEROBIC AND ANAEROBIC Blood Culture adequate volume Performed at [facility], [address] [place], [place] [address]    Culture   Final    NO GROWTH 4 DAYS Performed at [facility], [address] [place], [place] [address]    Report Status PENDING  Incomplete  MRSA PCR Screening     Status: None   Collection Time: 08/08/18  8:20 PM  Result Value Ref Range Status   MRSA by PCR NEGATIVE NEGATIVE Final    Comment:        The GeneXpert MRSA Assay (FDA approved for NASAL specimens only), is one component of a comprehensive MRSA colonization surveillance program. It is not intended to diagnose MRSA infection nor to guide or monitor treatment for MRSA infections. Performed at [facility], [address] [place], [place] [address]   Culture, Urine     Status: Abnormal   Collection Time: 08/09/18 11:44 AM  Result Value Ref Range Status   Specimen Description   Final    URINE, RANDOM Performed at [facility], [address], [place], [place] [address]    Special Requests   Final    NONE Performed at [facility], [address], [place], [place] [address]    Culture (A)  Final    20,000 COLONIES/mL MULTIPLE SPECIES PRESENT, SUGGEST RECOLLECTION   Report Status 08/10/2018 FINAL  Final    Radiology Studies: No results found. Scheduled Meds: . cholecalciferol  2,000 Units Oral Daily  . donepezil  5 mg Oral Daily  . enoxaparin (LOVENOX) injection  40 mg Subcutaneous Q24H  . insulin aspart  0-20 Units Subcutaneous TID WC  . insulin aspart  0-5 Units Subcutaneous QHS  . insulin aspart  6 Units Subcutaneous TID WC  . insulin glargine   22 Units Subcutaneous Daily  . levothyroxine  50 mcg Oral Q0600  . mouth rinse  15 mL Mouth Rinse BID  . metoprolol succinate  25 mg Oral Daily  . montelukast  10 mg Oral QHS  . multivitamin with minerals  1 tablet Oral Daily  . oxybutynin  10 mg Oral Daily  . pantoprazole  40 mg Oral Daily  . phosphorus  500 mg Oral BID  . potassium chloride  40 mEq Oral Once  . simvastatin  20 mg Oral QPM  . umeclidinium bromide  1 puff Inhalation Daily   Continuous Infusions: . ceFEPime (MAXIPIME) IV 1 g (08/12/18 0916)  .  sodium bicarbonate (isotonic) infusion in sterile water 50 mL/hr at 08/12/18 0913  . sodium chloride      LOS: 3 days   Merlene Laughter, DO [facility] PAGER is on AMION  If 7PM-7AM, please contact night-coverage [email or web address] Password [email or web address] 08/12/2018, 12:54 PM

## 2018-08-12 NOTE — Progress Notes (Signed)
Pharmacy Antibiotic Note  [name] is a 77 y.o. female admitted on 08/08/2018 with sepsis, unknown source.  CXR w/o active disease, cultures pending, MRSA PCR negative.  Pharmacy has been consulted for cefepime dosing, vancomycin has been d/c.  Today, 08/12/2018 Day #4 abx Afebrile WBC 14, improved SCr 0.91, improved PCT 0.91 > 0.49 > 0.29  Plan:  Continue Cefepime 1g IV q12h.  Follow up renal fxn, culture results, and clinical course.  F/u duration of therapy   Height: 5\' 2"  (157.5 cm) Weight: 160 lb 4.4 oz (72.7 kg) IBW/kg (Calculated) : 50.1  Temp (24hrs), Avg:98.4 F (36.9 C), Min:98 F (36.7 C), Max:98.8 F (37.1 C)  Recent Labs  Lab 08/08/18 1657  08/09/18 0223 08/09/18 0406 08/09/18 2026 08/10/18 0321 08/11/18 0321 08/11/18 1654 08/12/18 0524  WBC  --    < >  --  25.5*  --  15.7* 15.7* 17.5* 14.0*  CREATININE 1.60*   < >  --  1.23*  1.28* 1.16* 1.15* 1.11*  --  0.91  LATICACIDVEN 3.59*  --  1.7  --   --   --   --   --   --    < > = values in this interval not displayed.    Estimated Creatinine Clearance: 46.8 mL/min (by C-G formula based on SCr of 0.91 mg/dL).    Allergies  Allergen Reactions  . Codeine Nausea And Vomiting and Other (See Comments)    Hallucinations   . Darvon [Propoxyphene Hcl]   . Fentanyl Other (See Comments)    Pt reports duragesic patches cause her to hallucinate.   . Oxycodone Hcl Nausea And Vomiting and Other (See Comments)    Hallucinate   . Propoxyphene N-Acetaminophen   . Valium [Diazepam]   . Valsartan    Antimicrobials this admission: 11/6 Cefepime >>  11/6 Vancomycin  >> 11/7  Dose adjustments this admission: 11/7 increase cefepime 1g q12h  Microbiology results: 11/6 BCx: ngtd 11/6 MRSA PCR: negative 11/6 Influenza A/B: negative 11/7 UCx: mx spp  11/9 UCx:  Thank you for allowing pharmacy to be a part of this patient's care.  13/9, PharmD, BCPS Pager: [phone] 08/12/2018 11:25  AM

## 2018-08-13 LAB — CBC WITH DIFFERENTIAL/PLATELET
Abs Immature Granulocytes: 0.08 10*3/uL — ABNORMAL HIGH (ref 0.00–0.07)
Basophils Absolute: 0 10*3/uL (ref 0.0–0.1)
Basophils Relative: 0 %
EOS ABS: 0.1 10*3/uL (ref 0.0–0.5)
Eosinophils Relative: 1 %
HEMATOCRIT: 31.4 % — AB (ref 36.0–46.0)
Hemoglobin: 9.9 g/dL — ABNORMAL LOW (ref 12.0–15.0)
IMMATURE GRANULOCYTES: 1 %
Lymphocytes Relative: 28 %
Lymphs Abs: 4.2 10*3/uL — ABNORMAL HIGH (ref 0.7–4.0)
MCH: 27.3 pg (ref 26.0–34.0)
MCHC: 31.5 g/dL (ref 30.0–36.0)
MCV: 86.7 fL (ref 80.0–100.0)
Monocytes Absolute: 1 10*3/uL (ref 0.1–1.0)
Monocytes Relative: 7 %
NEUTROS PCT: 63 %
Neutro Abs: 9.3 10*3/uL — ABNORMAL HIGH (ref 1.7–7.7)
Platelets: 154 10*3/uL (ref 150–400)
RBC: 3.62 MIL/uL — ABNORMAL LOW (ref 3.87–5.11)
RDW: 14.6 % (ref 11.5–15.5)
WBC: 14.6 10*3/uL — ABNORMAL HIGH (ref 4.0–10.5)
nRBC: 0 % (ref 0.0–0.2)

## 2018-08-13 LAB — COMPREHENSIVE METABOLIC PANEL
ALT: 14 U/L (ref 0–44)
AST: 20 U/L (ref 15–41)
Albumin: 2.4 g/dL — ABNORMAL LOW (ref 3.5–5.0)
Alkaline Phosphatase: 60 U/L (ref 38–126)
Anion gap: 9 (ref 5–15)
BUN: 12 mg/dL (ref 8–23)
CHLORIDE: 108 mmol/L (ref 98–111)
CO2: 26 mmol/L (ref 22–32)
CREATININE: 0.85 mg/dL (ref 0.44–1.00)
Calcium: 7.9 mg/dL — ABNORMAL LOW (ref 8.9–10.3)
GFR calc non Af Amer: >60 mL/min (ref >60)
Glucose, Bld: 144 mg/dL — ABNORMAL HIGH (ref 70–99)
POTASSIUM: 3.2 mmol/L — AB (ref 3.5–5.1)
Sodium: 143 mmol/L (ref 135–145)
Total Bilirubin: 0.6 mg/dL (ref 0.3–1.2)
Total Protein: 5.2 g/dL — ABNORMAL LOW (ref 6.5–8.1)

## 2018-08-13 LAB — URINE CULTURE: Culture: <10000 — AB

## 2018-08-13 LAB — CREATININE, SERUM
CREATININE: 0.9 mg/dL (ref 0.44–1.00)
GFR calc Af Amer: >60 mL/min (ref >60)
GFR, EST NON AFRICAN AMERICAN: 59 mL/min — AB (ref >60)

## 2018-08-13 LAB — MAGNESIUM: MAGNESIUM: 1.7 mg/dL (ref 1.7–2.4)

## 2018-08-13 LAB — GLUCOSE, CAPILLARY
GLUCOSE-CAPILLARY: 105 mg/dL — AB (ref 70–99)
GLUCOSE-CAPILLARY: 115 mg/dL — AB (ref 70–99)
Glucose-Capillary: 138 mg/dL — ABNORMAL HIGH (ref 70–99)
Glucose-Capillary: 138 mg/dL — ABNORMAL HIGH (ref 70–99)

## 2018-08-13 LAB — CULTURE, BLOOD (ROUTINE X 2)
Culture: NO GROWTH
Special Requests: ADEQUATE

## 2018-08-13 LAB — PHOSPHORUS: PHOSPHORUS: 3 mg/dL (ref 2.5–4.6)

## 2018-08-13 MED ORDER — POTASSIUM CHLORIDE CRYS ER 20 MEQ PO TBCR
40.0000 meq | EXTENDED_RELEASE_TABLET | Freq: Two times a day (BID) | ORAL | Status: AC
  Administered 2018-08-13 (×2): 40 meq via ORAL
  Filled 2018-08-13 (×2): qty 2

## 2018-08-13 MED ORDER — MAGNESIUM SULFATE 2 GM/50ML IV SOLN
2.0000 g | Freq: Once | INTRAVENOUS | Status: AC
  Administered 2018-08-13: 2 g via INTRAVENOUS
  Filled 2018-08-13: qty 50

## 2018-08-13 NOTE — Progress Notes (Signed)
Clinical Social Worker following patient for support and discharge needs. CSW received text message from [facility] on floor stating that patient and family are refusing SNF. Per RNCM patient will go home with Home Health.  Marrianne Mood, MSW,  Theresia Majors [phone]

## 2018-08-13 NOTE — Progress Notes (Signed)
Physical Therapy Treatment Patient Details Name: [name] MRN: [id number] DOB: [birth year] Today's Date: 08/13/2018    History of Present Illness  QUIANNA AVERY is a 77 y.o. female with medical history significant of dementia, type 2 diabetes, asthma, COPD, hypertension, presenting to the hospital via EMS for evaluation of hyperglycemia  with BS 700s and AMS-->dx with DKA    PT Comments    The patient di ambulate in room with RW, required assist for standing. Patient frequently reported feeling weak and dizzy. BP 114/74. The patient reports that she desires DC home but will be home alone . Reports that son stops by frequently. Patient currently will require assistance for safety and ADL's and ensure recovery from illness.   Recommended SNF but pt declined.    Follow Up Recommendations  SNF(HH)     Equipment Recommendations  Rolling walker with 5" wheels;3in1 (PT)    Recommendations for Other Services   OT    Precautions / Restrictions Precautions Precautions: Fall    Mobility  Bed Mobility   Bed Mobility: Supine to Sit;Sit to Supine     Supine to sit: Supervision Sit to supine: Supervision   General bed mobility comments: extra time to allow patient to self assit.  Transfers Overall transfer level: Needs assistance Equipment used: Rolling walker (2 wheeled) Transfers: Sit to/from Stand Sit to Stand: Min assist;Mod assist         General transfer comment: assot to rise from bed and toilet, required rail and /or lifting assist.  Ambulation/Gait Ambulation/Gait assistance: Min assist Gait Distance (Feet): 20 Feet(x  2) Assistive device: Rolling walker (2 wheeled) Gait Pattern/deviations: Step-to pattern     General Gait Details: slow speed to ambulate in room with RW. Patient stopped x 3  stating'I amd dizzy and weak". Patient ambulated to BR then back to bed. BP 117/74. HR 104   Stairs             Wheelchair Mobility    Modified Rankin  (Stroke Patients Only)       Balance Overall balance assessment: Needs assistance Sitting-balance support: Feet supported;No upper extremity supported Sitting balance-Leahy Scale: Good     Standing balance support: During functional activity;No upper extremity supported;Single extremity supported   Standing balance comment: for self pericare                             Cognition Arousal/Alertness: Awake/alert Behavior During Therapy: WFL for tasks assessed/performed Overall Cognitive Status: Within Functional Limits for tasks assessed                                        Exercises      General Comments        Pertinent Vitals/Pain Pain Assessment: No/denies pain    Home Living                      Prior Function            PT Goals (current goals can now be found in the care plan section) Progress towards PT goals: Progressing toward goals    Frequency    Min 3X/week      PT Plan Current plan remains appropriate    Co-evaluation              AM-PAC PT "6 Clicks"  Daily Activity  Outcome Measure  Difficulty turning over in bed (including adjusting bedclothes, sheets and blankets)?: A Lot Difficulty moving from lying on back to sitting on the side of the bed? : A Lot Difficulty sitting down on and standing up from a chair with arms (e.g., wheelchair, bedside commode, etc,.)?: A Lot Help needed moving to and from a bed to chair (including a wheelchair)?: A Lot Help needed walking in hospital room?: Total Help needed climbing 3-5 steps with a railing? : Total 6 Click Score: 10    End of Session Equipment Utilized During Treatment: Gait belt Activity Tolerance: Patient limited by fatigue Patient left: in bed;with call bell/phone within reach;with bed alarm set Nurse Communication: Mobility status PT Visit Diagnosis: Difficulty in walking, not elsewhere classified (R26.2);Muscle weakness (generalized) (M62.81)      Time: 3419-3790 PT Time Calculation (min) (ACUTE ONLY): 29 min  Charges:  $Gait Training: 8-22 mins $Self Care/Home Management: 8-22                     Blanchard Kelch PT Acute Rehabilitation Services Pager [phone] Office [phone]    Rada Hay 08/13/2018, 4:58 PM

## 2018-08-13 NOTE — Care Management Note (Signed)
Case Management Note  Patient Details  Name: [name] MRN: [id number] Date of Birth: 14-May-1941  Subjective/Objective:DKA, New DM. From home alone,has son support.Patient states she plans to d/c home.[facility] chosen-rep Clydie Braun aware-HHRN/PT/OT/aide,social Financial controller. Patient states she doesn't have a rw-will await PT to re eval again today to determine if rw needs to be ordered.                    Action/Plan:dc home w/HHC.   Expected Discharge Date:  (unknown)               Expected Discharge Plan:  Home/Self Care  In-House Referral:     Discharge planning Services  CM Consult  Post Acute Care Choice:    Choice offered to:  Patient  DME Arranged:    DME Agency:     HH Arranged:    HH Agency:     Status of Service:  In process, will continue to follow  If discussed at Long Length of Stay Meetings, dates discussed:    Additional Comments:  Lanier Clam, RN 08/13/2018, 12:08 PM

## 2018-08-13 NOTE — Progress Notes (Signed)
PROGRESS NOTE    [name]  ZOX:[id number] DOB: 07-Oct-1940 DOA: 08/08/2018 PCP: Gwenyth Bender, MD  Brief Narrative:  HPI per Dr. John Giovanni on 08/08/18 HPI: [name] is a 77 y.o. female with medical history significant of dementia, type 2 diabetes, asthma, COPD, hypertension, hyperlipidemia presenting to the hospital via EMS for evaluation of hyperglycemia and AMS.  Patient was somnolent, history provided by family at bedside.  Son states that the patient had URI symptoms 2 weeks ago for which she went to the ED 6 days ago and was prescribed prednisone and inhalers.  States she initially improved but then decompensated again.  When family went to check on her today, she was found on the couch and was incoherent.  She did not recognize her family.  Son states that the patient does not take her medications correctly and thinks she might be taking old medications.  ED Course: Afebrile, tachycardic, tachypneic, not hypotensive, and satting well on room air.  Blood glucose greater than 700.  Bicarb 16 and anion gap 25.  VBG showing pH 7.31.  Lactic acid 3.59.  White count 19.8.  Flu panel pending. Lipase mildly elevated (83).  BNP 34.  Serum creatinine 1.9, was 0.8 in January 2018 per care everywhere.  I-STAT troponin negative and EKG not suggestive of ACS.  Chest x-ray showing no active disease.  Patient received 1.5 L normal saline boluses in the ED and was started on insulin infusion.  TRH paged to admit.  **Early (08/09/18) morning the patient was transitioned to long-acting insulin and was given at 6 AM this morning.  Insulin drip gtt stopped and electrolytes were replaced.  No urinalysis and urine cultures collected so one has been sent and patient has been complaining of some burning discomfort in her urine.  Likely this is a source of her infection as a cause of her DKA. Urine Cx was obtained after Abx were initiated but showed 20,000 CFU of Multiple species. Patient's Urinary  symptoms are improved but blood sugars still remain uncontrolled and insulin is further being adjusted.  Diabetes education coordinator to provide insulin teaching and since the patient has not family had refused SNF will need to arrange home health services along with diabetic education teaching and instruction of how to give her insulin.  Diabetes coordinator to stop by tomorrow to provide the teaching and instructed patient son to give insulin.  Assessment & Plan:   Principal Problem:   DKA (diabetic ketoacidoses) (HCC) Active Problems:   Hypothyroidism   HLD (hyperlipidemia)   Asthma   GERD   Sepsis (HCC)   AKI (acute kidney injury) (HCC)   Acute metabolic encephalopathy   COPD (chronic obstructive pulmonary disease) (HCC)   HTN (hypertension)   Dementia (HCC)   Overactive bladder   Physical deconditioning  DKA, improved  Uncontrolled Hyperglycemia in the Setting of Diabetes Mellitus Type 2 -No recent A1c in the chart but one done this visit showed HbA1c of 10.3  -Metformin is her only home diabetes medication; not on insulin normally but likely will need to go on insulin at home -Tachycardic and Tachypneic on arrival.  -Blood glucose was greater than 700.   -Bicarb 16 and anion gap 25 on Admission.  -VBG showing pH 7.316/34.5/44.3   -Appeared dehydrated.  Patient had an elevated white count and lactic acidosis; Infectious source could be urine -She improved and was stable to transfer out of SDU to General Medical Floor with Telemetry  -Started on Bicarbonate  gtt again given her worsening CO2 as it trended down from 22 -> 20 -Diet Advanced to Heart Healthy/Carb Modified and Insulin infusion per DKA protocol stopped -IV fluid resuscitation. Continude on normal saline until blood glucose less than 250, then switch to D5 one half normal saline and then just to D5W; D5W no stopped as patient is eating  -Check Beta-Hydroxybutyrate level was 4.11.   -UA as below -BMP every 4 hours  now stopped  -Transitioned to Long Acting Insulin 10 units and Moderate Novolog SSI AC/HS;  -Insulin adjusted and is now on 2 units of Lantus subcu daily, resistant NovoLog sliding scale insulin before meals and at bedtime, and 6 units of NovoLog 3 times daily with meals -CBG's ranging from 105-138 -Consulted Diabetes Education Coordinator for further evaluation and recommendations and diabetes coordinator to to instruct the patient and family how to give insulin injections -UDS was Negative  -Continue to adjust insulin as necessary and will need diabetic education teaching likely be discharged on home insulin given her uncontrolled hyperglycemia; I spoke with the diabetes education coordinator and patient and family to be educated tomorrow about insulin administration  Sepsis from suspected UTI -Afebrile, tachycardic, tachypneic, not hypotensive, and satting well on room air.      -White count went from 19.8 -> 25.5 -> 15.7 -> 15.7 -> 14.0 -> 14.6 -Chest x-ray showing no active disease. -IVF resuscitation restarted again with Sodium Bicarbonate gtt in Sterile Water at 50 mL/hr x 10 hours and now stopped -Started broad-spectrum antibiotics with Vancomycin and Cefepime; Vancomycin has been discontinued as MRSA PCR was Negative; Will continue IV Cefepime for now given that Urinalysis showed Multiple Species Present to complete course for 7 days total -Continue to Trend Lactate; LA went from 3.59 -> 1.7 -Procalcitonin 0.91 -> 0.49 -> 0.29 -Flu panel Negative via PCR -UA showed Cloudy Appearance, Large Leukocytes, Nitrite, Few Bacteria, 21-50 RBC/HPF, and >50 WBC -Blood culture x2 showed NGTD at 5 Days -Urine Cx obtained and showed 20,000 CFU of Multiple Species and advised recollection which was sent again yesterday and is still pending  -Continue to Monitor and follow Cx's  -Repeat CBC in AM   Acute Metabolic Encephalopathy, improved  -Likely related to DKA and sepsis.   -No gross focal  neuro deficits on exam. -Management as above -Much Improved but has baseline dementia.  Asthma and COPD -Stable.   -Not wheezing.   -Continue home Inhalers with Incruse Ellipta and PRN Albuterol 3 mL q6hprn Wheezing and SOB  Hypertension -Blood pressure currently stable and was 110/55 -Continue home Metoprolol Succinate 25 mg daily -Hold home calcium channel blocker at this time -Continue to Monitor BP's per protocol   Hyperlipidemia -Continue home Simvastatin 20 mg po qHS  Dementia -Continue home Donepezil 5 mg po Daily   Hypothyroidism -Continue home Levothyroxine 50 mcg po Daily  -Checked TSH and was 4.202  Overactive Badder -Continue home Oxybutynin 10 mg po Daily   GERD -Continue PPI with Pantoprazole 40 mg po Daily  Physical deconditioning/Generalized Weakness -PT to Evaluate and Treat; Recommending SNF vs. HHPT; PT to re-evaluate and still recommending SNF but patient refusing  Hypernatremia/Hyperchloremia  -In the setting of IV fluid hydration with normal saline -Fluids were changed to D5W however now stopped giving her hyperglycemia -Improved; Na+ is now 143 and Chloride is 100 -Started Sodium Bicarbonate gtt 150 mEQ and now stopped  -Continue monitor and trend and repeat BMP later today and repeat CMP in a.m.  Hypokalemia -Patient potassium level  was 3.2 -Replete with p.o. potassium chloride 40 mg twice daily x2 doses -Continue monitor and replete as necessary -Repeat CMP in a.m.  Hypophosphatemia -Patient's Phos Level was 3.0 -Continue to Monitor and Replete as Necessary -Repeat Phos Level in AM   Hypomagnesemia -Patient's Mag Level was 1.7 -Continue to Monitor and Replete as Necessary -Repeat Mag Level in the AM   AKI in the setting of dehydration from DKA and sepsis, improving  -Likely prerenal due to dehydration.  Serum creatinine 1.9, was 0.8 in January 2018 per care everywhere.   -Avoid nephrotoxic agents/contrast if possible    -Patient's BUN/creatinine on admission was 49/1.98 and after fluid hydration improved to 12/0.85 -IV fluid adjusted and changed to Sodium Bicarbonate 150 mEQ at 50 mL/hr for 10 hours -Continue to monitor and repeat CMP in a.m. -Repeat CMP in the a.m.  DVT prophylaxis: Enoxaparin 30 mg sq q24h Code Status: FULL CODE Family Communication: No family present at bedside Disposition Plan: SNF vs. Home Health PT pending on progress; Consulted Social Work for Assistance with Placement in case needs SNF. Stable to transfer out of SDU today to GMF with Telemetry  Consultants:   None   Procedures:  None   Antimicrobials:  Anti-infectives (From admission, onward)   Start     Dose/Rate Route Frequency Ordered Stop   08/10/18 2200  vancomycin (VANCOCIN) IVPB 750 mg/150 ml premix  Status:  Discontinued     750 mg 150 mL/hr over 60 Minutes Intravenous Every 48 hours 08/08/18 2057 08/09/18 0903   08/09/18 2100  ceFEPIme (MAXIPIME) 1 g in sodium chloride 0.9 % 100 mL IVPB  Status:  Discontinued     1 g 200 mL/hr over 30 Minutes Intravenous Every 24 hours 08/08/18 2057 08/09/18 1028   08/09/18 1030  ceFEPIme (MAXIPIME) 1 g in sodium chloride 0.9 % 100 mL IVPB     1 g 200 mL/hr over 30 Minutes Intravenous Every 12 hours 08/09/18 1028 08/14/18 2359   08/08/18 2200  vancomycin (VANCOCIN) 1,500 mg in sodium chloride 0.9 % 500 mL IVPB     1,500 mg 250 mL/hr over 120 Minutes Intravenous  Once 08/08/18 2009 08/09/18 0110   08/08/18 2100  ceFEPIme (MAXIPIME) 2 g in sodium chloride 0.9 % 100 mL IVPB     2 g 200 mL/hr over 30 Minutes Intravenous  Once 08/08/18 2009 08/08/18 2251     Subjective: Seen and examined at bedside was doing okay.  Denies any chest pain, lightheadedness or dizziness.  Wanted to know when she can go home.  No other concerns required at this time.  No nausea or vomiting.  Objective: Vitals:   08/12/18 2035 08/13/18 0436 08/13/18 0801 08/13/18 1455  BP: (!) 118/56 118/71  (!)  110/55  Pulse: 98 98  99  Resp: 18 16  18   Temp: 99 F (37.2 C) 98.9 F (37.2 C)  98.6 F (37 C)  TempSrc: Oral Oral  Oral  SpO2: 97% 97% 97% 98%  Weight:      Height:        Intake/Output Summary (Last 24 hours) at 08/13/2018 1726 Last data filed at 08/13/2018 1400 Gross per 24 hour  Intake 710.07 ml  Output 200 ml  Net 510.07 ml   Filed Weights   08/08/18 2015  Weight: 72.7 kg   Examination: Physical Exam:  Constitutional: Patient is well-nourished, well-developed African-American female currently no acute distress appears calm and comfortable laying in bed Eyes: Lids and conjunctive are normal.  Sclera anicteric ENMT: External ears and nose appear normal.  Mucous members are moist Neck: Appears supple no JVD Respiratory: Diminished to auscultation bilaterally no appreciable wheezing, rales, rhonchi.  Patient not tachypneic using accessory muscle breathe Cardiovascular: Regular rate and rhythm but on the faster side.  Trace lower extremity edema noted Abdomen: Soft, nontender, slightly distended.  Bowel sounds present all 4 quadrants GU: Deferred Musculoskeletal: No contractures or cyanosis. Skin: Skin is warm and dry no appreciable rashes lesions limited skin evaluation Neurologic: Cranial nerves II through XII grossly intact no appreciable focal deficit Psychiatric: Normal mood and affect.  Intact judgment intact.  Patient is awake and alert and oriented  Data Reviewed: I have personally reviewed following labs and imaging studies  CBC: Recent Labs  Lab 08/10/18 0321 08/11/18 0321 08/11/18 1654 08/12/18 0524 08/13/18 0820  WBC 15.7* 15.7* 17.5* 14.0* 14.6*  NEUTROABS 11.8* 11.2* 12.2* 10.0* 9.3*  HGB 10.9* 11.1* 10.7* 10.3* 9.9*  HCT 35.6* 34.7* 33.5* 32.6* 31.4*  MCV 90.1 84.4 87.5 85.1 86.7  PLT 148* 128* 137* 131* 154   Basic Metabolic Panel: Recent Labs  Lab 08/09/18 2026 08/10/18 0321 08/11/18 0321 08/12/18 0524 08/13/18 0526 08/13/18 0820    NA 139 139 135 134*  --  143  K 3.5 3.6 3.4* 3.6  --  3.2*  CL 110 108 107 106  --  108  CO2 21* 22 21* 20*  --  26  GLUCOSE 297* 315* 290* 378*  --  144*  BUN 24* 20 16 17   --  12  CREATININE 1.16* 1.15* 1.11* 0.91 0.90 0.85  CALCIUM 8.3* 8.2* 7.9* 7.8*  --  7.9*  MG  --  1.9 1.6* 1.9  --  1.7  PHOS  --  1.6* 2.0* 2.3*  --  3.0   GFR: Estimated Creatinine Clearance: 50.1 mL/min (by C-G formula based on SCr of 0.85 mg/dL). Liver Function Tests: Recent Labs  Lab 08/08/18 1718 08/10/18 0321 08/11/18 0321 08/12/18 0524 08/13/18 0820  AST 13* 11* 10* 13* 20  ALT 13 9 9 11 14   ALKPHOS 123 59 68 77 60  BILITOT 1.2 1.1 0.3 0.5 0.6  PROT 9.1* 5.2* 5.3* 5.0* 5.2*  ALBUMIN 4.5 2.5* 2.4* 2.4* 2.4*   Recent Labs  Lab 08/08/18 1718  LIPASE 83*   No results for input(s): AMMONIA in the last 168 hours. Coagulation Profile: Recent Labs  Lab 08/08/18 1718  INR 1.01   Cardiac Enzymes: No results for input(s): CKTOTAL, CKMB, CKMBINDEX, TROPONINI in the last 168 hours. BNP (last 3 results) No results for input(s): PROBNP in the last 8760 hours. HbA1C: No results for input(s): HGBA1C in the last 72 hours. CBG: Recent Labs  Lab 08/12/18 1737 08/12/18 2126 08/13/18 0800 08/13/18 1135 08/13/18 1652  GLUCAP 101* 124* 138* 105* 138*   Lipid Profile: No results for input(s): CHOL, HDL, LDLCALC, TRIG, CHOLHDL, LDLDIRECT in the last 72 hours. Thyroid Function Tests: No results for input(s): TSH, T4TOTAL, FREET4, T3FREE, THYROIDAB in the last 72 hours. Anemia Panel: No results for input(s): VITAMINB12, FOLATE, FERRITIN, TIBC, IRON, RETICCTPCT in the last 72 hours. Sepsis Labs: Recent Labs  Lab 08/08/18 1657 08/09/18 0223 08/09/18 0819 08/10/18 0321 08/11/18 0601  PROCALCITON  --   --  0.91 0.49 0.29  LATICACIDVEN 3.59* 1.7  --   --   --     Recent Results (from the past 240 hour(s))  Blood Culture (routine x 2)     Status: None (Preliminary result)  Collection Time:  08/08/18  4:28 PM  Result Value Ref Range Status   Specimen Description   Final    BLOOD LEFT HAND Performed at [facility], [address] [place], [place] [address]    Special Requests   Final    BOTTLES DRAWN AEROBIC ONLY Blood Culture adequate volume Performed at [facility], [address] [place], [place] [address]    Culture   Final    NO GROWTH 4 DAYS Performed at [facility], [address], [place], [place] [address]    Report Status PENDING  Incomplete  Blood Culture (routine x 2)     Status: None   Collection Time: 08/08/18  5:18 PM  Result Value Ref Range Status   Specimen Description   Final    BLOOD LEFT ANTECUBITAL Performed at [facility], [address], [place], [place] [address]    Special Requests   Final    BOTTLES DRAWN AEROBIC AND ANAEROBIC Blood Culture adequate volume Performed at [facility], [address], [place], [place] [address]    Culture   Final    NO GROWTH 5 DAYS Performed at [facility], [address] [place], [place] [address]    Report Status 08/13/2018 FINAL  Final  MRSA PCR Screening     Status: None   Collection Time: 08/08/18  8:20 PM  Result Value Ref Range Status   MRSA by PCR NEGATIVE NEGATIVE Final    Comment:        The GeneXpert MRSA Assay (FDA approved for NASAL specimens only), is one component of a comprehensive MRSA colonization surveillance program. It is not intended to diagnose MRSA infection nor to guide or monitor treatment for MRSA infections. Performed at [facility], [address], [place], [place] [address]   Culture, Urine     Status: Abnormal   Collection Time: 08/09/18 11:44 AM  Result Value Ref Range Status   Specimen Description   Final    URINE, RANDOM Performed at [facility], [address], [place], [place] [address]    Special Requests   Final     NONE Performed at [facility], [address], [place], [place] [address]    Culture (A)  Final    20,000 COLONIES/mL MULTIPLE SPECIES PRESENT, SUGGEST RECOLLECTION   Report Status 08/10/2018 FINAL  Final  Culture, Urine     Status: Abnormal   Collection Time: 08/11/18  7:50 AM  Result Value Ref Range Status   Specimen Description   Final    URINE, RANDOM Performed at [facility], [address] [place], [place] [address]    Special Requests   Final    NONE Performed at [facility], [address] [place], [place] [address]    Culture (A)  Final    <10,000 COLONIES/mL INSIGNIFICANT GROWTH Performed at [facility], [address], [place], [place] [address]    Report Status 08/13/2018 FINAL  Final    Radiology Studies: No results found. Scheduled Meds: . cholecalciferol  2,000 Units Oral Daily  . donepezil  5 mg Oral Daily  . enoxaparin (LOVENOX) injection  40 mg Subcutaneous Q24H  . insulin aspart  0-20 Units Subcutaneous TID WC  . insulin aspart  0-5 Units Subcutaneous QHS  . insulin aspart  6 Units Subcutaneous TID WC  . insulin glargine  22 Units Subcutaneous  Daily  . levothyroxine  50 mcg Oral Q0600  . mouth rinse  15 mL Mouth Rinse BID  . metoprolol succinate  25 mg Oral Daily  . montelukast  10 mg Oral QHS  . multivitamin with minerals  1 tablet Oral Daily  . oxybutynin  10 mg Oral Daily  . pantoprazole  40 mg Oral Daily  . potassium chloride  40 mEq Oral Once  . potassium chloride  40 mEq Oral BID  . simvastatin  20 mg Oral QPM  . umeclidinium bromide  1 puff Inhalation Daily   Continuous Infusions: . ceFEPime (MAXIPIME) IV 1 g (08/13/18 0956)  . sodium chloride      LOS: 4 days   Merlene Laughter, DO [facility] PAGER is on AMION  If 7PM-7AM, please contact night-coverage [email or web address] Password [facility] 08/13/2018, 5:26 PM

## 2018-08-14 LAB — PHOSPHORUS: PHOSPHORUS: 2.7 mg/dL (ref 2.5–4.6)

## 2018-08-14 LAB — COMPREHENSIVE METABOLIC PANEL
ALK PHOS: 57 U/L (ref 38–126)
ALT: 18 U/L (ref 0–44)
ANION GAP: 7 (ref 5–15)
AST: 22 U/L (ref 15–41)
Albumin: 2.4 g/dL — ABNORMAL LOW (ref 3.5–5.0)
BILIRUBIN TOTAL: 0.4 mg/dL (ref 0.3–1.2)
BUN: 9 mg/dL (ref 8–23)
CALCIUM: 8 mg/dL — AB (ref 8.9–10.3)
CO2: 26 mmol/L (ref 22–32)
Chloride: 109 mmol/L (ref 98–111)
Creatinine, Ser: 0.86 mg/dL (ref 0.44–1.00)
Glucose, Bld: 135 mg/dL — ABNORMAL HIGH (ref 70–99)
Potassium: 4.2 mmol/L (ref 3.5–5.1)
Sodium: 142 mmol/L (ref 135–145)
TOTAL PROTEIN: 5.2 g/dL — AB (ref 6.5–8.1)

## 2018-08-14 LAB — CBC WITH DIFFERENTIAL/PLATELET
Abs Immature Granulocytes: 0.13 10*3/uL — ABNORMAL HIGH (ref 0.00–0.07)
Basophils Absolute: 0 10*3/uL (ref 0.0–0.1)
Basophils Relative: 0 %
EOS ABS: 0.1 10*3/uL (ref 0.0–0.5)
EOS PCT: 1 %
HEMATOCRIT: 30.2 % — AB (ref 36.0–46.0)
Hemoglobin: 9.4 g/dL — ABNORMAL LOW (ref 12.0–15.0)
Immature Granulocytes: 1 %
LYMPHS ABS: 4.2 10*3/uL — AB (ref 0.7–4.0)
LYMPHS PCT: 29 %
MCH: 27.8 pg (ref 26.0–34.0)
MCHC: 31.1 g/dL (ref 30.0–36.0)
MCV: 89.3 fL (ref 80.0–100.0)
MONO ABS: 1.1 10*3/uL — AB (ref 0.1–1.0)
Monocytes Relative: 8 %
NEUTROS PCT: 61 %
NRBC: 0 % (ref 0.0–0.2)
Neutro Abs: 9.1 10*3/uL — ABNORMAL HIGH (ref 1.7–7.7)
Platelets: 190 10*3/uL (ref 150–400)
RBC: 3.38 MIL/uL — ABNORMAL LOW (ref 3.87–5.11)
RDW: 14.9 % (ref 11.5–15.5)
WBC: 14.7 10*3/uL — AB (ref 4.0–10.5)

## 2018-08-14 LAB — CULTURE, BLOOD (ROUTINE X 2)
Culture: NO GROWTH
Special Requests: ADEQUATE

## 2018-08-14 LAB — GLUCOSE, CAPILLARY
GLUCOSE-CAPILLARY: 137 mg/dL — AB (ref 70–99)
GLUCOSE-CAPILLARY: 103 mg/dL — AB (ref 70–99)
Glucose-Capillary: 259 mg/dL — ABNORMAL HIGH (ref 70–99)
Glucose-Capillary: 116 mg/dL — ABNORMAL HIGH (ref 70–99)

## 2018-08-14 LAB — MAGNESIUM: MAGNESIUM: 2.2 mg/dL (ref 1.7–2.4)

## 2018-08-14 MED ORDER — BLOOD GLUCOSE MONITOR KIT: PACK | 0 refills | Status: AC

## 2018-08-14 MED ORDER — PEN NEEDLES 31G X 5 MM MISC: 1.0000 | Freq: Every day | 0 refills | Status: DC

## 2018-08-14 MED ORDER — NYSTATIN-TRIAMCINOLONE 100000-0.1 UNIT/GM-% EX CREA
TOPICAL_CREAM | Freq: Two times a day (BID) | CUTANEOUS | Status: DC
  Administered 2018-08-15 – 2018-08-16 (×3): via TOPICAL
  Filled 2018-08-14: qty 30

## 2018-08-14 MED ORDER — INSULIN GLARGINE 100 UNITS/ML SOLOSTAR PEN: 20.0000 [IU] | PEN_INJECTOR | Freq: Every day | SUBCUTANEOUS | 0 refills | Status: DC

## 2018-08-14 NOTE — Progress Notes (Signed)
Inpatient Diabetes Program Recommendations  AACE/ADA: New Consensus Statement on Inpatient Glycemic Control (2015)  Target Ranges:  Prepandial:   less than 140 mg/dL      Peak postprandial:   less than 180 mg/dL (1-2 hours)      Critically ill patients:  140 - 180 mg/dL   Lab Results  Component Value Date   GLUCAP 137 (H) 08/14/2018   HGBA1C 10.3 (H) 08/08/2018    Review of Glycemic Control  Spoked briefly to pt regarding going home on insulin. Pt states "I used to give my mother insulin." Prefers insulin pen over vial and syringe. Demonstrated how to use insulin pen. Pt called son and he will be here at 2 pm to learn insulin pen administration. Stressed importance of checking blood sugars at least 3x/day and taking logbook to PCP for review.  NOTE: pt now decided to be discharged to SNF.   Inpatient Diabetes Program Recommendations:     Lantus 20 units QAM Novolog 0-15 units tidwc and hs Novolog 6 units tidwc for meal coverage if pt eats > 50% meal.  To discharge to SNF. Will meet son today at 2 pm for insulin teaching. (Just so he knows how to give insulin if he needs to)  Discussed with case manager and RN.  Thank you. Ailene Ards, RD, LDN, CDE Inpatient Diabetes Coordinator [phone]

## 2018-08-14 NOTE — Care Management Note (Signed)
Case Management Note  Patient Details  Name: [name] MRN: [id number] Date of Birth: 01/24/1941  Subjective/Objective: CM/CSW went into rm to discuss d/c plans-patient agreed to speaker phone with Merlene Laughter [phone]-Patient adamantly says she will d/c home- patient will stay with [address], [place] [address]. [facility] for HHC HHRN/PT/OT/aide,CSW,rw,3n1. They will have own transp home.MD updated.                  Action/Plan:djc home w/HHC/dme   Expected Discharge Date:  (unknown)               Expected Discharge Plan:  Home w Home Health Services  In-House Referral:  Clinical Social Work  Discharge planning Services  CM Consult  Post Acute Care Choice:    Choice offered to:  Patient  DME Arranged:  3-N-1, [place] rolling DME Agency:  [facility]  HH Arranged:  RN, PT, OT, Nurse's Aide, Social Work [employer] Agency:  [facility]  Status of Service:  Completed, signed off  If discussed at [employer], dates discussed:    Additional Comments:  Lanier Clam, RN 08/14/2018, 12:12 PM

## 2018-08-14 NOTE — Evaluation (Signed)
Occupational Therapy Evaluation Patient Details Name: [name] MRN: [id number] DOB: Sep 03, 1941 Today's Date: 08/14/2018    History of Present Illness  [name] is a 77 y.o. female with medical history significant of dementia, type 2 diabetes, asthma, COPD, hypertension, presenting to the hospital via EMS for evaluation of hyperglycemia  with BS 700s and AMS-->dx with DKA   Clinical Impression   Pt admitted with with hypergluycemia.  Pt currently with functional limitations due to the deficits listed below (see OT Problem List).  Pt will benefit from skilled OT to increase their safety and independence with ADL and functional mobility for ADL to facilitate discharge to venue listed below.      Follow Up Recommendations  Home health OT;Supervision/Assistance - 24 hour    Equipment Recommendations  3 in 1 bedside commode    Recommendations for Other Services       Precautions / Restrictions Precautions Precautions: Fall      Mobility Bed Mobility Overal bed mobility: Needs Assistance Bed Mobility: Supine to Sit;Sit to Supine     Supine to sit: Supervision Sit to supine: Supervision   General bed mobility comments: extra time to allow patient to self assit.  Transfers Overall transfer level: Needs assistance Equipment used: Rolling walker (2 wheeled) Transfers: Sit to/from [employer] Sit to Stand: Min assist;Mod assist         General transfer comment: assot to rise from bed and toilet, required rail and /or lifting assist.    Balance Overall balance assessment: Needs assistance Sitting-balance support: Feet supported;No upper extremity supported Sitting balance-Leahy Scale: Good     Standing balance support: During functional activity;No upper extremity supported;Single extremity supported   Standing balance comment: for self pericare                            ADL either performed or assessed with clinical  judgement   ADL Overall ADL's : Needs assistance/impaired Eating/Feeding: Set up;Sitting   Grooming: Set up;Sitting   Upper Body Bathing: Set up;Sitting   Lower Body Bathing: Moderate assistance;Sit to/from stand;Cueing for sequencing;Cueing for safety   Upper Body Dressing : Set up;Sitting   Lower Body Dressing: Moderate assistance;Sit to/from stand;Cueing for sequencing;Cueing for safety   Toilet Transfer: Minimal assistance;RW;Comfort height toilet;Ambulation;Stand-pivot   Toileting- Clothing Manipulation and Hygiene: Minimal assistance;Sit to/from stand;Cueing for sequencing;Cueing for safety                            Pertinent Vitals/Pain Pain Assessment: No/denies pain     Hand Dominance     Extremity/Trunk Assessment Upper Extremity Assessment Upper Extremity Assessment: Generalized weakness           Communication Communication Communication: No difficulties   Cognition Arousal/Alertness: Awake/alert Behavior During Therapy: WFL for tasks assessed/performed Overall Cognitive Status: Within Functional Limits for tasks assessed                                                Home Living Family/patient expects to be discharged to:: Private residence Living Arrangements: Alone Available Help at Discharge: Available PRN/intermittently Type of Home: House Home Access: Stairs to enter [employer] of Steps: 3   Home Layout: One level     Bathroom Shower/Tub: Tub/shower unit;Walk-in shower  Bathroom Toilet: Standard     Home Equipment: None          Prior Functioning/Environment Level of Independence: Independent                 OT Problem List: Decreased strength;Impaired balance (sitting and/or standing);Decreased knowledge of use of DME or AE      OT Treatment/Interventions: Self-care/ADL training;Patient/family education;DME and/or AE instruction    OT Goals(Current goals can be found in the  care plan section) Acute Rehab OT Goals Patient Stated Goal: to sons OT Goal Formulation: With patient Time For Goal Achievement: 08/21/18 Potential to Achieve Goals: Good  OT Frequency: Min 2X/week   Barriers to D/C:               AM-PAC PT "6 Clicks" Daily Activity     Outcome Measure Help from another person eating meals?: None Help from another person taking care of personal grooming?: A Little Help from another person toileting, which includes using toliet, bedpan, or urinal?: A Little Help from another person bathing (including washing, rinsing, drying)?: A Little Help from another person to put on and taking off regular upper body clothing?: A Little Help from another person to put on and taking off regular lower body clothing?: A Lot 6 Click Score: 18   End of Session Equipment Utilized During Treatment: Rolling walker;Gait belt Nurse Communication: Mobility status  Activity Tolerance: Patient tolerated treatment well Patient left: in chair;with call bell/phone within reach  OT Visit Diagnosis: Unsteadiness on feet (R26.81);Other abnormalities of gait and mobility (R26.89);Repeated falls (R29.6);History of falling (Z91.81);Muscle weakness (generalized) (M62.81)                Time: 6283-6629 OT Time Calculation (min): 25 min Charges:  OT General Charges $OT Visit: 1 Visit OT Evaluation $OT Eval Moderate Complexity: 1 Mod OT Treatments $Self Care/Home Management : 8-22 mins  Lise Auer, OT Acute Rehabilitation Services [phone] Office- [phone]     Telesa Jeancharles, Karin Golden D 08/14/2018, 3:36 PM

## 2018-08-14 NOTE — Care Management Note (Signed)
Case Management Note  Patient Details  Name: [name] MRN: [id number] Date of Birth: September 26, 1941  Subjective/Objective: Patient has started medicare appeal process-we have sent all info to our care management dept to send to medicare-await outcome. MD/staff all updated.                   Action/Plan:awaiting appeal process outcome.   Expected Discharge Date:  08/14/18               Expected Discharge Plan:  Home w Home Health Services  In-House Referral:  Clinical Social Work  Discharge planning Services  CM Consult  Post Acute Care Choice:    Choice offered to:  Patient  DME Arranged:  3-N-1, Walker rolling DME Agency:  [facility]  HH Arranged:  RN, PT, OT, Nurse's Aide, Social Work [employer] Agency:  [facility]  Status of Service:  Completed, signed off  If discussed at [employer], dates discussed:    Additional Comments:  Lanier Clam, RN 08/14/2018, 3:48 PM

## 2018-08-14 NOTE — Progress Notes (Signed)
TC [name] Minerva Areola per his request of d/c order being placed-[name] voiced understanding.

## 2018-08-14 NOTE — Discharge Summary (Signed)
Physician Discharge Summary  [name] ZGY:[id number] DOB: 1941-08-06 DOA: 08/08/2018  PCP: Rogers Blocker, MD  Admit date: 08/08/2018 Discharge date: 08/14/2018  Admitted From: Home Disposition:  Home with Katherine PT/OT/RN/Aide/SW as patient refused SNF placement   Recommendations for Outpatient Follow-up:  1. Follow up with PCP in 1-2 weeks 2. Follow up with Diabetes Education Coordinator as an outpatient with Ambulatory Referral 3. Follow up with Hematology as an outpatient for Leukocytosis (suspected Chronic) 4. Please obtain CMP/CBC, Mag, Phos in one week 5. Please follow up on the following pending results:  Home Health: Yes as she refused SNF Equipment/Devices: Conservation officer, nature with 5" Wheels; 3in1  Discharge Condition: Stable  CODE STATUS: FULL CODE Diet recommendation: Heart Healthy Carb Modified Diet  Brief/Interim Summary: HPI per Dr. Shela Mcconnell on 08/08/18 RFF:[id number] [name] a 77 y.o.femalewith medical history significant ofdementia, type 2 diabetes, asthma, COPD, hypertension, hyperlipidemia presenting to the hospital via EMS for evaluation of hyperglycemiaand AMS.Patient was somnolent, history provided by family at bedside. Son states that the patient had URI symptoms 2 weeks ago for which she went to the ED 6 days ago and was prescribed prednisone and inhalers. States she initially improved but then decompensated again. When family went to check on her today, she was found on the couch and was incoherent. She did not recognize her family. Son states that the patient does not take her medications correctly and thinks she might be taking old medications.  ED Course:Afebrile, tachycardic, tachypneic, not hypotensive, and satting well on room air. Blood glucose greater than 700. Bicarb 16 and anion gap 25. VBG showing pH 7.31. Lactic acid 3.59. White count 19.8. Flu panel pending. Lipase mildly elevated (83). BNP 34. Serum creatinine  1.9,was 0.8 in January 2018 per care everywhere. I-STAT troponin negative and EKG not suggestive of ACS. Chest x-ray showing no active disease. Patient received 1.5 L normal saline boluses in the ED and was started on insulin infusion. TRH paged to admit.  **Early (08/09/18) morning the patient was transitioned to long-acting insulin and was given at 6 AM this morning.  Insulin drip gtt stopped and electrolytes were replaced.  No urinalysis and urine cultures collected so one has been sent and patient has been complaining of some burning discomfort in her urine.  Likely this is a source of her infection as a cause of her DKA. Urine Cx was obtained after Abx were initiated but showed 20,000 CFU of Multiple species. Patient's Urinary symptoms are improved but blood sugars still remain uncontrolled and insulin was further being adjusted and the blood sugars improved.  Diabetes education coordinator to provide insulin teaching and since the patient and family had refused SNF, will need to arrange home health services along with diabetic education teaching and instruction of how to give her insulin.  Diabetes coordinator educated the patient and son and patient was deemed medically stable to D/C.  He will need to follow-up with her primary care physician [name] along with diabetes education coordinator and hematology in outpatient setting.  A referral was made to the hematology clinic for her leukocytosis.    ADDENDUM 16:00: Patient has been deemed medically stable to be discharged home with home health services however family has appealed the discharge and appeal process is currently underway.  Discharge Diagnoses:  Principal Problem:   DKA (diabetic ketoacidoses) ([name]) Active Problems:   Hypothyroidism   HLD (hyperlipidemia)   Asthma   GERD   Sepsis ([facility])   AKI (acute  kidney injury) ([facility])   Acute metabolic encephalopathy   COPD (chronic obstructive pulmonary disease) ([facility])   HTN  (hypertension)   Dementia ([facility])   Overactive bladder   Physical deconditioning  DKA, improved  Uncontrolled Hyperglycemia in the Setting of Diabetes Mellitus Type 2 -No recent A1c inthechart but one done this visit showed HbA1c of 10.3 -Metformin is her only home diabetes medication;not on insulin normally but likely will need to go on insulin at home -Tachycardic and Tachypneic on arrival.  -Blood glucose was greater than 700.  -Bicarb 16 and anion gap 25 on Admission.  -VBG showing pH 7.316/34.5/44.3  -Appeared dehydrated. Patient had an elevated white count and lactic acidosis; Infectious source could be urine -She improved and was stable and transferred to Medical Floor with Telemetry -Started on Bicarbonate gtt again given her worsening CO2 as it trended down from 22 -> 20 -Diet Advanced to Heart Healthy/Carb Modified and Insulin infusion per DKA protocol stopped -IV fluid resuscitation.Continude on normal saline until blood glucose less than 250, then switch to D5 one half normal saline and then just to D5W; D5W no stopped as patient is eating  -Check Beta-Hydroxybutyrate level was 4.11. -UA as below -BMP every 4 hours now stopped  -Transitioned to Long Acting Insulin 10 units and Moderate Novolog SSI AC/HS;  -Insulin adjusted and is now on 2 units of Lantus subcu daily, resistant NovoLog sliding scale insulin before meals and at bedtime, and 6 units of NovoLog 3 times daily with meals -CBG's ranging from 103-259 -Consulted Diabetes Education Coordinator for further evaluation and recommendations and diabetes coordinator to to instruct the patient and family how to give insulin injections -UDS was Negative  -Continue to adjust insulin as necessary and will need diabetic education teaching likely be discharged on home insulin given her uncontrolled hyperglycemia; Diabetes Education Coordinator educated the patient and son today -Will D/C on 20 units of Lantus and continue  Home Metformin -Stable to D/C Home as She improved and refused SNF placement   Sepsis from suspected UTI, improved  -Afebrile, tachycardic, tachypneic, not hypotensive, and satting well on room air.   -Sepsis Physiology improved  -White count went from 19.8 -> 25.5 -> 15.7 -> 15.7 -> 14.0 -> 14.6 -> 14.7 -Chest x-ray showing no active disease. -IVF resuscitation restarted again with Sodium Bicarbonate gtt in Sterile Water at 50 mL/hr x 10 hours and now stopped -Started broad-spectrum antibiotics with Vancomycin and Cefepime; Vancomycin has been discontinued as MRSA PCR was Negative; Will continue IV Cefepime for now given that Urinalysis showed Multiple Species Present to complete course for 7 days total -Continue to Trend Lactate; LA went from 3.59 -> 1.7 -Procalcitonin 0.91 -> 0.49 -> 0.29 -Flu panel Negative via PCR -UA showed Cloudy Appearance, Large Leukocytes, Nitrite, Few Bacteria, 21-50 RBC/HPF, and >50 WBC -Blood culture x2 showed NGTD at 5 Days -Urine Cx obtained and showed 20,000 CFU of Multiple Species and advised recollection which was sent again yesterday and is <10,000 CFU of Insignificant Growth  -Continue to Monitor and follow Cx's  -Repeat CBC as an outpatient   Acute Metabolic Encephalopathy, improved  -Likely related to DKA and sepsis. -No gross focal neuro deficits on exam. -Management as above -Much Improved but has baseline dementia.  Asthma and COPD -Stable.  -Not wheezing.  -Continue home Inhalers with Incruse Ellipta and Albuterol   Hypertension -Blood pressure currently stable and was 102/51 -Continue home Metoprolol Succinate 25 mg daily -Hold home calcium channel Mcconnell at this time  but resume at D/C -Continue to Monitor BP's per protocol   Hyperlipidemia -Continue home Simvastatin 20 mg po qHS  Dementia -Continue home Donepezil 5 mg po Daily   Hypothyroidism -Continue home Levothyroxine 50 mcg po Daily  -Checked TSH and was  4.202  Overactive Badder -Continue home Oxybutynin 10 mg po Daily   GERD -Continue PPI with Pantoprazole 40 mg po Daily  Physical deconditioning/Generalized Weakness -PT to Evaluate and Treat; Recommending SNF vs. HHPT; PT to re-evaluate and still recommending SNF but patient refusing; Initially agreed but changed her mind and adamant about going home -[place] with PT/OT/RN/Aide/SW and 3in1 and RW  Hypernatremia/Hyperchloremia  -In the setting of IV fluid hydration with normal saline -Fluids were changed to D5W however now stopped giving her hyperglycemia; They were then changed to Sodium Bicarbonate -Improved; Na+ is now 142 and Chloride is 109 -Started Sodium Bicarbonate gtt 150 mEQ and now stopped  -Continue monitor and trend and Repeat CMP as an outpatient   Hypokalemia -Patient potassium level was 3.2 and improved to 4.2 -Replete with p.o. potassium chloride 40 mg twice daily x2 doses yesterday  -Continue monitor and replete as necessary -Repeat CMP as an outpatient   Hypophosphatemia -Patient's Phos Level was 2.7 -Continue to Monitor and Replete as Necessary -Repeat Phos Level as an outpatient   Hypomagnesemia -Patient's Mag Level was 2.2 -Continue to Monitor and Replete as Necessary -Repeat Mag Level as an outpatient   AKI in the setting of dehydration from DKA and sepsis, improved -Likely prerenal due to dehydration.Serum creatinine 1.9,was 0.8 in January 2018 per care everywhere. -Avoid nephrotoxic agents/contrast if possible  -Patient's BUN/creatinine on admission was 49/1.98 and after fluid hydration improved to 9/0.86 -IV fluid now D/C'd  -Continue to monitor and repeat CMP in a.Katherine. -Repeat CMP as an outpatient   Leukocytosis -Treated with Broad Spectrum Cefepime -Appears to be Chronic -Outpatient follow up with Hematology   Discharge Instructions  Discharge Instructions    Ambulatory referral to Hematology   Complete  by:  As directed    For Elevated Leukocytosis; ? Chronicity   Ambulatory referral to Nutrition and Diabetic Education   Complete by:  As directed    Uncontrolled Diabetes   Call MD for:  difficulty breathing, headache or visual disturbances   Complete by:  As directed    Call MD for:  extreme fatigue   Complete by:  As directed    Call MD for:  hives   Complete by:  As directed    Call MD for:  persistant dizziness or light-headedness   Complete by:  As directed    Call MD for:  persistant nausea and vomiting   Complete by:  As directed    Call MD for:  redness, tenderness, or signs of infection (pain, swelling, redness, odor or green/yellow discharge around incision site)   Complete by:  As directed    Call MD for:  severe uncontrolled pain   Complete by:  As directed    Call MD for:  temperature >100.4   Complete by:  As directed    Diet - low sodium heart healthy   Complete by:  As directed    Diet Carb Modified   Complete by:  As directed    Discharge instructions   Complete by:  As directed    You were cared for by a hospitalist during your hospital stay. If you have any questions about your discharge medications or the care you received while  you were in the hospital after you are discharged, you can call the unit and ask to speak with the hospitalist on call if the hospitalist that took care of you is not available. Once you are discharged, your primary care physician will handle any further medical issues. Please note that NO REFILLS for any discharge medications will be authorized once you are discharged, as it is imperative that you return to your primary care physician (or establish a relationship with a primary care physician if you do not have one) for your aftercare needs so that they can reassess your need for medications and monitor your lab values.  Follow up with PCP, Hematology, and Diabetes Education Coordinator. Take all medications as prescribed. If symptoms change  or worsen please return to the ED for evaluation   Increase activity slowly   Complete by:  As directed      Allergies as of 08/14/2018      Reactions   Codeine Nausea And Vomiting, Other (See Comments)   Hallucinations    Darvon [propoxyphene Hcl]    Fentanyl Other (See Comments)   Pt reports duragesic patches cause her to hallucinate.    Oxycodone Hcl Nausea And Vomiting, Other (See Comments)   Hallucinate    Propoxyphene N-acetaminophen    Valium [diazepam]    Valsartan       Medication List    STOP taking these medications   diclofenac sodium 1 % Gel Commonly known as:  VOLTAREN     TAKE these medications   albuterol 108 (90 Base) MCG/ACT inhaler Commonly known as:  PROVENTIL HFA;VENTOLIN HFA Inhale 1-2 puffs into the lungs every 6 (six) hours as needed for wheezing or shortness of breath.   aspirin 81 MG chewable tablet Chew 81 mg by mouth See admin instructions. Monday Wednesday Friday   blood glucose meter kit and supplies Kit Dispense based on patient and insurance preference. Use up to four times daily as directed. (FOR ICD-9 250.00, 250.01).   donepezil 5 MG tablet Commonly known as:  ARICEPT Take 5 mg by mouth daily.   guaifenesin 100 MG/5ML syrup Commonly known as:  ROBITUSSIN Take 100 mg by mouth daily as needed for cough.   insulin glargine 100 unit/mL Sopn Commonly known as:  LANTUS Inject 0.2 mLs (20 Units total) into the skin daily.   levothyroxine 50 MCG tablet Commonly known as:  SYNTHROID, LEVOTHROID Take 50 mcg by mouth daily before breakfast.   meclizine 12.5 MG tablet Commonly known as:  ANTIVERT Take 12.5 mg by mouth 3 (three) times daily as needed for dizziness.   metFORMIN 500 MG 24 hr tablet Commonly known as:  GLUCOPHAGE-XR Take 250 mg by mouth daily.   metoprolol succinate 25 MG 24 hr tablet Commonly known as:  TOPROL-XL Take 25 mg by mouth daily.   montelukast 10 MG tablet Commonly known as:  SINGULAIR Take 10 mg by  mouth daily.   multivitamin with minerals Tabs tablet Take 1 tablet by mouth daily.   omeprazole 40 MG capsule Commonly known as:  PRILOSEC Take 40 mg by mouth daily.   ONE TOUCH ULTRA TEST test strip Generic drug:  glucose blood   onetouch ultrasoft lancets   oxybutynin 10 MG 24 hr tablet Commonly known as:  DITROPAN-XL Take 10 mg by mouth daily.   Pen Needles 31G X 5 MM Misc 1 Container by Does not apply route daily.   RA VITAMIN D-3 50 MCG (2000 UT) Caps Generic drug:  Cholecalciferol Take  1 capsule by mouth daily.   simvastatin 20 MG tablet Commonly known as:  ZOCOR Take 20 mg by mouth every evening.   TAZTIA XT 120 MG 24 hr capsule Generic drug:  diltiazem Take 120 mg by mouth daily.   tiotropium 18 MCG inhalation capsule Commonly known as:  SPIRIVA Place 18 mcg into inhaler and inhale daily.   traMADol-acetaminophen 37.5-325 MG tablet Commonly known as:  ULTRACET Take 2 tablets by mouth three times a day if needed            Durable Medical Equipment  (From admission, onward)         Start     Ordered   08/14/18 1256  DME 3-in-1  Once     08/14/18 1300   08/14/18 1256  For home use only DME Walker rolling  [facility])  Once    Question:  Patient needs a walker to treat with the following condition  Answer:  Physical deconditioning   08/14/18 1300   08/14/18 1048  For home use only DME Walker rolling  Once    Question:  Patient needs a walker to treat with the following condition  Answer:  Unsteady gait   08/14/18 [facility] Follow up.   Why:  bedside, commode,rolling walker Contact information: [place] [address] [phone]        [facility] Follow up.   Specialty:  Home Health Services Why:  [facility] nursing/physical therapy,occupational therapy/aide,social worker Contact information: [address] [place]  [address] [phone]        Rogers Blocker, MD. Call.   Specialty:  Internal Medicine Why:  Follow up within 1 week  Contact information: [address] [place] [address] [phone]          Allergies  Allergen Reactions  . Codeine Nausea And Vomiting and Other (See Comments)    Hallucinations   . Darvon [Propoxyphene Hcl]   . Fentanyl Other (See Comments)    Pt reports duragesic patches cause her to hallucinate.   . Oxycodone Hcl Nausea And Vomiting and Other (See Comments)    Hallucinate   . Propoxyphene N-Acetaminophen   . Valium [Diazepam]   . Valsartan    Consultations:  Diabetes Education Coordinator  Procedures/Studies: Dg Chest 2 View  Result Date: 08/02/2018 CLINICAL DATA:  Cough, chest tightness EXAM: CHEST - 2 VIEW COMPARISON:  07/10/2014 FINDINGS: Heart and mediastinal contours are within normal limits. No focal opacities or effusions. No acute bony abnormality. Thoracolumbar scoliosis. IMPRESSION: No active cardiopulmonary disease. Electronically Signed   By: Rolm Baptise Katherine.D.   On: 08/02/2018 16:13   Dg Chest Port 1 View  Result Date: 08/08/2018 CLINICAL DATA:  77 y/o F; found on couch lethargic, short of breath, weakness, sick for 2 days. EXAM: PORTABLE CHEST 1 VIEW COMPARISON:  08/02/2018 chest radiograph FINDINGS: Stable heart size and mediastinal contours are within normal limits. Both lungs are clear. Mild reverse S curvature of the spine. Osteoarthrosis of the shoulder joints. IMPRESSION: No active disease. Electronically Signed   By: Kristine Garbe Katherine.D.   On: 08/08/2018 17:37    Subjective: Seen And examined at bedside was doing well and was awake, alert and oriented.  Wanting to go home.  Had no complaints or concerns and is much improved.  Discharge Exam: Vitals:   08/14/18 2020  08/14/18 2023  BP: (!) 96/53 (!) 102/51  Pulse: (!) 103   Resp: 20   Temp: 98.6 F (37 C)   SpO2: 97%    Vitals:   08/14/18 0803  08/14/18 1304 08/14/18 2020 08/14/18 2023  BP:  (!) 124/56 (!) 96/53 (!) 102/51  Pulse:  79 (!) 103   Resp:  16 20   Temp:  98.5 F (36.9 C) 98.6 F (37 C)   TempSrc:  Oral Oral   SpO2: 97% 99% 97%   Weight:      Height:       General: Pt is alert, awake, not in acute distress Cardiovascular: RRR, S1/S2 +, no rubs, no gallops Respiratory: CTA bilaterally, no wheezing, no rhonchi Abdominal: Soft, NT, ND, bowel sounds + Extremities: no edema, no cyanosis  The results of significant diagnostics from this hospitalization (including imaging, microbiology, ancillary and laboratory) are listed below for reference.    Microbiology: Recent Results (from the past 240 hour(s))  Blood Culture (routine x 2)     Status: None   Collection Time: 08/08/18  4:28 PM  Result Value Ref Range Status   Specimen Description   Final    BLOOD LEFT HAND Performed at [facility], [place] [address] [place], [place] [address]    Special Requests   Final    BOTTLES DRAWN AEROBIC ONLY Blood Culture adequate volume Performed at [facility] [address], [place], [place] [address]    Culture   Final    NO GROWTH 5 DAYS Performed at [facility], [place] [address], [place], [place] [address]    Report Status 08/14/2018 FINAL  Final  Blood Culture (routine x 2)     Status: None   Collection Time: 08/08/18  5:18 PM  Result Value Ref Range Status   Specimen Description   Final    BLOOD LEFT ANTECUBITAL Performed at [facility] [address] [place], [place] [address]    Special Requests   Final    BOTTLES DRAWN AEROBIC AND ANAEROBIC Blood Culture adequate volume Performed at [facility] [address], [place], [place] [address]    Culture   Final    NO GROWTH 5 DAYS Performed at [facility], [place] [address], [place], [place] [address]    Report Status 08/13/2018 FINAL  Final  MRSA PCR Screening     Status:  None   Collection Time: 08/08/18  8:20 PM  Result Value Ref Range Status   MRSA by PCR NEGATIVE NEGATIVE Final    Comment:        The GeneXpert MRSA Assay (FDA approved for NASAL specimens only), is one component of a comprehensive MRSA colonization surveillance program. It is not intended to diagnose MRSA infection nor to guide or monitor treatment for MRSA infections. Performed at [facility], [place] [address] [place], [place] [address]   Culture, Urine     Status: Abnormal   Collection Time: 08/09/18 11:44 AM  Result Value Ref Range Status   Specimen Description   Final    URINE, RANDOM Performed at [facility] [address], [place], [place] [address]    Special Requests   Final    NONE Performed at [facility], [place] [address], [place], [place] [address]    Culture (A)  Final    20,000 COLONIES/mL MULTIPLE SPECIES PRESENT, SUGGEST RECOLLECTION   Report Status 08/10/2018 FINAL  Final  Culture, Urine     Status: Abnormal   Collection Time: 08/11/18  7:50 AM  Result Value Ref Range Status   Specimen Description   Final    URINE, RANDOM Performed at [facility] [address] [place], [place] [address]    Special Requests   Final    NONE Performed at [facility], [place] [address] [place], [place] [address]    Culture (A)  Final    <10,000 COLONIES/mL INSIGNIFICANT GROWTH Performed at [facility] [address], [place], [place] [address]    Report Status 08/13/2018 FINAL  Final    Labs: BNP (last 3 results) Recent Labs    08/08/18 1718  BNP 10.2   Basic Metabolic Panel: Recent Labs  Lab 08/10/18 0321 08/11/18 0321 08/12/18 0524 08/13/18 0526 08/13/18 0820 08/14/18 0601  NA 139 135 134*  --  143 142  K 3.6 3.4* 3.6  --  3.2* 4.2  CL 108 107 106  --  108 109  CO2 22 21* 20*  --  26 26  GLUCOSE 315* 290* 378*  --  144* 135*  BUN _0 --   12 9  CREATININE 1.15* 1.11* 0.91 0.90 0.85 0.86  CALCIUM 8.2* 7.9* 7.8*  --  7.9* 8.0*  MG 1.9 1.6* 1.9  --  1.7 2.2  PHOS 1.6* 2.0* 2.3*  --  3.0 2.7   Liver Function Tests: Recent Labs  Lab 08/10/18 0321 08/11/18 0321 08/12/18 0524 08/13/18 0820 08/14/18 0601  AST 11* 10* 13* 20 22  ALT _1 ALKPHOS 59 68 77 60 57  BILITOT 1.1 0.3 0.5 0.6 0.4  PROT 5.2* 5.3* 5.0* 5.2* 5.2*  ALBUMIN 2.5* 2.4* 2.4* 2.4* 2.4*   Recent Labs  Lab 08/08/18 1718  LIPASE 83*   No results for input(s): AMMONIA in the last 168 hours. CBC: Recent Labs  Lab 08/11/18 0321 08/11/18 1654 08/12/18 0524 08/13/18 0820 08/14/18 0601  WBC 15.7* 17.5* 14.0* 14.6* 14.7*  NEUTROABS 11.2* 12.2* 10.0* 9.3* 9.1*  HGB 11.1* 10.7* 10.3* 9.9* 9.4*  HCT 34.7* 33.5* 32.6* 31.4* 30.2*  MCV 84.4 87.5 85.1 86.7 89.3  PLT 128* 137* 131* 154 190   Cardiac Enzymes: No results for input(s): CKTOTAL, CKMB, CKMBINDEX, TROPONINI in the last 168 hours. BNP: Invalid input(s): POCBNP CBG: Recent Labs  Lab 08/13/18 2049 08/14/18 0728 08/14/18 1135 08/14/18 1642 08/14/18 2022  GLUCAP 115* 137* 259* 103* 116*   D-Dimer No results for input(s): DDIMER in the last 72 hours. Hgb A1c No results for input(s): HGBA1C in the last 72 hours. Lipid Profile No results for input(s): CHOL, HDL, LDLCALC, TRIG, CHOLHDL, LDLDIRECT in the last 72 hours. Thyroid function studies No results for input(s): TSH, T4TOTAL, T3FREE, THYROIDAB in the last 72 hours.  Invalid input(s): FREET3 Anemia work up No results for input(s): VITAMINB12, FOLATE, FERRITIN, TIBC, IRON, RETICCTPCT in the last 72 hours. Urinalysis    Component Value Date/Time   COLORURINE YELLOW 08/09/2018 1143   APPEARANCEUR CLOUDY (A) 08/09/2018 1143   LABSPEC 1.018 08/09/2018 1143   PHURINE 5.0 08/09/2018 1143   GLUCOSEU 50 (A) 08/09/2018 1143   HGBUR SMALL (A) 08/09/2018 1143   BILIRUBINUR NEGATIVE 08/09/2018 1143   KETONESUR 5 (A) 08/09/2018  1143   PROTEINUR 30 (A) 08/09/2018 1143   UROBILINOGEN 0.2 06/02/2007 2044   NITRITE NEGATIVE 08/09/2018 1143   LEUKOCYTESUR LARGE (A) 08/09/2018 1143  Sepsis Labs Invalid input(s): PROCALCITONIN,  WBC,  LACTICIDVEN Microbiology Recent Results (from the past 240 hour(s))  Blood Culture (routine x 2)     Status: None   Collection Time: 08/08/18  4:28 PM  Result Value Ref Range Status   Specimen Description   Final    BLOOD LEFT HAND Performed at [facility], [place] [address], [place], [place] [address]    Special Requests   Final    BOTTLES DRAWN AEROBIC ONLY Blood Culture adequate volume Performed at [place] [address] [place], [place] [address]    Culture   Final    NO GROWTH 5 DAYS Performed at [facility], [place] [address], [place], [place] [address]    Report Status 08/14/2018 FINAL  Final  Blood Culture (routine x 2)     Status: None   Collection Time: 08/08/18  5:18 PM  Result Value Ref Range Status   Specimen Description   Final    BLOOD LEFT ANTECUBITAL Performed at [facility] [address], [place], [place] [address]    Special Requests   Final    BOTTLES DRAWN AEROBIC AND ANAEROBIC Blood Culture adequate volume Performed at [place] [address], [place], [place] [address]    Culture   Final    NO GROWTH 5 DAYS Performed at [facility], [place] [address] [place], [place] [address]    Report Status 08/13/2018 FINAL  Final  MRSA PCR Screening     Status: None   Collection Time: 08/08/18  8:20 PM  Result Value Ref Range Status   MRSA by PCR NEGATIVE NEGATIVE Final    Comment:        The GeneXpert MRSA Assay (FDA approved for NASAL specimens only), is one component of a comprehensive MRSA colonization surveillance program. It is not intended to diagnose MRSA infection nor to guide or monitor treatment for MRSA infections. Performed at [facility], [place] [address], [place], [place] [address]   Culture, Urine     Status: Abnormal   Collection Time: 08/09/18 11:44 AM  Result Value Ref Range Status   Specimen Description   Final    URINE, RANDOM Performed at [place] [address] [place], [place] [address]    Special Requests   Final    NONE Performed at [facility], [place] [address] [place], [place] [address]    Culture (A)  Final    20,000 COLONIES/mL MULTIPLE SPECIES PRESENT, SUGGEST RECOLLECTION   Report Status 08/10/2018 FINAL  Final  Culture, Urine     Status: Abnormal   Collection Time: 08/11/18  7:50 AM  Result Value Ref Range Status   Specimen Description   Final    URINE, RANDOM Performed at [facility] [address] [place], [place] [address]    Special Requests   Final    NONE Performed at [facility], [place] [address], [place], [place] [address]    Culture (A)  Final    <10,000 COLONIES/mL INSIGNIFICANT GROWTH Performed at [facility] [address] [place],  [address]    Report Status 08/13/2018 FINAL  Final   Time coordinating discharge: 35 minutes  SIGNED:  Kerney Elbe, DO [facility] 08/14/2018, 9:25 PM Pager is on [place]  If 7PM-7AM, please contact night-coverage [email or web address] Password [email or web address]

## 2018-08-15 DIAGNOSIS — E1165 Type 2 diabetes mellitus with hyperglycemia: Secondary | ICD-10-CM

## 2018-08-15 LAB — GLUCOSE, CAPILLARY
GLUCOSE-CAPILLARY: 125 mg/dL — AB (ref 70–99)
Glucose-Capillary: 195 mg/dL — ABNORMAL HIGH (ref 70–99)

## 2018-08-15 LAB — CREATININE, SERUM
Creatinine, Ser: 0.94 mg/dL (ref 0.44–1.00)
GFR, EST NON AFRICAN AMERICAN: 56 mL/min — AB (ref >60)

## 2018-08-15 MED ORDER — METOCLOPRAMIDE HCL 5 MG/ML IJ SOLN
10.0000 mg | Freq: Once | INTRAMUSCULAR | Status: AC
  Administered 2018-08-15: 10 mg via INTRAVENOUS
  Filled 2018-08-15: qty 2

## 2018-08-15 MED ORDER — KETOROLAC TROMETHAMINE 15 MG/ML IJ SOLN
15.0000 mg | Freq: Once | INTRAMUSCULAR | Status: AC
  Administered 2018-08-15: 15 mg via INTRAVENOUS
  Filled 2018-08-15: qty 1

## 2018-08-15 MED ORDER — DIPHENHYDRAMINE HCL 50 MG/ML IJ SOLN
25.0000 mg | Freq: Once | INTRAMUSCULAR | Status: AC
  Administered 2018-08-15: 25 mg via INTRAVENOUS
  Filled 2018-08-15: qty 1

## 2018-08-15 NOTE — Progress Notes (Signed)
Per medicare Appeal denied-[name] is aware CM spoke to him on Nurse's phone-he says he will pick his Mom up tomorrow by 10a to take her home with HHC. MD notified.

## 2018-08-15 NOTE — Progress Notes (Signed)
PROGRESS NOTE  [name] QBV:[id number] DOB: [birth year] DOA: 08/08/2018 PCP: Gwenyth Bender, MD  HPI/Brief Narrative  [name] is a 77 y.o. year old female with medical history significant for Crohn's disease s/p  colectomy (2016) with colostomy, multiple peri-inguinal and peri-gluteal wounds  who presented on 08/08/2018 with increased somnolence, URI symptoms for 2 weeks and was found to have hyperglycemia in the 700s on admission with anion gap of 25 pH of 7.31 consistent with DKA.  Patient did well with DKA protocol was able to transition to oral insulin.  Suspected trigger was likely sepsis from presumed UTI given no other overt infectious symptoms or etiology.  Empirically was treated with vancomycin and cefepime, culture data was unhelpful patient was continued on IV cefepime.  Was a planned discharge 11/12 however family appealed discharge and currently awaiting final verdict.  Subjective No acute complaints overnight  Assessment/Plan:  #Type 2 diabetes, poorly controlled A1c 10.3%.  No longer in DKA with anion gap closed, well-hydrated, tolerating Lantus 22 units with scheduled short-acting and close blood glucose monitoring.  #Suspected UTI.  Sepsis physiology has resolved.  No other overt signs or symptoms of infection on clinical exam or previous evaluation.  Completed 7-day course of IV antibiotics.  Will have follow-up with outpatient physician on discharge.  Acute metabolic encephalopathy, resolved.  I 42 multifactorial etiology with DKA and sepsis.  Asthma COPD, stable.  No wheezing on exam.  Continue home inhaler regimen.  Hypertension, stable.  Controlled on home Toprol.  Will likely be able to resume home calcium channel blocker upon discharge.  Dementia, stable continue home donepezil  Hypothyroidism, stable TSH within normal limits continue home Synthroid  Overactive bladder, stable continue home oxybutynin  GERD, stable continue home PPI  Physical  deconditioning/generalized weakness.  PT recommended skilled nursing facility, patient We will go home with home health physical therapy.    Hyponatremia/hypochloremia, resolved.  Related to IV fluid resuscitation  AKI in setting of dehydration from DKA and sepsis, resolved.  Normocytic anemia, unclear etiology last hemoglobin in system from 2013 was with was within normal limits.  Initially quite elevated at 16 on admission and and since has remained stable 10-11 with no signs or symptoms of blood loss.  Will encourage outpatient evaluation by PCP.Marland Kitchen   Code Status: Full code  Family Communication: No family at bedside  Disposition Plan: Anticipate discharge in 24 hours given discharge appeal was denied this afternoon.  Going home with home health physical therapy.   Consultants:  None     Procedures:  None  Antimicrobials: Anti-infectives (From admission, onward)   Start     Dose/Rate Route Frequency Ordered Stop   08/10/18 2200  vancomycin (VANCOCIN) IVPB 750 mg/150 ml premix  Status:  Discontinued     750 mg 150 mL/hr over 60 Minutes Intravenous Every 48 hours 08/08/18 2057 08/09/18 0903   08/09/18 2100  ceFEPIme (MAXIPIME) 1 g in sodium chloride 0.9 % 100 mL IVPB  Status:  Discontinued     1 g 200 mL/hr over 30 Minutes Intravenous Every 24 hours 08/08/18 2057 08/09/18 1028   08/09/18 1030  ceFEPIme (MAXIPIME) 1 g in sodium chloride 0.9 % 100 mL IVPB     1 g 200 mL/hr over 30 Minutes Intravenous Every 12 hours 08/09/18 1028 08/14/18 2204   08/08/18 2200  vancomycin (VANCOCIN) 1,500 mg in sodium chloride 0.9 % 500 mL IVPB     1,500 mg 250 mL/hr over 120 Minutes Intravenous  Once 08/08/18 2009 08/09/18 0110   08/08/18 2100  ceFEPIme (MAXIPIME) 2 g in sodium chloride 0.9 % 100 mL IVPB     2 g 200 mL/hr over 30 Minutes Intravenous  Once 08/08/18 2009 08/08/18 2251         Cultures:  Blood cultures 11/6 remain negative.  Urine culture 11/7: 20,000 colonies multiple  species present.  Telemetry: Yes  DVT prophylaxis: Lovenox   Objective: Vitals:   08/15/18 0856 08/15/18 0950 08/15/18 1325 08/15/18 2117  BP:  (!) 130/57 (!) 124/54 (!) 125/57  Pulse:  (!) 101 (!) 101 92  Resp:  14 (!) 24 18  Temp:  98.6 F (37 C) 99.3 F (37.4 C) 98.2 F (36.8 C)  TempSrc:  Oral Oral Oral  SpO2: 97% 96% 97% 97%  Weight:      Height:        Intake/Output Summary (Last 24 hours) at 08/15/2018 2222 Last data filed at 08/15/2018 1844 Gross per 24 hour  Intake 420 ml  Output -  Net 420 ml   Filed Weights   08/08/18 2015  Weight: 72.7 kg    Exam:  Constitutional:normal appearing elderly female Eyes: EOMI, anicteric, normal conjunctivae ENMT: Oropharynx with moist mucous membranes,  Cardiovascular: RRR no MRGs, with no peripheral edema Respiratory: Normal respiratory effort, clear breath sounds  Abdomen: Soft,non-tender, with no HSM Skin: No rash ulcers, or lesions. Without skin tenting  Neurologic: Grossly no focal neuro deficit. Psychiatric:Appropriate affect, and mood. Mental status AAOx3  Data Reviewed: CBC: Recent Labs  Lab 08/11/18 0321 08/11/18 1654 08/12/18 0524 08/13/18 0820 08/14/18 0601  WBC 15.7* 17.5* 14.0* 14.6* 14.7*  NEUTROABS 11.2* 12.2* 10.0* 9.3* 9.1*  HGB 11.1* 10.7* 10.3* 9.9* 9.4*  HCT 34.7* 33.5* 32.6* 31.4* 30.2*  MCV 84.4 87.5 85.1 86.7 89.3  PLT 128* 137* 131* 154 190   Basic Metabolic Panel: Recent Labs  Lab 08/10/18 0321 08/11/18 0321 08/12/18 0524 08/13/18 0526 08/13/18 0820 08/14/18 0601 08/15/18 0524  NA 139 135 134*  --  143 142  --   K 3.6 3.4* 3.6  --  3.2* 4.2  --   CL 108 107 106  --  108 109  --   CO2 22 21* 20*  --  26 26  --   GLUCOSE 315* 290* 378*  --  144* 135*  --   BUN 20 16 17   --  12 9  --   CREATININE 1.15* 1.11* 0.91 0.90 0.85 0.86 0.94  CALCIUM 8.2* 7.9* 7.8*  --  7.9* 8.0*  --   MG 1.9 1.6* 1.9  --  1.7 2.2  --   PHOS 1.6* 2.0* 2.3*  --  3.0 2.7  --    GFR: Estimated  Creatinine Clearance: 45.3 mL/min (by C-G formula based on SCr of 0.94 mg/dL). Liver Function Tests: Recent Labs  Lab 08/10/18 0321 08/11/18 0321 08/12/18 0524 08/13/18 0820 08/14/18 0601  AST 11* 10* 13* 20 22  ALT 9 9 11 14 18   ALKPHOS 59 68 77 60 57  BILITOT 1.1 0.3 0.5 0.6 0.4  PROT 5.2* 5.3* 5.0* 5.2* 5.2*  ALBUMIN 2.5* 2.4* 2.4* 2.4* 2.4*   No results for input(s): LIPASE, AMYLASE in the last 168 hours. No results for input(s): AMMONIA in the last 168 hours. Coagulation Profile: No results for input(s): INR, PROTIME in the last 168 hours. Cardiac Enzymes: No results for input(s): CKTOTAL, CKMB, CKMBINDEX, TROPONINI in the last 168 hours. BNP (last 3 results) No  results for input(s): PROBNP in the last 8760 hours. HbA1C: No results for input(s): HGBA1C in the last 72 hours. CBG: Recent Labs  Lab 08/14/18 1135 08/14/18 1642 08/14/18 2022 08/15/18 0722 08/15/18 1135  GLUCAP 259* 103* 116* 125* 195*   Lipid Profile: No results for input(s): CHOL, HDL, LDLCALC, TRIG, CHOLHDL, LDLDIRECT in the last 72 hours. Thyroid Function Tests: No results for input(s): TSH, T4TOTAL, FREET4, T3FREE, THYROIDAB in the last 72 hours. Anemia Panel: No results for input(s): VITAMINB12, FOLATE, FERRITIN, TIBC, IRON, RETICCTPCT in the last 72 hours. Urine analysis:    Component Value Date/Time   COLORURINE YELLOW 08/09/2018 1143   APPEARANCEUR CLOUDY (A) 08/09/2018 1143   LABSPEC 1.018 08/09/2018 1143   PHURINE 5.0 08/09/2018 1143   GLUCOSEU 50 (A) 08/09/2018 1143   HGBUR SMALL (A) 08/09/2018 1143   BILIRUBINUR NEGATIVE 08/09/2018 1143   KETONESUR 5 (A) 08/09/2018 1143   PROTEINUR 30 (A) 08/09/2018 1143   UROBILINOGEN 0.2 06/02/2007 2044   NITRITE NEGATIVE 08/09/2018 1143   LEUKOCYTESUR LARGE (A) 08/09/2018 1143   Sepsis Labs: @LABRCNTIP (procalcitonin:4,lacticidven:4)  ) Recent Results (from the past 240 hour(s))  Blood Culture (routine x 2)     Status: None   Collection  Time: 08/08/18  4:28 PM  Result Value Ref Range Status   Specimen Description   Final    BLOOD LEFT HAND Performed at [facility], [address] [place], [place]    Special Requests   Final    BOTTLES DRAWN AEROBIC ONLY Blood Culture adequate volume Performed at [facility], [address] [place], [place]    Culture   Final    NO GROWTH 5 DAYS Performed at [facility], [address], [place], [place]    Report Status 08/14/2018 FINAL  Final  Blood Culture (routine x 2)     Status: None   Collection Time: 08/08/18  5:18 PM  Result Value Ref Range Status   Specimen Description   Final    BLOOD LEFT ANTECUBITAL Performed at [facility], [address], [place], [place]    Special Requests   Final    BOTTLES DRAWN AEROBIC AND ANAEROBIC Blood Culture adequate volume Performed at [facility], [address] [place], [place]    Culture   Final    NO GROWTH 5 DAYS Performed at [facility], [address], [place], [place]    Report Status 08/13/2018 FINAL  Final  MRSA PCR Screening     Status: None   Collection Time: 08/08/18  8:20 PM  Result Value Ref Range Status   MRSA by PCR NEGATIVE NEGATIVE Final    Comment:        The GeneXpert MRSA Assay (FDA approved for NASAL specimens only), is one component of a comprehensive MRSA colonization surveillance program. It is not intended to diagnose MRSA infection nor to guide or monitor treatment for MRSA infections. Performed at [facility], [address], [place], [place]   Culture, Urine     Status: Abnormal   Collection Time: 08/09/18 11:44 AM  Result Value Ref Range Status   Specimen Description   Final    URINE, RANDOM Performed at [facility], [address] [place], [place]    Special Requests   Final      NONE Performed at [facility], [address] [place], [place] [address]    Culture (A)  Final    20,000 COLONIES/mL MULTIPLE SPECIES PRESENT, SUGGEST RECOLLECTION   Report Status 08/10/2018 FINAL  Final  Culture, Urine     Status: Abnormal   Collection Time: 08/11/18  7:50 AM  Result Value Ref Range Status   Specimen Description   Final    URINE, RANDOM Performed at [facility], [address] [place], [place] [address]    Special Requests   Final    NONE Performed at [facility], [address] [place], [place] [address]    Culture (A)  Final    <10,000 COLONIES/mL INSIGNIFICANT GROWTH Performed at [facility], [address], [place], [place] [address]    Report Status 08/13/2018 FINAL  Final      Studies: No results found.  Scheduled Meds: . cholecalciferol  2,000 Units Oral Daily  . diphenhydrAMINE  25 mg Intravenous Once  . donepezil  5 mg Oral Daily  . enoxaparin (LOVENOX) injection  40 mg Subcutaneous Q24H  . insulin aspart  0-20 Units Subcutaneous TID WC  . insulin aspart  0-5 Units Subcutaneous QHS  . insulin aspart  6 Units Subcutaneous TID WC  . insulin glargine  22 Units Subcutaneous Daily  . ketorolac  15 mg Intravenous Once  . levothyroxine  50 mcg Oral Q0600  . mouth rinse  15 mL Mouth Rinse BID  . metoCLOPramide (REGLAN) injection  10 mg Intravenous Once  . metoprolol succinate  25 mg Oral Daily  . montelukast  10 mg Oral QHS  . multivitamin with minerals  1 tablet Oral Daily  . nystatin-triamcinolone   Topical BID  . oxybutynin  10 mg Oral Daily  . pantoprazole  40 mg Oral Daily  . potassium chloride  40 mEq Oral Once  . simvastatin  20 mg Oral QPM  . umeclidinium bromide  1 puff Inhalation Daily    Continuous Infusions: . sodium chloride       LOS: 6 days     Laverna Peace, MD [facility] Pager [phone]  If 7PM-7AM, please contact  night-coverage [email or web address] Password [facility] 08/15/2018, 10:22 PM

## 2018-08-15 NOTE — Progress Notes (Signed)
Occupational Therapy Treatment Patient Details Name: [name] MRN: [id number] DOB: 11-13-1940 Today's Date: 08/15/2018    History of present illness  [name] is a 77 y.o. female with medical history significant of dementia, type 2 diabetes, asthma, COPD, hypertension, presenting to the hospital via EMS for evaluation of hyperglycemia  with BS 700s and AMS-->dx with DKA   OT comments  Pt declined getting to chair after using BSC .  No sons present.  Follow Up Recommendations  Home health OT;Supervision/Assistance - 24 hour    Equipment Recommendations  3 in 1 bedside commode    Recommendations for Other Services      Precautions / Restrictions Precautions Precautions: Fall       Mobility Bed Mobility Overal bed mobility: Needs Assistance Bed Mobility: Supine to Sit;Sit to Supine     Supine to sit: Supervision Sit to supine: Supervision      Transfers Overall transfer level: Needs assistance Equipment used: Rolling walker (2 wheeled) Transfers: Sit to/from [employer] Sit to Stand: Supervision Stand pivot transfers: Supervision            Balance Overall balance assessment: Needs assistance Sitting-balance support: Feet supported;No upper extremity supported Sitting balance-Leahy Scale: Good     Standing balance support: During functional activity;No upper extremity supported;Single extremity supported   Standing balance comment: for self pericare                            ADL either performed or assessed with clinical judgement   ADL Overall ADL's : Needs assistance/impaired     Grooming: Set up;Sitting               Lower Body Dressing: Moderate assistance;Sit to/from stand;Cueing for safety Lower Body Dressing Details (indicate cue type and reason): may benefit from a sock aid Toilet Transfer: Minimal assistance;BSC;Stand-pivot Toilet Transfer Details (indicate cue type and reason): pt refused  to walk to bathroom but agreed to [facility] Toileting- Clothing Manipulation and Hygiene: Minimal assistance;Cueing for safety;Sit to/from stand         General ADL Comments: Pt declined to get to chair.     Vision Patient Visual Report: No change from baseline            Cognition Arousal/Alertness: Awake/alert Behavior During Therapy: WFL for tasks assessed/performed Overall Cognitive Status: Within Functional Limits for tasks assessed                                                     Pertinent Vitals/ Pain       Pain Assessment: No/denies pain     Prior Functioning/Environment              Frequency  Min 2X/week        Progress Toward Goals  OT Goals(current goals can now be found in the care plan section)  Progress towards OT goals: Progressing toward goals  Acute Rehab OT Goals Patient Stated Goal: to sons OT Goal Formulation: With patient Time For Goal Achievement: 08/21/18 Potential to Achieve Goals: Good  Plan Discharge plan remains appropriate    Co-evaluation                 AM-PAC PT "6 Clicks" Daily Activity     Outcome Measure  Help from another person eating meals?: None Help from another person taking care of personal grooming?: A Little Help from another person toileting, which includes using toliet, bedpan, or urinal?: A Little Help from another person bathing (including washing, rinsing, drying)?: A Little Help from another person to put on and taking off regular upper body clothing?: A Little Help from another person to put on and taking off regular lower body clothing?: A Lot 6 Click Score: 18    End of Session Equipment Utilized During Treatment: Rolling walker;Gait belt  OT Visit Diagnosis: Unsteadiness on feet (R26.81);Other abnormalities of gait and mobility (R26.89);Repeated falls (R29.6);History of falling (Z91.81);Muscle weakness (generalized) (M62.81)   Activity Tolerance Patient tolerated  treatment well   Patient Left in bed;with bed alarm set   Nurse Communication Mobility status        Time: 1400-1415 OT Time Calculation (min): 15 min  Charges: OT General Charges $OT Visit: 1 Visit OT Treatments $Self Care/Home Management : 8-22 mins  Lise Auer, OT Acute Rehabilitation Services [phone] Office- [phone]      Bethania Schlotzhauer, Karin Golden D 08/15/2018, 6:40 PM

## 2018-08-15 NOTE — Care Management Important Message (Signed)
Important Message  Patient Details  Name: [name] MRN: [id number] Date of Birth: March 23, 1941   Medicare Important Message Given:  Yes    Caren Macadam 08/15/2018, 12:09 PMImportant Message  Patient Details  Name: [name] MRN: [id number] Date of Birth: 06-11-1941   Medicare Important Message Given:  Yes    Caren Macadam 08/15/2018, 12:08 PM

## 2018-08-16 ENCOUNTER — Telehealth: Payer: Self-pay | Admitting: Hematology

## 2018-08-16 ENCOUNTER — Encounter: Payer: Self-pay | Admitting: Hematology

## 2018-08-16 LAB — GLUCOSE, CAPILLARY
GLUCOSE-CAPILLARY: 114 mg/dL — AB (ref 70–99)
Glucose-Capillary: 84 mg/dL (ref 70–99)
Glucose-Capillary: 107 mg/dL — ABNORMAL HIGH (ref 70–99)

## 2018-08-16 LAB — CREATININE, SERUM
Creatinine, Ser: 1.17 mg/dL — ABNORMAL HIGH (ref 0.44–1.00)
GFR, EST AFRICAN AMERICAN: 50 mL/min — AB (ref >60)
GFR, EST NON AFRICAN AMERICAN: 43 mL/min — AB (ref >60)

## 2018-08-16 NOTE — Telephone Encounter (Signed)
New referral received from Dr. Marland Mcalpine from the hospital for leukocytosis. [name] has been scheduled to see Dr. Candise Che on 12/12 at 1pm. Letter mailed to the [name].

## 2018-08-16 NOTE — Progress Notes (Signed)
Physical Therapy Treatment Patient Details Name: [name] MRN: [id number] DOB: 1940/11/30 Today's Date: 08/16/2018    History of Present Illness  [name] is a 77 y.o. female with medical history significant of dementia, type 2 diabetes, asthma, COPD, hypertension, presenting to the hospital via EMS for evaluation of hyperglycemia  with BS 700s and AMS-->dx with DKA    PT Comments    Pt agreeable to ambulate since she has only been up in room and supposed to d/c home today.  Pt mildly unsteady however no overt LOB.  Pt reports her family can assist her upon d/c home.    Follow Up Recommendations  Other (comment)(pt to d/c home today, recommend HH since pt prefers home)     Equipment Recommendations  Rolling walker with 5" wheels;3in1 (PT)    Recommendations for Other Services       Precautions / Restrictions Precautions Precautions: Fall    Mobility  Bed Mobility Overal bed mobility: Needs Assistance Bed Mobility: Supine to Sit     Supine to sit: Supervision     General bed mobility comments: increased time  Transfers Overall transfer level: Needs assistance Equipment used: None Transfers: Sit to/from Stand Sit to Stand: Supervision            Ambulation/Gait Ambulation/Gait assistance: Min guard Gait Distance (Feet): 120 Feet Assistive device: None Gait Pattern/deviations: Step-through pattern;Decreased stride length     General Gait Details: pt grazed hand rail and mildly unsteady however no overt LOB observed, min/guard for safety, HR 120 bpm during ambulation, pt required seated rest break due to fatigue after 60 feet.   Stairs             Wheelchair Mobility    Modified Rankin (Stroke Patients Only)       Balance                                            Cognition Arousal/Alertness: Awake/alert Behavior During Therapy: WFL for tasks assessed/performed Overall Cognitive Status: Within  Functional Limits for tasks assessed                                        Exercises      General Comments        Pertinent Vitals/Pain Pain Assessment: Faces Faces Pain Scale: Hurts little more Pain Location: "all joints" from athritis Pain Descriptors / Indicators: Tender Pain Intervention(s): Repositioned;Monitored during session;Limited activity within patient's tolerance    Home Living                      Prior Function            PT Goals (current goals can now be found in the care plan section) Progress towards PT goals: Progressing toward goals    Frequency    Min 3X/week      PT Plan Current plan remains appropriate    Co-evaluation              AM-PAC PT "6 Clicks" Daily Activity  Outcome Measure  Difficulty turning over in bed (including adjusting bedclothes, sheets and blankets)?: A Lot Difficulty moving from lying on back to sitting on the side of the bed? : A Lot Difficulty sitting down on and  standing up from a chair with arms (e.g., wheelchair, bedside commode, etc,.)?: A Lot Help needed moving to and from a bed to chair (including a wheelchair)?: A Little Help needed walking in hospital room?: A Little Help needed climbing 3-5 steps with a railing? : A Little 6 Click Score: 15    End of Session Equipment Utilized During Treatment: Gait belt Activity Tolerance: Patient limited by fatigue Patient left: in chair;with call bell/phone within reach Nurse Communication: Mobility status PT Visit Diagnosis: Difficulty in walking, not elsewhere classified (R26.2);Muscle weakness (generalized) (M62.81)     Time: 7253-6644 PT Time Calculation (min) (ACUTE ONLY): 11 min  Charges:  $Gait Training: 8-22 mins                    Zenovia Jarred, PT, DPT Acute Rehabilitation Services Office: [phone] Pager: [phone]  Sarajane Jews 08/16/2018, 1:43 PM

## 2018-08-17 LAB — GLUCOSE, CAPILLARY: Glucose-Capillary: 122 mg/dL — ABNORMAL HIGH (ref 70–99)

## 2018-09-13 ENCOUNTER — Encounter: Payer: Medicare Other | Admitting: Hematology

## 2018-09-14 ENCOUNTER — Telehealth: Payer: Self-pay | Admitting: Oncology

## 2018-09-14 NOTE — Telephone Encounter (Signed)
Received a call from [facility] at [facility] to reschedule the pt's appt. Pt has been rescheduled to see Dr. Clelia Croft on 1/15 at 2pm. [name] will notify the pt's son of the appt date and time.

## 2018-10-17 ENCOUNTER — Inpatient Hospital Stay: Payer: Medicare Other | Attending: Oncology | Admitting: Oncology

## 2018-10-17 VITALS — BP 130/81 | HR 101 | Temp 98.2°F | Resp 20 | Ht 63.0 in | Wt 169.5 lb

## 2018-10-17 DIAGNOSIS — M1990 Unspecified osteoarthritis, unspecified site: Secondary | ICD-10-CM | POA: Diagnosis not present

## 2018-10-17 DIAGNOSIS — Z794 Long term (current) use of insulin: Secondary | ICD-10-CM

## 2018-10-17 DIAGNOSIS — E119 Type 2 diabetes mellitus without complications: Secondary | ICD-10-CM | POA: Insufficient documentation

## 2018-10-17 DIAGNOSIS — G4730 Sleep apnea, unspecified: Secondary | ICD-10-CM | POA: Diagnosis not present

## 2018-10-17 DIAGNOSIS — M810 Age-related osteoporosis without current pathological fracture: Secondary | ICD-10-CM | POA: Insufficient documentation

## 2018-10-17 DIAGNOSIS — M797 Fibromyalgia: Secondary | ICD-10-CM

## 2018-10-17 DIAGNOSIS — Z87891 Personal history of nicotine dependence: Secondary | ICD-10-CM | POA: Insufficient documentation

## 2018-10-17 DIAGNOSIS — D72828 Other elevated white blood cell count: Secondary | ICD-10-CM | POA: Diagnosis not present

## 2018-10-17 DIAGNOSIS — E785 Hyperlipidemia, unspecified: Secondary | ICD-10-CM | POA: Diagnosis not present

## 2018-10-17 DIAGNOSIS — Z7982 Long term (current) use of aspirin: Secondary | ICD-10-CM | POA: Insufficient documentation

## 2018-10-17 DIAGNOSIS — Z79899 Other long term (current) drug therapy: Secondary | ICD-10-CM | POA: Diagnosis not present

## 2018-10-17 DIAGNOSIS — I10 Essential (primary) hypertension: Secondary | ICD-10-CM | POA: Insufficient documentation

## 2018-10-17 DIAGNOSIS — Z8709 Personal history of other diseases of the respiratory system: Secondary | ICD-10-CM

## 2018-10-17 DIAGNOSIS — J449 Chronic obstructive pulmonary disease, unspecified: Secondary | ICD-10-CM | POA: Insufficient documentation

## 2018-10-17 DIAGNOSIS — D72829 Elevated white blood cell count, unspecified: Secondary | ICD-10-CM

## 2018-10-17 NOTE — Progress Notes (Signed)
Reason for the request:   Leukocytosis  HPI: I was asked by Dr. Alfredia Ferguson to evaluate [name] for leukocytosis.  She is a 78 year old woman with history of COPD, asthma and hypertension among other comorbid conditions.  She was hospitalized between 08/08/2018 and 08/14/2018 for altered mental status and hyperglycemia after she have developed upper respiratory infection.  During hospitalization she was noted to have leukocytosis with normal differential.  Her white cell count on admission was 25,000 with a hemoglobin of 13 and platelet count of 200.  During her hospitalization her white cell count decreased to 14,000 with normal differential.  Looking back at her previous counts showed fluctuation in her white cell count as high as 20,000 in 2008 to normal range in 2010.  She also had normal white cell count in 2013.  Since her discharge, she reports few complaints predominantly arthritic joint pain but no other constitutional symptoms.  She is eating well and denied any fevers or sweats.  He denies any respiratory complaints at this time.   She does not report any headaches, blurry vision, syncope or seizures. Does not report any fevers, chills or sweats.  Does not report any cough, wheezing or hemoptysis.  Does not report any chest pain, palpitation, orthopnea or leg edema.  Does not report any nausea, vomiting or abdominal pain.  Does not report any constipation or diarrhea.  Does not report any skeletal complaints.    Does not report frequency, urgency or hematuria.  Does not report any skin rashes or lesions. Does not report any heat or cold intolerance.  Does not report any lymphadenopathy or petechiae.  Does not report any anxiety or depression.  Remaining review of systems is negative.    Past Medical History:  Diagnosis Date  . Asthma   . Bronchitis   . COPD (chronic obstructive pulmonary disease) ([place])   . Diabetes mellitus   . Fibromyalgia   . Hyperlipidemia   . Hypertension   .  Osteoporosis   . Rheumatoid aortitis   . Sleep apnea   :  Past Surgical History:  Procedure Laterality Date  . JOINT REPLACEMENT     left total knee  . KNEE ARTHROPLASTY    . TOTAL KNEE ARTHROPLASTY    :   Current Outpatient Medications:  .  albuterol (PROVENTIL HFA;VENTOLIN HFA) 108 (90 Base) MCG/ACT inhaler, Inhale 1-2 puffs into the lungs every 6 (six) hours as needed for wheezing or shortness of breath., Disp: , Rfl:  .  aspirin 81 MG chewable tablet, Chew 81 mg by mouth See admin instructions. Monday Wednesday Friday, Disp: , Rfl:  .  blood glucose meter kit and supplies KIT, Dispense based on patient and insurance preference. Use up to four times daily as directed. (FOR ICD-9 250.00, 250.01)., Disp: 1 each, Rfl: 0 .  donepezil (ARICEPT) 5 MG tablet, Take 5 mg by mouth daily., Disp: , Rfl: 3 .  guaifenesin (ROBITUSSIN) 100 MG/5ML syrup, Take 100 mg by mouth daily as needed for cough., Disp: , Rfl:  .  insulin glargine (LANTUS) 100 unit/mL SOPN, Inject 0.2 mLs (20 Units total) into the skin daily., Disp: 15 mL, Rfl: 0 .  Insulin Pen Needle (PEN NEEDLES) 31G X 5 MM MISC, 1 Container by Does not apply route daily., Disp: 100 each, Rfl: 0 .  Lancets (ONETOUCH ULTRASOFT) lancets, , Disp: , Rfl:  .  levothyroxine (SYNTHROID, LEVOTHROID) 50 MCG tablet, Take 50 mcg by mouth daily before breakfast., Disp: , Rfl: 0 .  meclizine (ANTIVERT) 12.5 MG tablet, Take 12.5 mg by mouth 3 (three) times daily as needed for dizziness., Disp: , Rfl:  .  metFORMIN (GLUCOPHAGE-XR) 500 MG 24 hr tablet, Take 250 mg by mouth daily., Disp: , Rfl: 0 .  metoprolol succinate (TOPROL-XL) 25 MG 24 hr tablet, Take 25 mg by mouth daily., Disp: , Rfl: 0 .  montelukast (SINGULAIR) 10 MG tablet, Take 10 mg by mouth daily., Disp: , Rfl:  .  Multiple Vitamin (MULITIVITAMIN WITH MINERALS) TABS, Take 1 tablet by mouth daily., Disp: , Rfl:  .  omeprazole (PRILOSEC) 40 MG capsule, Take 40 mg by mouth daily., Disp: , Rfl: 3 .   ONE TOUCH ULTRA TEST test strip, , Disp: , Rfl:  .  oxybutynin (DITROPAN-XL) 10 MG 24 hr tablet, Take 10 mg by mouth daily. , Disp: , Rfl:  .  RA VITAMIN D-3 2000 units CAPS, Take 1 capsule by mouth daily., Disp: , Rfl: 0 .  simvastatin (ZOCOR) 20 MG tablet, Take 20 mg by mouth every evening., Disp: , Rfl:  .  TAZTIA XT 120 MG 24 hr capsule, Take 120 mg by mouth daily., Disp: , Rfl: 0 .  tiotropium (SPIRIVA) 18 MCG inhalation capsule, Place 18 mcg into inhaler and inhale daily., Disp: , Rfl:  .  traMADol-acetaminophen (ULTRACET) 37.5-325 MG tablet, Take 2 tablets by mouth three times a day if needed, Disp: 180 tablet, Rfl: 0:  Allergies  Allergen Reactions  . Codeine Nausea And Vomiting and Other (See Comments)    Hallucinations   . Darvon [Propoxyphene Hcl]   . Fentanyl Other (See Comments)    Pt reports duragesic patches cause her to hallucinate.   . Oxycodone Hcl Nausea And Vomiting and Other (See Comments)    Hallucinate   . Propoxyphene N-Acetaminophen   . Valium [Diazepam]   . Valsartan   :  Family History  Problem Relation Age of Onset  . Diabetes Mother   . Heart failure Mother   . Cancer Mother   . Heart failure Father   . Cancer Sister   . Cancer Sister   . Cancer Brother   . Multiple sclerosis Son   :  Social History   Socioeconomic History  . Marital status: Divorced    Spouse name: Not on file  . Number of children: Not on file  . Years of education: Not on file  . Highest education level: Not on file  Occupational History  . Not on file  Social Needs  . Financial resource strain: Not on file  . Food insecurity:    Worry: Not on file    Inability: Not on file  . Transportation needs:    Medical: Not on file    Non-medical: Not on file  Tobacco Use  . Smoking status: Former Smoker    Packs/day: 1.00    Years: 30.00    Pack years: 30.00    Types: Cigarettes    Last attempt to quit: 01/11/1996    Years since quitting: 22.7  . Smokeless tobacco:  Never Used  Substance and Sexual Activity  . Alcohol use: Yes    Comment: OCC WINE  . Drug use: No  . Sexual activity: Not on file  Lifestyle  . Physical activity:    Days per week: Not on file    Minutes per session: Not on file  . Stress: Not on file  Relationships  . Social connections:    Talks on phone: Not on file  Gets together: Not on file    Attends religious service: Not on file    Active member of club or organization: Not on file    Attends meetings of clubs or organizations: Not on file    Relationship status: Not on file  . Intimate partner violence:    Fear of current or ex partner: Not on file    Emotionally abused: Not on file    Physically abused: Not on file    Forced sexual activity: Not on file  Other Topics Concern  . Not on file  Social History Narrative  . Not on file  :  Pertinent items are noted in HPI.  Exam: Blood pressure 130/81, pulse (!) 101, temperature 98.2 F (36.8 C), temperature source Oral, resp. rate 20, height _0  (1.6 m), weight 169 lb 8 oz (76.9 kg), SpO2 98 %.   ECOG 1 General appearance: alert and cooperative appeared without distress. Head: atraumatic without any abnormalities. Eyes: conjunctivae/corneas clear. PERRL.  Sclera anicteric. Throat: lips, mucosa, and tongue normal; without oral thrush or ulcers. Resp: clear to auscultation bilaterally without rhonchi, wheezes or dullness to percussion. Cardio: regular rate and rhythm, S1, S2 normal, no murmur, click, rub or gallop GI: soft, non-tender; bowel sounds normal; no masses,  no organomegaly Skin: Skin color, texture, turgor normal. No rashes or lesions Lymph nodes: Cervical, supraclavicular, and axillary nodes normal. Neurologic: Grossly normal without any motor, sensory or deep tendon reflexes. Musculoskeletal: No joint deformity or effusion.  CBC    Component Value Date/Time   WBC 14.7 (H) 08/14/2018 0601   RBC 3.38 (L) 08/14/2018 0601   HGB 9.4 (L)  08/14/2018 0601   HCT 30.2 (L) 08/14/2018 0601   PLT 190 08/14/2018 0601   MCV 89.3 08/14/2018 0601   MCH 27.8 08/14/2018 0601   MCHC 31.1 08/14/2018 0601   RDW 14.9 08/14/2018 0601   LYMPHSABS 4.2 (H) 08/14/2018 0601   MONOABS 1.1 (H) 08/14/2018 0601   EOSABS 0.1 08/14/2018 0601   BASOSABS 0.0 08/14/2018 0601     Chemistry      Component Value Date/Time   NA 142 08/14/2018 0601   K 4.2 08/14/2018 0601   CL 109 08/14/2018 0601   CO2 26 08/14/2018 0601   BUN 9 08/14/2018 0601   CREATININE 1.17 (H) 08/16/2018 0534      Component Value Date/Time   CALCIUM 8.0 (L) 08/14/2018 0601   ALKPHOS 57 08/14/2018 0601   AST 22 08/14/2018 0601   ALT 18 08/14/2018 0601   BILITOT 0.4 08/14/2018 0601       Assessment and Plan:   78 year old woman with the following:  1.  Leukocytosis with normal differential.  This was noted during her recent hospitalization in November 2019.  Her white cell count was as high as 24,000 and declined to 14,000 after her acute illness.  Her white cell count has fluctuated between normal range and to out as high as 20,000 dating back to 2008.  Differential diagnosis was discussed today with the patient and her family.  Reactive leukocytosis is the most likely etiology.  She has a lot of issues with reactive airway disease occluding asthma, bronchitis and COPD and her white cell count elevation has correlated with acute illness consistently.  She also has been on prednisone prior to her recent hospitalization which contributed to her leukocytosis.  Other etiologies such as chronic myelogenous leukemia, lymphoproliferative disorder among others are considered less likely.  From a management standpoint, I see no  further intervention or evaluation needed at this time given the chronicity and fluctuation of her white cell count that is consistent with reactive leukocytosis.  2.  Follow-up: I am happy to see her in the future as needed.  30  minutes was spent with  the patient face-to-face today.  More than 50% of time was dedicated to reviewing laboratory data, discussing differential diagnosis and management options.     Thank you for the referral.  A copy of this consult has been forwarded to the requesting physician.

## 2018-10-19 ENCOUNTER — Telehealth: Payer: Self-pay

## 2018-10-19 NOTE — Telephone Encounter (Signed)
Per 1/15 no los °

## 2018-10-30 NOTE — Progress Notes (Signed)
Office Visit Note  Patient: Katherine GraveMargaret M Velazquez             Date of Birth: 04-16-1941           MRN: [id number]             PCP: Gwenyth Benderean, Eric L, MD Referring: Gwenyth Benderean, Eric L, MD Visit Date: 11/13/2018 Occupation: @GUAROCC @  Subjective:  Right trochanteric bursitis and right knee joint pain   History of Present Illness: [name] is a 78 y.o. female with history of osteoarthritis, fibromyalgia, and DDD.  She presents today with right trochanteric bursitis and right knee joint pain.  She had cortisone injections in both on 07/10/18 that provided significant relief until the last 1 week. She would like injections today. She states the left knee replacement is doing well.  She walks with a cane. She denies any joint swelling.  She denies any hand pain or joint swelling.  She denies any neck or lower back pain currently.  She has generalized muscle aches and muscle tenderness due to fibromyalgia.    Activities of Daily Living:  Patient reports morning stiffness all day.   Patient Denies nocturnal pain.  Difficulty dressing/grooming: Reports Difficulty climbing stairs: Reports Difficulty getting out of chair: Reports Difficulty using hands for taps, buttons, cutlery, and/or writing: Denies  Review of Systems  Constitutional: Positive for fatigue.  HENT: Positive for mouth dryness. Negative for mouth sores and nose dryness.   Eyes: Positive for dryness. Negative for pain and visual disturbance.  Respiratory: Negative for cough, hemoptysis, shortness of breath and difficulty breathing.   Cardiovascular: Negative for chest pain, palpitations, hypertension and swelling in legs/feet.  Gastrointestinal: Negative for blood in stool, constipation and diarrhea.  Endocrine: Negative for increased urination.  Genitourinary: Negative for difficulty urinating and painful urination.  Musculoskeletal: Positive for arthralgias, joint pain, muscle weakness, morning stiffness and muscle tenderness.  Negative for joint swelling, myalgias and myalgias.  Skin: Negative for color change, pallor, rash, hair loss, nodules/bumps, skin tightness, ulcers and sensitivity to sunlight.  Allergic/Immunologic: Negative for susceptible to infections.  Neurological: Negative for dizziness, numbness and headaches.  Hematological: Negative for swollen glands.  Psychiatric/Behavioral: Negative for depressed mood and sleep disturbance. The patient is not nervous/anxious.     PMFS History:  Patient Active Problem List   Diagnosis Date Noted  . DKA (diabetic ketoacidoses) ([facility]) 08/08/2018  . Sepsis ([facility]) 08/08/2018  . AKI (acute kidney injury) ([facility]) 08/08/2018  . Acute metabolic encephalopathy 08/08/2018  . COPD (chronic obstructive pulmonary disease) ([facility]) 08/08/2018  . HTN (hypertension) 08/08/2018  . Dementia ([facility]) 08/08/2018  . Overactive bladder 08/08/2018  . Physical deconditioning 08/08/2018  . History of hyperlipidemia 12/26/2017  . History of atrial fibrillation 12/26/2017  . History of diabetes mellitus 12/26/2017  . History of hypothyroidism 12/26/2017  . History of sleep apnea 12/26/2017  . History of gastroesophageal reflux (GERD) 12/26/2017  . History of asthma 12/26/2017  . On prednisone therapy 05/04/2017  . Chronic left shoulder pain 01/12/2017  . Hx of cardiac cath 12/18/2016  . Chronic pain of right knee 12/07/2016  . Primary osteoarthritis of both hands 12/07/2016  . Chronic right shoulder pain 12/07/2016  . History of osteopenia 12/07/2016  . 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 Atrial fibrillation ([facility]) 10/24/2016  . OSA (obstructive sleep apnea) 05/15/2008  . Hypothyroidism 04/03/2007  . Diabetes mellitus ([facility]) 04/03/2007  . HLD (hyperlipidemia) 04/03/2007  . Asthma 04/03/2007  . GERD 04/03/2007  .  Primary osteoarthritis of right knee 04/03/2007  . DDD (degenerative disc disease), lumbar 04/03/2007  . DDD (degenerative disc disease), cervical  04/03/2007  . Fibromyalgia 04/03/2007  . 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 TACHYCARDIA, HX OF 04/03/2007  . STATUS, KNEE JOINT REPLACEMENT 04/03/2007    Past Medical History:  Diagnosis Date  . Asthma   . Bronchitis   . COPD (chronic obstructive pulmonary disease) ([facility])   . Diabetes mellitus   . Fibromyalgia   . Hyperlipidemia   . Hypertension   . Osteoporosis   . Rheumatoid aortitis   . Sleep apnea     Family History  Problem Relation Age of Onset  . Diabetes Mother   . Heart failure Mother   . Cancer Mother   . Heart failure Father   . Cancer Sister   . Cancer Sister   . Cancer Brother   . Multiple sclerosis Son    Past Surgical History:  Procedure Laterality Date  . JOINT REPLACEMENT     left total knee  . KNEE ARTHROPLASTY    . TOTAL KNEE ARTHROPLASTY     Social History   Social History Narrative  . Not on file   Immunization History  Administered Date(s) Administered  . Influenza-Unspecified 07/03/2013     Objective: Vital Signs: BP 121/82 (BP Location: Left Arm, Patient Position: Sitting, Cuff Size: Large)   Pulse (!) 103   Resp 20   Ht  (1.575 m)   Wt 173 lb 9.6 oz (78.7 kg)   BMI 31.75 kg/m    Physical Exam Vitals signs and nursing note reviewed.  Constitutional:      Appearance: She is well-developed.  HENT:     Head: Normocephalic and atraumatic.  Eyes:     Conjunctiva/sclera: Conjunctivae normal.  Neck:     Musculoskeletal: Normal range of motion.  Cardiovascular:     Rate and Rhythm: Normal rate and regular rhythm.     Heart sounds: Normal heart sounds.  Pulmonary:     Effort: Pulmonary effort is normal.     Breath sounds: Normal breath sounds.  Abdominal:     General: Bowel sounds are normal.     Palpations: Abdomen is soft.  Lymphadenopathy:     Cervical: No cervical adenopathy.  Skin:    General: Skin is warm and dry.     Capillary Refill: Capillary refill takes less than 2 seconds.  Neurological:     Mental Status: She  is alert and oriented to person, place, and time.  Psychiatric:        Behavior: Behavior normal.      Musculoskeletal Exam: Generalized hyperalgesia on exam.  C-spine good ROM.  Thoracic and lumbar spine good ROM.  No midline spinal tenderness.  No SI joint tenderness.  Shoulder joints, elbow joints, wrist joints, MCPs, PIPs,and DIPs good ROM with no synovitis.  Complete fist formation bilaterally.  Hip joints good ROM.  Tenderness over bilateral trochanteric bursa.  Knee joint, ankle joints, MTPs, PIPs, and DIPs good ROM with no synovitis.  No warmth or effusion of knee joints. No tenderness or swelling of ankle joints.  CDAI Exam: CDAI Score: Not documented Patient Global Assessment: Not documented; Provider Global Assessment: Not documented Swollen: Not documented; Tender: Not documented Joint Exam   Not documented   There is currently no information documented on the homunculus. Go to the Rheumatology activity and complete the homunculus joint exam.  Investigation: No additional findings.  Imaging: No results found.  Recent Labs:  Lab Results  Component Value Date   WBC 14.7 (H) 08/14/2018   HGB 9.4 (L) 08/14/2018   PLT 190 08/14/2018   NA 142 08/14/2018   K 4.2 08/14/2018   CL 109 08/14/2018   CO2 26 08/14/2018   GLUCOSE 135 (H) 08/14/2018   BUN 9 08/14/2018   CREATININE 1.17 (H) 08/16/2018   BILITOT 0.4 08/14/2018   ALKPHOS 57 08/14/2018   AST 22 08/14/2018   ALT 18 08/14/2018   PROT 5.2 (L) 08/14/2018   ALBUMIN 2.4 (L) 08/14/2018   CALCIUM 8.0 (L) 08/14/2018   GFRAA 50 (L) 08/16/2018    Speciality Comments: No specialty comments available.  Procedures:  Large Joint Inj: R knee on 11/13/2018 3:57 PM Indications: pain Details: 27 G 1.5 in needle, medial approach  Arthrogram: No  Medications: 1.5 mL lidocaine 1 %; 40 mg triamcinolone acetonide 40 MG/ML Aspirate: 0 mL Outcome: tolerated well, no immediate complications Procedure, treatment alternatives,  risks and benefits explained, specific risks discussed. Consent was given by the patient. Immediately prior to procedure a time out was called to verify the correct patient, procedure, equipment, support staff and site/side marked as required. Patient was prepped and draped in the usual sterile fashion.   Large Joint Inj: R greater trochanter on 11/13/2018 3:57 PM Indications: pain Details: 27 G 1.5 in needle, lateral approach  Arthrogram: No  Medications: 1 mL lidocaine 1 %; 40 mg triamcinolone acetonide 40 MG/ML Aspirate: 0 mL Outcome: tolerated well, no immediate complications Procedure, treatment alternatives, risks and benefits explained, specific risks discussed. Consent was given by the patient. Immediately prior to procedure a time out was called to verify the correct patient, procedure, equipment, support staff and site/side marked as required. Patient was prepped and draped in the usual sterile fashion.     Allergies: Codeine; Darvon [propoxyphene hcl]; Fentanyl; Oxycodone hcl; Propoxyphene n-acetaminophen; Valium [diazepam]; and Valsartan   Assessment / Plan:     Visit Diagnoses: Primary osteoarthritis of both hands - RF+, CCP-, no hx of synovitis: She has PIP and DIP synovial thickening consistent with osteoarthritis of both hands.  She has no synovitis or tenderness on exam.  Joint protection and muscle strengthening were discussed. She was advised to notify us if she develops increased joint pain or joint swelling.  She will follow up in 6 months.   Primary osteoarthritis of right knee: Chronic pain. No warmth or effusion.  She has good ROM with discomfort.  She walks with a cane. She had a right knee cortisone injection on 07/10/18 that provided significant relief up until 1 week ago.  She requested a cortisone injection today.  She tolerated the procedure well.  Procedure note completed above.  She was advised to monitor blood pressure closely following the injection.     Trochanteric bursitis, right hip: She has tenderness on exam today.  She has been have severe pain for the past 1 week.  She had a cortisone injection on 07/10/18.  She requested a cortisone injection today.  She tolerated it well.  Procedure note completed above.   History of total knee replacement, left: No warmth or effusion of knee joints.  Good ROM with no discomfort.  She walks with a cane.   Fibromyalgia: She has generalized hyperalgesia and positive tender points on exam.  She has generalized muscle aches and muscle tenderness.  She has right trochanteric bursitis. She takes Ultracet prn for pain relief. She was encouraged to stay active and exercise on a regular basis. She  continues to have chronic fatigue and interrupted sleep at night due to the pain she has been experiencing.   Medication management - Ultracet 2 tablets TID PRN for pain relief.   DDD (degenerative disc disease), cervical: She has limited ROM.  She has no symptoms of radiculopathy at this time.   DDD (degenerative disc disease), lumbar: She has no discomfort at this time.  She has limited ROM.    Other medical conditions are listed as follows:   Positive TB test  History of sleep apnea  History of diabetes mellitus  History of atrial fibrillation  History of hypothyroidism  History of gastroesophageal reflux (GERD)  History of hyperlipidemia  History of asthma  History of osteopenia   Orders: Orders Placed This Encounter  Procedures  . Large Joint Inj  . Large Joint Inj   No orders of the defined types were placed in this encounter.   Face-to-face time spent with patient was 30 minutes. Greater than 50% of time was spent in counseling and coordination of care.  Follow-Up Instructions: Return in about 6 months (around 05/14/2019) for Osteoarthritis, Fibromyalgia, DDD.   Gearldine Bienenstock, PA-C  Note - This record has been created using Dragon software.  Chart creation errors have been sought,  but may not always  have been located. Such creation errors do not reflect on  the standard of medical care.

## 2018-11-02 ENCOUNTER — Ambulatory Visit: Payer: Medicare Other | Admitting: Podiatry

## 2018-11-13 ENCOUNTER — Encounter: Payer: Self-pay | Admitting: Physician Assistant

## 2018-11-13 ENCOUNTER — Ambulatory Visit (INDEPENDENT_AMBULATORY_CARE_PROVIDER_SITE_OTHER): Payer: Medicare Other | Admitting: Physician Assistant

## 2018-11-13 VITALS — BP 121/82 | HR 103 | Resp 20 | Ht 62.0 in | Wt 173.6 lb

## 2018-11-13 DIAGNOSIS — G8929 Other chronic pain: Secondary | ICD-10-CM | POA: Diagnosis not present

## 2018-11-13 DIAGNOSIS — M7061 Trochanteric bursitis, right hip: Secondary | ICD-10-CM

## 2018-11-13 DIAGNOSIS — Z96652 Presence of left artificial knee joint: Secondary | ICD-10-CM

## 2018-11-13 DIAGNOSIS — M1711 Unilateral primary osteoarthritis, right knee: Secondary | ICD-10-CM | POA: Diagnosis not present

## 2018-11-13 DIAGNOSIS — M797 Fibromyalgia: Secondary | ICD-10-CM

## 2018-11-13 DIAGNOSIS — M19042 Primary osteoarthritis, left hand: Secondary | ICD-10-CM

## 2018-11-13 DIAGNOSIS — Z8669 Personal history of other diseases of the nervous system and sense organs: Secondary | ICD-10-CM

## 2018-11-13 DIAGNOSIS — M25561 Pain in right knee: Secondary | ICD-10-CM

## 2018-11-13 DIAGNOSIS — Z8719 Personal history of other diseases of the digestive system: Secondary | ICD-10-CM

## 2018-11-13 DIAGNOSIS — Z8709 Personal history of other diseases of the respiratory system: Secondary | ICD-10-CM

## 2018-11-13 DIAGNOSIS — M19041 Primary osteoarthritis, right hand: Secondary | ICD-10-CM | POA: Diagnosis not present

## 2018-11-13 DIAGNOSIS — M5136 Other intervertebral disc degeneration, lumbar region: Secondary | ICD-10-CM

## 2018-11-13 DIAGNOSIS — R7611 Nonspecific reaction to tuberculin skin test without active tuberculosis: Secondary | ICD-10-CM

## 2018-11-13 DIAGNOSIS — M5030 Other cervical disc degeneration, unspecified cervical region: Secondary | ICD-10-CM

## 2018-11-13 DIAGNOSIS — Z79899 Other long term (current) drug therapy: Secondary | ICD-10-CM | POA: Diagnosis not present

## 2018-11-13 DIAGNOSIS — Z8639 Personal history of other endocrine, nutritional and metabolic disease: Secondary | ICD-10-CM

## 2018-11-13 DIAGNOSIS — Z8739 Personal history of other diseases of the musculoskeletal system and connective tissue: Secondary | ICD-10-CM

## 2018-11-13 DIAGNOSIS — Z8679 Personal history of other diseases of the circulatory system: Secondary | ICD-10-CM

## 2018-11-14 MED ORDER — TRIAMCINOLONE ACETONIDE 40 MG/ML IJ SUSP
40.0000 mg | INTRAMUSCULAR | Status: AC | PRN
  Administered 2018-11-13: 40 mg via INTRA_ARTICULAR

## 2018-11-14 MED ORDER — LIDOCAINE HCL 1 % IJ SOLN
1.0000 mL | INTRAMUSCULAR | Status: AC | PRN
  Administered 2018-11-13: 1 mL

## 2018-11-14 MED ORDER — LIDOCAINE HCL 1 % IJ SOLN
1.5000 mL | INTRAMUSCULAR | Status: AC | PRN
  Administered 2018-11-13: 1.5 mL

## 2018-11-21 ENCOUNTER — Ambulatory Visit (INDEPENDENT_AMBULATORY_CARE_PROVIDER_SITE_OTHER): Payer: Medicare Other | Admitting: Podiatry

## 2018-11-21 ENCOUNTER — Encounter: Payer: Self-pay | Admitting: Podiatry

## 2018-11-21 DIAGNOSIS — B351 Tinea unguium: Secondary | ICD-10-CM

## 2018-11-21 DIAGNOSIS — M79674 Pain in right toe(s): Secondary | ICD-10-CM | POA: Diagnosis not present

## 2018-11-21 DIAGNOSIS — E1142 Type 2 diabetes mellitus with diabetic polyneuropathy: Secondary | ICD-10-CM

## 2018-11-21 DIAGNOSIS — Q828 Other specified congenital malformations of skin: Secondary | ICD-10-CM | POA: Diagnosis not present

## 2018-11-21 DIAGNOSIS — M79675 Pain in left toe(s): Secondary | ICD-10-CM

## 2018-11-21 NOTE — Progress Notes (Signed)
Complaint:  Visit Type: Patient returns to my office for continued preventative foot care services. Complaint: Patient states" my nails have grown long and thick and become painful to walk and wear shoes" Patient has been diagnosed with DM with no foot complications.  She has a painful callus under the outside ball of right foot. The patient presents for preventative foot care services. No changes to ROS  Podiatric Exam: Vascular: dorsalis pedis and posterior tibial pulses are palpable bilateral. Capillary return is immediate. Temperature gradient is WNL. Skin turgor WNL  Sensorium: Normal Semmes Weinstein monofilament test. Normal tactile sensation bilaterally. Nail Exam: Pt has thick disfigured discolored nails with subungual debris noted bilateral entire nail hallux through fifth toenails Ulcer Exam: There is no evidence of ulcer or pre-ulcerative changes or infection. Orthopedic Exam: Muscle tone and strength are WNL. No limitations in general ROM. No crepitus or effusions noted. Foot type and digits show no abnormalities. Bony prominences are unremarkable. Skin:  Porokeratosis sub 5th right foot.. No infection or ulcers  Diagnosis:  Onychomycosis, , Pain in right toe, pain in left toes Porokeratosis right foot.  Treatment & Plan Procedures and Treatment: Consent by patient was obtained for treatment procedures.   Debridement of mycotic and hypertrophic toenails, 1 through 5 bilateral and clearing of subungual debris. No ulceration, no infection noted. Debride porokeratosis  Right foot Return Visit-Office Procedure: Patient instructed to return to the office for a follow up visit 3 months for continued evaluation and treatment.    Helane Gunther DPM

## 2019-02-20 ENCOUNTER — Ambulatory Visit: Payer: Medicare Other | Admitting: Podiatry

## 2019-04-16 NOTE — Progress Notes (Signed)
Office Visit Note  Patient: [name]             Date of Birth: [birth year]           MRN: [id number]             PCP: Gwenyth Benderean, Eric L, MD Referring: Gwenyth Benderean, Eric L, MD Visit Date: 04/23/2019 Occupation: @GUAROCC @  Subjective:  Right knee joint pain    History of Present Illness: [name] is a 78 y.o. female with history of fibromyalgia, osteoarthritis, and DDD.  She takes tramadol very sparingly as needed for pain relief.  She denies any generalized muscle aches or muscle tenderness due to fibromyalgia at this time.  She presents today with right knee joint pain and right trochanteric bursitis.  She had cortisone injections in the right trochanter bursa and right knee joint on 11/13/2018.  She would like repeat injections today.  She states that this is the only thing that gives her pain relief.  She denies any joint swelling at this time.  She states that she has been sleeping well at night.  Her level of fatigue has been stable.   Activities of Daily Living:  Patient reports morning stiffness for 0 minutes.   Patient Reports nocturnal pain.  Difficulty dressing/grooming: Reports Difficulty climbing stairs: Reports Difficulty getting out of chair: Reports Difficulty using hands for taps, buttons, cutlery, and/or writing: Denies  Review of Systems  Constitutional: Positive for fatigue.  HENT: Negative for mouth sores, mouth dryness and nose dryness.   Eyes: Negative for pain, itching, visual disturbance and dryness.  Respiratory: Negative for cough, hemoptysis, shortness of breath, wheezing and difficulty breathing.   Cardiovascular: Negative for chest pain, palpitations, hypertension and swelling in legs/feet.  Gastrointestinal: Negative for abdominal pain, blood in stool, constipation and diarrhea.  Endocrine: Negative for increased urination.  Genitourinary: Negative for painful urination and pelvic pain.  Musculoskeletal: Positive for arthralgias, joint pain and  joint swelling. Negative for myalgias, muscle weakness, morning stiffness, muscle tenderness and myalgias.  Skin: Positive for rash. Negative for color change, pallor, hair loss, nodules/bumps, skin tightness, ulcers and sensitivity to sunlight.  Allergic/Immunologic: Negative for susceptible to infections.  Neurological: Negative for dizziness, numbness and headaches.  Hematological: Negative for swollen glands.  Psychiatric/Behavioral: Negative for depressed mood and sleep disturbance. The patient is not nervous/anxious.     PMFS History:  Patient Active Problem List   Diagnosis Date Noted  . DKA (diabetic ketoacidoses) (HCC) 08/08/2018  . Sepsis (HCC) 08/08/2018  . AKI (acute kidney injury) (HCC) 08/08/2018  . Acute metabolic encephalopathy 08/08/2018  . COPD (chronic obstructive pulmonary disease) (HCC) 08/08/2018  . HTN (hypertension) 08/08/2018  . Dementia (HCC) 08/08/2018  . Overactive bladder 08/08/2018  . Physical deconditioning 08/08/2018  . History of hyperlipidemia 12/26/2017  . History of atrial fibrillation 12/26/2017  . History of diabetes mellitus 12/26/2017  . History of hypothyroidism 12/26/2017  . History of sleep apnea 12/26/2017  . History of gastroesophageal reflux (GERD) 12/26/2017  . History of asthma 12/26/2017  . On prednisone therapy 05/04/2017  . Chronic left shoulder pain 01/12/2017  . Hx of cardiac cath 12/18/2016  . Chronic pain of right knee 12/07/2016  . Primary osteoarthritis of both hands 12/07/2016  . Chronic right shoulder pain 12/07/2016  . History of osteopenia 12/07/2016  . 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 Atrial fibrillation (HCC) 10/24/2016  . OSA (obstructive sleep apnea) 05/15/2008  . Hypothyroidism 04/03/2007  . Diabetes mellitus (  HCC) 04/03/2007  . HLD (hyperlipidemia) 04/03/2007  . Asthma 04/03/2007  . GERD 04/03/2007  . Primary osteoarthritis of right knee 04/03/2007  . DDD (degenerative disc disease),  lumbar 04/03/2007  . DDD (degenerative disc disease), cervical 04/03/2007  . Fibromyalgia 04/03/2007  . 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 TACHYCARDIA, HX OF 04/03/2007  . STATUS, KNEE JOINT REPLACEMENT 04/03/2007    Past Medical History:  Diagnosis Date  . Asthma   . Bronchitis   . COPD (chronic obstructive pulmonary disease) (HCC)   . Diabetes mellitus   . Fibromyalgia   . Hyperlipidemia   . Hypertension   . Osteoporosis   . Rheumatoid aortitis   . Sleep apnea     Family History  Problem Relation Age of Onset  . Diabetes Mother   . Heart failure Mother   . Cancer Mother   . Heart failure Father   . Cancer Sister   . Cancer Sister   . Cancer Brother   . Multiple sclerosis Son    Past Surgical History:  Procedure Laterality Date  . JOINT REPLACEMENT     left total knee  . KNEE ARTHROPLASTY    . TOTAL KNEE ARTHROPLASTY     Social History   Social History Narrative  . Not on file   Immunization History  Administered Date(s) Administered  . Influenza-Unspecified 07/03/2013     Objective: Vital Signs: BP 124/67 (BP Location: Right Wrist, Patient Position: Sitting, Cuff Size: Normal)   Pulse 95   Resp 15   Ht 5' 2.5" (1.588 m)   Wt 175 lb 12.8 oz (79.7 kg)   BMI 31.64 kg/m    Physical Exam Vitals signs and nursing note reviewed.  Constitutional:      Appearance: She is well-developed.  HENT:     Head: Normocephalic and atraumatic.  Eyes:     Conjunctiva/sclera: Conjunctivae normal.  Neck:     Musculoskeletal: Normal range of motion.  Cardiovascular:     Rate and Rhythm: Normal rate and regular rhythm.     Heart sounds: Normal heart sounds.  Pulmonary:     Effort: Pulmonary effort is normal.     Breath sounds: Normal breath sounds.  Abdominal:     General: Bowel sounds are normal.     Palpations: Abdomen is soft.  Lymphadenopathy:     Cervical: No cervical adenopathy.  Skin:    General: Skin is warm and dry.     Capillary Refill: Capillary refill  takes less than 2 seconds.  Neurological:     Mental Status: She is alert and oriented to person, place, and time.  Psychiatric:        Behavior: Behavior normal.      Musculoskeletal Exam: She has generalized hyperalgesia and positive tender points.  C-spine, thoracic spine, lumbar spine slightly limited range of motion with discomfort.  Shoulder joints have full range of motion with some discomfort bilaterally.  Elbow joints, wrist joints, MCPs, PIPs and DIPs good range of motion with no synovitis.  Hip joints have good range of motion with some discomfort bilaterally. She has tenderness over the right trochanteric bursa.  Right knee valgus deformity with mild warmth.  Left knee replacement is good range of motion with no warmth or effusion.  She has pedal edema bilaterally.  CDAI Exam: CDAI Score: - Patient Global: -; Provider Global: - Swollen: -; Tender: - Joint Exam   No joint exam has been documented for this visit   There is currently no information  documented on the homunculus. Go to the Rheumatology activity and complete the homunculus joint exam.  Investigation: No additional findings.  Imaging: No results found.  Recent Labs: Lab Results  Component Value Date   WBC 14.7 (H) 08/14/2018   HGB 9.4 (L) 08/14/2018   PLT 190 08/14/2018   NA 142 08/14/2018   K 4.2 08/14/2018   CL 109 08/14/2018   CO2 26 08/14/2018   GLUCOSE 135 (H) 08/14/2018   BUN 9 08/14/2018   CREATININE 1.17 (H) 08/16/2018   BILITOT 0.4 08/14/2018   ALKPHOS 57 08/14/2018   AST 22 08/14/2018   ALT 18 08/14/2018   PROT 5.2 (L) 08/14/2018   ALBUMIN 2.4 (L) 08/14/2018   CALCIUM 8.0 (L) 08/14/2018   GFRAA 50 (L) 08/16/2018    Speciality Comments: No specialty comments available.  Procedures:  Large Joint Inj: R knee on 04/23/2019 2:40 PM Indications: pain Details: 27 G 1.5 in needle, medial approach  Arthrogram: No  Medications: 1.5 mL lidocaine 1 %; 40 mg triamcinolone acetonide 40 MG/ML  Aspirate: 0 mL Outcome: tolerated well, no immediate complications Procedure, treatment alternatives, risks and benefits explained, specific risks discussed. Consent was given by the patient. Immediately prior to procedure a time out was called to verify the correct patient, procedure, equipment, support staff and site/side marked as required. Patient was prepped and draped in the usual sterile fashion.   Large Joint Inj: R greater trochanter on 04/23/2019 2:40 PM Indications: pain Details: 27 G 1.5 in needle, lateral approach  Arthrogram: No  Medications: 40 mg triamcinolone acetonide 40 MG/ML; 1.5 mL lidocaine 1 % Aspirate: 0 mL Outcome: tolerated well, no immediate complications Procedure, treatment alternatives, risks and benefits explained, specific risks discussed. Consent was given by the patient. Immediately prior to procedure a time out was called to verify the correct patient, procedure, equipment, support staff and site/side marked as required. Patient was prepped and draped in the usual sterile fashion.     Allergies: Codeine, Darvon [propoxyphene hcl], Fentanyl, Oxycodone hcl, Propoxyphene n-acetaminophen, Valium [diazepam], and Valsartan   Assessment / Plan:     Visit Diagnoses: Primary osteoarthritis of both hands - RF+, CCP-, no hx of synovitis: She has mild PIP and DIP synovial thickening consistent with osteoarthritis of bilateral hands.  She has no synovitis or tenderness on exam today.  She has complete fist formation bilaterally.  Joint protection and muscle strengthening were discussed.  Primary osteoarthritis of right knee -Valgus deformity and limited extension.  Mild warmth noted but no obvious effusion.  She has chronic right knee joint pain.  She has pain with range of motion.  She continues to walk with a cane.  She has not had any recent injuries or falls.  She had a cortisone injection in the right knee joint on 11/13/2018 which provided significant pain relief but  is since worn off.  She requested a right knee joint cortisone injection today.  She tolerated the procedure well.  History of total knee replacement, left - Doing well.  She has good ROM with no discomfort. No warmth or effusion noted.   Trochanteric bursitis, right hip: She has tenderness over the right trochanteric bursa on exam.  She has difficulty laying on her side at night due to discomfort she experiences.  She requested a right trochanteric bursa cortisone injection.  She tolerated the procedure well.   Fibromyalgia - She has generalized hyperalgesia and positive tender points on exam.  Overall her fibromyalgia has been well controlled.  She  does have trapezius muscle tension and muscle tenderness bilaterally.  She is been experiencing some muscular pain in her lower back as well.  We discussed using salon pause patches or Biofreeze topically as needed for pain relief.  She takes Ultracet very sparingly for pain relief.  She has been sleeping well at night.  Her level fatigue has been stable.  DDD (degenerative disc disease), cervical -she has slightly limited range of motion with discomfort.  She has no symptoms of radiculopathy.  She experiences trapezius muscle tension and muscle tenderness bilaterally.  DDD (degenerative disc disease), lumbar -She experiences intermittent lower back pain.  She has no symptoms of radiculopathy at this time.  She was encouraged to use Biofreeze or salon pause patches.  We also discussed using a heating pad for pain relief.  She takes Ultracet very sparingly for pain relief.  Medication management - Ultracet 2 tablets TID PRN for pain relief.  She takes Ultracet very sparingly.  UDS and narcotic agreement were updated today on 04/23/19. - Plan: Pain Mgmt, Profile 5 w/Conf, U, Pain Mgmt, Tramadol w/medMATCH, U  Other medical conditions are listed as follows:  Positive TB test   History of osteopenia   History of atrial fibrillation   History of  gastroesophageal reflux (GERD)  History of diabetes mellitus  History of hypothyroidism   History of asthma   History of hyperlipidemia   History of sleep apnea   Orders: Orders Placed This Encounter  Procedures  . Large Joint Inj  . Large Joint Inj  . Pain Mgmt, Profile 5 w/Conf, U  . Pain Mgmt, Tramadol w/medMATCH, U   No orders of the defined types were placed in this encounter.   Face-to-face time spent with patient was 30 minutes. Greater than 50% of time was spent in counseling and coordination of care.  Follow-Up Instructions: Return in about 6 months (around 10/24/2019) for Fibromyalgia, Osteoarthritis, DDD.   Gearldine Bienenstock, PA-C  I examined and evaluated the patient with Sherron Ales PA.  Patient continues to have some discomfort due to underlying osteoarthritis.  She had no synovitis on examination.  Her right knee joint and right trochanteric bursa was injected per her request.  She tolerated the procedure well.  She continues to use tramadol for pain management.  She is a lot of discomfort and is unable to function without the use of tramadol.  The plan of care was discussed as noted above.  Pollyann Savoy, MD Note - This record has been created using Animal nutritionist.  Chart creation errors have been sought, but may not always  have been located. Such creation errors do not reflect on  the standard of medical care.

## 2019-04-23 ENCOUNTER — Encounter: Payer: Self-pay | Admitting: Rheumatology

## 2019-04-23 ENCOUNTER — Ambulatory Visit (INDEPENDENT_AMBULATORY_CARE_PROVIDER_SITE_OTHER): Payer: Medicare Other | Admitting: Rheumatology

## 2019-04-23 ENCOUNTER — Other Ambulatory Visit: Payer: Self-pay

## 2019-04-23 VITALS — BP 124/67 | HR 95 | Resp 15 | Ht 62.5 in | Wt 175.8 lb

## 2019-04-23 DIAGNOSIS — M1711 Unilateral primary osteoarthritis, right knee: Secondary | ICD-10-CM

## 2019-04-23 DIAGNOSIS — M5030 Other cervical disc degeneration, unspecified cervical region: Secondary | ICD-10-CM

## 2019-04-23 DIAGNOSIS — R7611 Nonspecific reaction to tuberculin skin test without active tuberculosis: Secondary | ICD-10-CM

## 2019-04-23 DIAGNOSIS — Z96652 Presence of left artificial knee joint: Secondary | ICD-10-CM

## 2019-04-23 DIAGNOSIS — M7061 Trochanteric bursitis, right hip: Secondary | ICD-10-CM

## 2019-04-23 DIAGNOSIS — M19041 Primary osteoarthritis, right hand: Secondary | ICD-10-CM | POA: Diagnosis not present

## 2019-04-23 DIAGNOSIS — Z8739 Personal history of other diseases of the musculoskeletal system and connective tissue: Secondary | ICD-10-CM

## 2019-04-23 DIAGNOSIS — Z8669 Personal history of other diseases of the nervous system and sense organs: Secondary | ICD-10-CM

## 2019-04-23 DIAGNOSIS — Z8639 Personal history of other endocrine, nutritional and metabolic disease: Secondary | ICD-10-CM

## 2019-04-23 DIAGNOSIS — M19042 Primary osteoarthritis, left hand: Secondary | ICD-10-CM

## 2019-04-23 DIAGNOSIS — Z8709 Personal history of other diseases of the respiratory system: Secondary | ICD-10-CM

## 2019-04-23 DIAGNOSIS — Z8719 Personal history of other diseases of the digestive system: Secondary | ICD-10-CM

## 2019-04-23 DIAGNOSIS — Z8679 Personal history of other diseases of the circulatory system: Secondary | ICD-10-CM

## 2019-04-23 DIAGNOSIS — M5136 Other intervertebral disc degeneration, lumbar region: Secondary | ICD-10-CM

## 2019-04-23 DIAGNOSIS — M797 Fibromyalgia: Secondary | ICD-10-CM

## 2019-04-23 DIAGNOSIS — Z79899 Other long term (current) drug therapy: Secondary | ICD-10-CM

## 2019-04-23 MED ORDER — TRIAMCINOLONE ACETONIDE 40 MG/ML IJ SUSP
40.0000 mg | INTRAMUSCULAR | Status: AC | PRN
  Administered 2019-04-23: 40 mg via INTRA_ARTICULAR

## 2019-04-23 MED ORDER — LIDOCAINE HCL 1 % IJ SOLN
1.5000 mL | INTRAMUSCULAR | Status: AC | PRN
  Administered 2019-04-23: 1.5 mL

## 2019-05-14 ENCOUNTER — Ambulatory Visit: Payer: Self-pay | Admitting: Physician Assistant

## 2019-07-16 NOTE — Progress Notes (Deleted)
Office Visit Note  Patient: [name]             Date of Birth: 06/23/1941           MRN: [id number]             PCP: Rogers Blocker, MD Referring: Rogers Blocker, MD Visit Date: 07/17/2019 Occupation: @GUAROCC @  Subjective:  No chief complaint on file.   History of Present Illness: [name] is a 78 y.o. female ***   Activities of Daily Living:  Patient reports morning stiffness for *** {minute/hour:19697}.   Patient {ACTIONS;DENIES/REPORTS:21021675::"Denies"} nocturnal pain.  Difficulty dressing/grooming: {ACTIONS;DENIES/REPORTS:21021675::"Denies"} Difficulty climbing stairs: {ACTIONS;DENIES/REPORTS:21021675::"Denies"} Difficulty getting out of chair: {ACTIONS;DENIES/REPORTS:21021675::"Denies"} Difficulty using hands for taps, buttons, cutlery, and/or writing: {ACTIONS;DENIES/REPORTS:21021675::"Denies"}  No Rheumatology ROS completed.   PMFS History:  Patient Active Problem List   Diagnosis Date Noted  . DKA (diabetic ketoacidoses) ([place]) 08/08/2018  . Sepsis ([place]) 08/08/2018  . AKI (acute kidney injury) ([place]) 08/08/2018  . Acute metabolic encephalopathy 40/05/6760  . COPD (chronic obstructive pulmonary disease) ([place]) 08/08/2018  . HTN (hypertension) 08/08/2018  . Dementia ([place]) 08/08/2018  . Overactive bladder 08/08/2018  . Physical deconditioning 08/08/2018  . History of hyperlipidemia 12/26/2017  . History of atrial fibrillation 12/26/2017  . History of diabetes mellitus 12/26/2017  . History of hypothyroidism 12/26/2017  . History of sleep apnea 12/26/2017  . History of gastroesophageal reflux (GERD) 12/26/2017  . History of asthma 12/26/2017  . On prednisone therapy 05/04/2017  . Chronic left shoulder pain 01/12/2017  . Hx of cardiac cath 12/18/2016  . Chronic pain of right knee 12/07/2016  . Primary osteoarthritis of both hands 12/07/2016  . Chronic right shoulder pain 12/07/2016  . History of osteopenia 12/07/2016  . History of total knee  replacement, left 12/07/2016  . Atrial fibrillation ([place]) 10/24/2016  . OSA (obstructive sleep apnea) 05/15/2008  . Hypothyroidism 04/03/2007  . Diabetes mellitus ([place]) 04/03/2007  . HLD (hyperlipidemia) 04/03/2007  . Asthma 04/03/2007  . GERD 04/03/2007  . Primary osteoarthritis of right knee 04/03/2007  . DDD (degenerative disc disease), lumbar 04/03/2007  . DDD (degenerative disc disease), cervical 04/03/2007  . Fibromyalgia 04/03/2007  . 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 TACHYCARDIA, HX OF 04/03/2007  . STATUS, KNEE JOINT REPLACEMENT 04/03/2007    Past Medical History:  Diagnosis Date  . Asthma   . Bronchitis   . COPD (chronic obstructive pulmonary disease) ([place])   . Diabetes mellitus   . Fibromyalgia   . Hyperlipidemia   . Hypertension   . Osteoporosis   . Rheumatoid aortitis   . Sleep apnea     Family History  Problem Relation Age of Onset  . Diabetes Mother   . Heart failure Mother   . Cancer Mother   . Heart failure Father   . Cancer Sister   . Cancer Sister   . Cancer Brother   . Multiple sclerosis Son    Past Surgical History:  Procedure Laterality Date  . JOINT REPLACEMENT     left total knee  . KNEE ARTHROPLASTY    . TOTAL KNEE ARTHROPLASTY     Social History   Social History Narrative  . Not on file   Immunization History  Administered Date(s) Administered  . Influenza-Unspecified 07/03/2013     Objective: Vital Signs: There were no vitals taken for this visit.   Physical Exam   Musculoskeletal Exam: ***  CDAI Exam: CDAI Score: - Patient Global: -; Provider Global: -  Swollen: -; Tender: - Joint Exam   No joint exam has been documented for this visit   There is currently no information documented on the homunculus. Go to the Rheumatology activity and complete the homunculus joint exam.  Investigation: No additional findings.  Imaging: No results found.  Recent Labs: Lab Results  Component Value Date   WBC 14.7 (H) 08/14/2018    HGB 9.4 (L) 08/14/2018   PLT 190 08/14/2018   NA 142 08/14/2018   K 4.2 08/14/2018   CL 109 08/14/2018   CO2 26 08/14/2018   GLUCOSE 135 (H) 08/14/2018   BUN 9 08/14/2018   CREATININE 1.17 (H) 08/16/2018   BILITOT 0.4 08/14/2018   ALKPHOS 57 08/14/2018   AST 22 08/14/2018   ALT 18 08/14/2018   PROT 5.2 (L) 08/14/2018   ALBUMIN 2.4 (L) 08/14/2018   CALCIUM 8.0 (L) 08/14/2018   GFRAA 50 (L) 08/16/2018    Speciality Comments: No specialty comments available.  Procedures:  No procedures performed Allergies: Codeine, Darvon [propoxyphene hcl], Fentanyl, Oxycodone hcl, Propoxyphene n-acetaminophen, Valium [diazepam], and Valsartan   Assessment / Plan:     Visit Diagnoses: No diagnosis found.  Orders: No orders of the defined types were placed in this encounter.  No orders of the defined types were placed in this encounter.   Face-to-face time spent with patient was *** minutes. Greater than 50% of time was spent in counseling and coordination of care.  Follow-Up Instructions: No follow-ups on file.   Gearldine Bienenstock, PA-C  Note - This record has been created using Dragon software.  Chart creation errors have been sought, but may not always  have been located. Such creation errors do not reflect on  the standard of medical care.

## 2019-07-17 ENCOUNTER — Ambulatory Visit: Payer: Medicare Other | Admitting: Rheumatology

## 2019-07-24 ENCOUNTER — Ambulatory Visit (INDEPENDENT_AMBULATORY_CARE_PROVIDER_SITE_OTHER): Payer: Medicare Other | Admitting: Rheumatology

## 2019-07-24 ENCOUNTER — Encounter: Payer: Self-pay | Admitting: Rheumatology

## 2019-07-24 ENCOUNTER — Other Ambulatory Visit: Payer: Self-pay

## 2019-07-24 VITALS — BP 114/78 | HR 99 | Resp 16 | Ht 62.5 in | Wt 180.8 lb

## 2019-07-24 DIAGNOSIS — Z96652 Presence of left artificial knee joint: Secondary | ICD-10-CM | POA: Diagnosis not present

## 2019-07-24 DIAGNOSIS — Z8679 Personal history of other diseases of the circulatory system: Secondary | ICD-10-CM

## 2019-07-24 DIAGNOSIS — M51369 Other intervertebral disc degeneration, lumbar region without mention of lumbar back pain or lower extremity pain: Secondary | ICD-10-CM

## 2019-07-24 DIAGNOSIS — Z8739 Personal history of other diseases of the musculoskeletal system and connective tissue: Secondary | ICD-10-CM

## 2019-07-24 DIAGNOSIS — M5030 Other cervical disc degeneration, unspecified cervical region: Secondary | ICD-10-CM | POA: Diagnosis not present

## 2019-07-24 DIAGNOSIS — M1711 Unilateral primary osteoarthritis, right knee: Secondary | ICD-10-CM | POA: Diagnosis not present

## 2019-07-24 DIAGNOSIS — Z79899 Other long term (current) drug therapy: Secondary | ICD-10-CM

## 2019-07-24 DIAGNOSIS — M5136 Other intervertebral disc degeneration, lumbar region: Secondary | ICD-10-CM

## 2019-07-24 DIAGNOSIS — Z8709 Personal history of other diseases of the respiratory system: Secondary | ICD-10-CM

## 2019-07-24 DIAGNOSIS — M19041 Primary osteoarthritis, right hand: Secondary | ICD-10-CM | POA: Diagnosis not present

## 2019-07-24 DIAGNOSIS — Z8669 Personal history of other diseases of the nervous system and sense organs: Secondary | ICD-10-CM

## 2019-07-24 DIAGNOSIS — M797 Fibromyalgia: Secondary | ICD-10-CM | POA: Diagnosis not present

## 2019-07-24 DIAGNOSIS — Z8639 Personal history of other endocrine, nutritional and metabolic disease: Secondary | ICD-10-CM

## 2019-07-24 DIAGNOSIS — M7061 Trochanteric bursitis, right hip: Secondary | ICD-10-CM

## 2019-07-24 DIAGNOSIS — Z8719 Personal history of other diseases of the digestive system: Secondary | ICD-10-CM

## 2019-07-24 DIAGNOSIS — M19042 Primary osteoarthritis, left hand: Secondary | ICD-10-CM

## 2019-07-24 DIAGNOSIS — R7611 Nonspecific reaction to tuberculin skin test without active tuberculosis: Secondary | ICD-10-CM

## 2019-07-24 MED ORDER — TRIAMCINOLONE ACETONIDE 40 MG/ML IJ SUSP
40.0000 mg | INTRAMUSCULAR | Status: AC | PRN
  Administered 2019-07-24: 40 mg via INTRA_ARTICULAR

## 2019-07-24 MED ORDER — LIDOCAINE HCL 1 % IJ SOLN
1.5000 mL | INTRAMUSCULAR | Status: AC | PRN
  Administered 2019-07-24: 1.5 mL

## 2019-07-24 NOTE — Progress Notes (Signed)
Office Visit Note  Patient: [name]             Date of Birth: 1941-08-20           MRN: [id number]             PCP: Gwenyth Benderean, Eric L, MD Referring: Gwenyth Benderean, Eric L, MD Visit Date: 07/24/2019 Occupation: @GUAROCC @  Subjective:  Right knee joint pain and right trochanteric bursitis   History of Present Illness: [name] is a 78 y.o. female with history of fibromyalgia and osteoarthritis.  Patient presents today with trochanter bursitis bilaterally.  She is also experiencing increased right knee joint pain.  She denies any joint swelling.  She continues to walk with a cane to assist with ambulation.  She would like a right knee joint and right trochanteric bursitis cortisone injection today.  She states that her left knee replacement is doing well does not have any discomfort at this time.  She states that her fibromyalgia pain has been manageable.  She continues to have chronic fatigue and some difficulty sleeping at night due to discomfort she experiences.   Activities of Daily Living:  Patient reports morning stiffness for 0  minutes.   Patient Reports nocturnal pain.  Difficulty dressing/grooming: Denies Difficulty climbing stairs: Reports Difficulty getting out of chair: Reports Difficulty using hands for taps, buttons, cutlery, and/or writing: Denies  Review of Systems  Constitutional: Positive for fatigue.  HENT: Negative for mouth sores, mouth dryness and nose dryness.   Eyes: Negative for pain, visual disturbance and dryness.  Respiratory: Negative for cough, hemoptysis, shortness of breath and difficulty breathing.   Cardiovascular: Negative for chest pain, palpitations, hypertension and swelling in legs/feet.  Gastrointestinal: Negative for blood in stool, constipation and diarrhea.  Endocrine: Negative for increased urination.  Genitourinary: Negative for painful urination.  Musculoskeletal: Positive for arthralgias and joint pain. Negative for joint  swelling, myalgias, muscle weakness, morning stiffness, muscle tenderness and myalgias.  Skin: Negative for color change, pallor, rash, hair loss, nodules/bumps, skin tightness, ulcers and sensitivity to sunlight.  Allergic/Immunologic: Negative for susceptible to infections.  Neurological: Negative for dizziness, numbness, headaches and weakness.  Hematological: Negative for swollen glands.  Psychiatric/Behavioral: Negative for depressed mood and sleep disturbance. The patient is not nervous/anxious.     PMFS History:  Patient Active Problem List   Diagnosis Date Noted  . DKA (diabetic ketoacidoses) (HCC) 08/08/2018  . Sepsis (HCC) 08/08/2018  . AKI (acute kidney injury) (HCC) 08/08/2018  . Acute metabolic encephalopathy 08/08/2018  . COPD (chronic obstructive pulmonary disease) (HCC) 08/08/2018  . HTN (hypertension) 08/08/2018  . Dementia (HCC) 08/08/2018  . Overactive bladder 08/08/2018  . Physical deconditioning 08/08/2018  . History of hyperlipidemia 12/26/2017  . History of atrial fibrillation 12/26/2017  . History of diabetes mellitus 12/26/2017  . History of hypothyroidism 12/26/2017  . History of sleep apnea 12/26/2017  . History of gastroesophageal reflux (GERD) 12/26/2017  . History of asthma 12/26/2017  . On prednisone therapy 05/04/2017  . Chronic left shoulder pain 01/12/2017  . Hx of cardiac cath 12/18/2016  . Chronic pain of right knee 12/07/2016  . Primary osteoarthritis of both hands 12/07/2016  . Chronic right shoulder pain 12/07/2016  . History of osteopenia 12/07/2016  . 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 Atrial fibrillation (HCC) 10/24/2016  . OSA (obstructive sleep apnea) 05/15/2008  . Hypothyroidism 04/03/2007  . Diabetes mellitus (HCC) 04/03/2007  . HLD (hyperlipidemia) 04/03/2007  . Asthma 04/03/2007  .  GERD 04/03/2007  . Primary osteoarthritis of right knee 04/03/2007  . DDD (degenerative disc disease), lumbar 04/03/2007  . DDD  (degenerative disc disease), cervical 04/03/2007  . Fibromyalgia 04/03/2007  . 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 TACHYCARDIA, HX OF 04/03/2007  . STATUS, KNEE JOINT REPLACEMENT 04/03/2007    Past Medical History:  Diagnosis Date  . Asthma   . Bronchitis   . COPD (chronic obstructive pulmonary disease) (HCC)   . Diabetes mellitus   . Fibromyalgia   . Hyperlipidemia   . Hypertension   . Osteoporosis   . Rheumatoid aortitis   . Sleep apnea     Family History  Problem Relation Age of Onset  . Diabetes Mother   . Heart failure Mother   . Cancer Mother   . Heart failure Father   . Cancer Sister   . Cancer Sister   . Cancer Brother   . Multiple sclerosis Son    Past Surgical History:  Procedure Laterality Date  . JOINT REPLACEMENT     left total knee  . KNEE ARTHROPLASTY    . TOTAL KNEE ARTHROPLASTY     Social History   Social History Narrative  . Not on file   Immunization History  Administered Date(s) Administered  . Influenza-Unspecified 07/03/2013     Objective: Vital Signs: BP 114/78 (BP Location: Left Arm, Patient Position: Sitting, Cuff Size: Normal)   Pulse 99   Resp 16   Ht 5' 2.5" (1.588 m)   Wt 180 lb 12.8 oz (82 kg)   BMI 32.54 kg/m    Physical Exam Vitals signs and nursing note reviewed.  Constitutional:      Appearance: She is well-developed.  HENT:     Head: Normocephalic and atraumatic.  Eyes:     Conjunctiva/sclera: Conjunctivae normal.  Neck:     Musculoskeletal: Normal range of motion.  Cardiovascular:     Rate and Rhythm: Normal rate and regular rhythm.     Heart sounds: Normal heart sounds.  Pulmonary:     Effort: Pulmonary effort is normal.     Breath sounds: Normal breath sounds.  Abdominal:     General: Bowel sounds are normal.     Palpations: Abdomen is soft.  Lymphadenopathy:     Cervical: No cervical adenopathy.  Skin:    General: Skin is warm and dry.     Capillary Refill: Capillary refill takes less than 2 seconds.   Neurological:     Mental Status: She is alert and oriented to person, place, and time.  Psychiatric:        Behavior: Behavior normal.      Musculoskeletal Exam: C-spine good ROM.  Thoracic kyphosis noted.  No midline spinal tenderness.  No SI joint tenderness.  Shoulder joints good ROM with some discomfort in the right shoulder joint.  Elbow joints, wrist joints, MCPs, PIPs, and DIPs good ROM with no synovitis.  Hip joints good ROM.  Tenderness over bilateral trochanteric bursitis.  Right knee has good ROM with discomfort.  No warmth or effusion noted.  Left knee replacement has good ROM with no discomfort.  No tenderness or swelling of ankle joints.   CDAI Exam: CDAI Score: - Patient Global: -; Provider Global: - Swollen: -; Tender: - Joint Exam   No joint exam has been documented for this visit   There is currently no information documented on the homunculus. Go to the Rheumatology activity and complete the homunculus joint exam.  Investigation: No additional findings.  Imaging:  No results found.  Recent Labs: Lab Results  Component Value Date   WBC 14.7 (H) 08/14/2018   HGB 9.4 (L) 08/14/2018   PLT 190 08/14/2018   NA 142 08/14/2018   K 4.2 08/14/2018   CL 109 08/14/2018   CO2 26 08/14/2018   GLUCOSE 135 (H) 08/14/2018   BUN 9 08/14/2018   CREATININE 1.17 (H) 08/16/2018   BILITOT 0.4 08/14/2018   ALKPHOS 57 08/14/2018   AST 22 08/14/2018   ALT 18 08/14/2018   PROT 5.2 (L) 08/14/2018   ALBUMIN 2.4 (L) 08/14/2018   CALCIUM 8.0 (L) 08/14/2018   GFRAA 50 (L) 08/16/2018    Speciality Comments: No specialty comments available.  Procedures:  Large Joint Inj: R greater trochanter on 07/24/2019 3:00 PM Indications: pain Details: 27 G 1.5 in needle, lateral approach  Arthrogram: No  Medications: 40 mg triamcinolone acetonide 40 MG/ML; 1.5 mL lidocaine 1 % Aspirate: 0 mL Outcome: tolerated well, no immediate complications Procedure, treatment alternatives, risks  and benefits explained, specific risks discussed. Consent was given by the patient. Immediately prior to procedure a time out was called to verify the correct patient, procedure, equipment, support staff and site/side marked as required. Patient was prepped and draped in the usual sterile fashion.   Large Joint Inj: R knee on 07/24/2019 3:00 PM Indications: pain Details: 27 G 1.5 in needle, medial approach  Arthrogram: No  Medications: 1.5 mL lidocaine 1 %; 40 mg triamcinolone acetonide 40 MG/ML Aspirate: 0 mL Outcome: tolerated well, no immediate complications Procedure, treatment alternatives, risks and benefits explained, specific risks discussed. Consent was given by the patient. Immediately prior to procedure a time out was called to verify the correct patient, procedure, equipment, support staff and site/side marked as required. Patient was prepped and draped in the usual sterile fashion.     Allergies: Codeine, Darvon [propoxyphene hcl], Fentanyl, Oxycodone hcl, Propoxyphene n-acetaminophen, Valium [diazepam], and Valsartan   Assessment / Plan:     Visit Diagnoses: Primary osteoarthritis of both hands: She has mild osteoarthritic changes in bilateral hands.  She has no tenderness or synovitis.  She has complete fist formation bilaterally.  Joint protection and muscle strengthening were discussed.  Primary osteoarthritis of right knee: She presents today with right knee joint pain.  She has good range of motion with some discomfort.  No warmth or effusion was noted.  She requested a right knee joint cortisone injection.  She tolerated procedure well.  The procedure note was completed above.  Aftercare was discussed.  She was advised to monitor her blood pressure closely following the cortisone injection.  She was encouraged to continue to walk with a cane to assist with ambulation.  History of total knee replacement, left: Doing well.  She has good range of motion no discomfort.  No  warmth or effusion was noted.  Trochanteric bursitis, right hip: She has tenderness over the right trochanteric bursa.  She has been performing stretching exercises on occasion.  She requested a right trochanter bursa cortisone injection today.  She tolerated the procedure well.  Aftercare was discussed.  The procedure note was completed above.  Fibromyalgia: Her fibromyalgia pain has been manageable.  She continues to have chronic fatigue related to insomnia.  She has nocturnal pain which causes interrupted sleep at night.  Good sleep hygiene was discussed.  DDD (degenerative disc disease), cervical: She has good ROM with some discomfort. She has no symptoms of radiculopathy.   DDD (degenerative disc disease), lumbar: She has limited  range of motion with some discomfort.  No midline spinal tenderness noted.  No symptoms of radiculopathy.  Other medical conditions are listed as follows:   Medication management  Positive TB test  History of osteopenia  History of atrial fibrillation  History of gastroesophageal reflux (GERD)  History of diabetes mellitus  History of hypothyroidism  History of asthma  History of hyperlipidemia  History of sleep apnea  Orders: Orders Placed This Encounter  Procedures  . Large Joint Inj  . Large Joint Inj   No orders of the defined types were placed in this encounter.     Follow-Up Instructions: Return in about 6 months (around 01/22/2020) for Fibromyalgia, Osteoarthritis.   Gearldine Bienenstock, PA-C  I examined and evaluated the patient with Sherron Ales PA.  Patient was experiencing of pain and discomfort today.  She had discomfort in the right trochanteric area in the right knee joint.  But her request right trochanteric bursa and right knee joint were injected with cortisone as described above after indications side effects contraindications were discussed.  The plan of care was discussed as noted above.  Pollyann Savoy, MD Note - This  record has been created using Animal nutritionist.  Chart creation errors have been sought, but may not always  have been located. Such creation errors do not reflect on  the standard of medical care.

## 2019-10-24 ENCOUNTER — Ambulatory Visit: Payer: Medicare Other | Admitting: Rheumatology

## 2020-01-09 ENCOUNTER — Ambulatory Visit: Payer: Medicare Other | Admitting: Rheumatology

## 2020-01-22 ENCOUNTER — Ambulatory Visit: Payer: Medicare Other | Admitting: Rheumatology

## 2020-03-03 ENCOUNTER — Ambulatory Visit (INDEPENDENT_AMBULATORY_CARE_PROVIDER_SITE_OTHER): Payer: Medicare Other | Admitting: Rheumatology

## 2020-03-03 ENCOUNTER — Encounter: Payer: Self-pay | Admitting: Rheumatology

## 2020-03-03 ENCOUNTER — Other Ambulatory Visit: Payer: Self-pay

## 2020-03-03 VITALS — BP 122/71 | HR 128 | Resp 18 | Ht 62.5 in | Wt 161.0 lb

## 2020-03-03 DIAGNOSIS — M19041 Primary osteoarthritis, right hand: Secondary | ICD-10-CM

## 2020-03-03 DIAGNOSIS — Z8719 Personal history of other diseases of the digestive system: Secondary | ICD-10-CM

## 2020-03-03 DIAGNOSIS — R7611 Nonspecific reaction to tuberculin skin test without active tuberculosis: Secondary | ICD-10-CM

## 2020-03-03 DIAGNOSIS — M1711 Unilateral primary osteoarthritis, right knee: Secondary | ICD-10-CM | POA: Diagnosis not present

## 2020-03-03 DIAGNOSIS — Z8669 Personal history of other diseases of the nervous system and sense organs: Secondary | ICD-10-CM

## 2020-03-03 DIAGNOSIS — M5136 Other intervertebral disc degeneration, lumbar region: Secondary | ICD-10-CM

## 2020-03-03 DIAGNOSIS — Z8709 Personal history of other diseases of the respiratory system: Secondary | ICD-10-CM

## 2020-03-03 DIAGNOSIS — Z8739 Personal history of other diseases of the musculoskeletal system and connective tissue: Secondary | ICD-10-CM

## 2020-03-03 DIAGNOSIS — Z96652 Presence of left artificial knee joint: Secondary | ICD-10-CM

## 2020-03-03 DIAGNOSIS — Z8679 Personal history of other diseases of the circulatory system: Secondary | ICD-10-CM

## 2020-03-03 DIAGNOSIS — M19042 Primary osteoarthritis, left hand: Secondary | ICD-10-CM

## 2020-03-03 DIAGNOSIS — M5030 Other cervical disc degeneration, unspecified cervical region: Secondary | ICD-10-CM

## 2020-03-03 DIAGNOSIS — M7061 Trochanteric bursitis, right hip: Secondary | ICD-10-CM

## 2020-03-03 DIAGNOSIS — Z8639 Personal history of other endocrine, nutritional and metabolic disease: Secondary | ICD-10-CM

## 2020-03-03 DIAGNOSIS — M797 Fibromyalgia: Secondary | ICD-10-CM

## 2020-03-03 MED ORDER — TRIAMCINOLONE ACETONIDE 40 MG/ML IJ SUSP
40.0000 mg | INTRAMUSCULAR | Status: AC | PRN
  Administered 2020-03-03: 40 mg via INTRA_ARTICULAR

## 2020-03-03 MED ORDER — LIDOCAINE HCL 1 % IJ SOLN
1.5000 mL | INTRAMUSCULAR | Status: AC | PRN
  Administered 2020-03-03: 1.5 mL

## 2020-03-03 NOTE — Progress Notes (Signed)
Office Visit Note  Patient: [name]             Date of Birth: Feb 14, 1941           MRN: [id number]             PCP: Gwenyth Bender, MD Referring: Gwenyth Bender, MD Visit Date: 03/03/2020 Occupation: @GUAROCC @  Subjective:  Hip Pain (Right hip pain, wants injection) and Knee Pain (Right knee pain, wants injection)   History of Present Illness: [name] is a 79 y.o. female with history of osteoarthritis and fibromyalgia.  She was accompanied by her son today.  She states she has been experiencing increased pain and discomfort in her right knee joint and her right trochanteric area.  She has been having difficulty walking.  She does not want to have right total knee replacement.  Activities of Daily Living:  Patient reports morning stiffness for 24 hours.   Patient Denies nocturnal pain.  Difficulty dressing/grooming: Denies Difficulty climbing stairs: Reports Difficulty getting out of chair: Denies Difficulty using hands for taps, buttons, cutlery, and/or writing: Reports  Review of Systems  Constitutional: Positive for fatigue. Negative for night sweats, weight gain and weight loss.  HENT: Negative for mouth sores, trouble swallowing, trouble swallowing, mouth dryness and nose dryness.   Eyes: Negative for pain, redness, visual disturbance and dryness.  Respiratory: Negative for cough, shortness of breath and difficulty breathing.   Cardiovascular: Negative for chest pain, palpitations, hypertension, irregular heartbeat and swelling in legs/feet.  Gastrointestinal: Negative for blood in stool, constipation and diarrhea.  Endocrine: Negative for excessive thirst and increased urination.  Genitourinary: Negative for difficulty urinating and vaginal dryness.  Musculoskeletal: Positive for arthralgias, gait problem, joint pain and morning stiffness. Negative for joint swelling, myalgias, muscle weakness, muscle tenderness and myalgias.  Skin: Negative for color  change, rash, hair loss, skin tightness, ulcers and sensitivity to sunlight.  Allergic/Immunologic: Negative for susceptible to infections.  Neurological: Negative for dizziness, numbness, memory loss, night sweats and weakness.  Hematological: Negative for bruising/bleeding tendency and swollen glands.  Psychiatric/Behavioral: Negative for depressed mood and sleep disturbance. The patient is not nervous/anxious.     PMFS History:  Patient Active Problem List   Diagnosis Date Noted  . DKA (diabetic ketoacidoses) (HCC) 08/08/2018  . Sepsis (HCC) 08/08/2018  . AKI (acute kidney injury) (HCC) 08/08/2018  . Acute metabolic encephalopathy 08/08/2018  . COPD (chronic obstructive pulmonary disease) (HCC) 08/08/2018  . HTN (hypertension) 08/08/2018  . Dementia (HCC) 08/08/2018  . Overactive bladder 08/08/2018  . Physical deconditioning 08/08/2018  . History of hyperlipidemia 12/26/2017  . History of atrial fibrillation 12/26/2017  . History of diabetes mellitus 12/26/2017  . History of hypothyroidism 12/26/2017  . History of sleep apnea 12/26/2017  . History of gastroesophageal reflux (GERD) 12/26/2017  . History of asthma 12/26/2017  . On prednisone therapy 05/04/2017  . Chronic left shoulder pain 01/12/2017  . Hx of cardiac cath 12/18/2016  . Chronic pain of right knee 12/07/2016  . Primary osteoarthritis of both hands 12/07/2016  . Chronic right shoulder pain 12/07/2016  . History of osteopenia 12/07/2016  . 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 Atrial fibrillation (HCC) 10/24/2016  . OSA (obstructive sleep apnea) 05/15/2008  . Hypothyroidism 04/03/2007  . Diabetes mellitus (HCC) 04/03/2007  . HLD (hyperlipidemia) 04/03/2007  . Asthma 04/03/2007  . GERD 04/03/2007  . Primary osteoarthritis of right knee 04/03/2007  . DDD (degenerative disc disease), lumbar 04/03/2007  .  DDD (degenerative disc disease), cervical 04/03/2007  . Fibromyalgia 04/03/2007  . Positive  TB test 04/03/2007  . TACHYCARDIA, HX OF 04/03/2007  . STATUS, KNEE JOINT REPLACEMENT 04/03/2007    Past Medical History:  Diagnosis Date  . Arthritis   . Asthma   . Bronchitis   . COPD (chronic obstructive pulmonary disease) (HCC)   . Diabetes mellitus   . Fibromyalgia   . Hyperlipidemia   . Hypertension   . Osteoporosis   . Rheumatoid aortitis   . Sleep apnea     Family History  Problem Relation Age of Onset  . Diabetes Mother   . Heart failure Mother   . Cancer Mother   . Heart failure Father   . Cancer Sister   . Cancer Sister   . Cancer Brother   . Multiple sclerosis Son    Past Surgical History:  Procedure Laterality Date  . JOINT REPLACEMENT     left total knee  . KNEE ARTHROPLASTY    . TOTAL KNEE ARTHROPLASTY     Social History   Social History Narrative  . Not on file   Immunization History  Administered Date(s) Administered  . Influenza-Unspecified 07/03/2013     Objective: Vital Signs: BP 122/71 (BP Location: Right Arm, Patient Position: Sitting, Cuff Size: Normal)   Pulse (!) 128   Resp 18   Ht 5' 2.5" (1.588 m)   Wt 161 lb (73 kg)   BMI 28.98 kg/m    Physical Exam Vitals and nursing note reviewed.  Constitutional:      Appearance: She is well-developed.  HENT:     Head: Normocephalic and atraumatic.  Eyes:     Conjunctiva/sclera: Conjunctivae normal.  Cardiovascular:     Rate and Rhythm: Normal rate and regular rhythm.     Heart sounds: Normal heart sounds.  Pulmonary:     Effort: Pulmonary effort is normal.     Breath sounds: Normal breath sounds.  Abdominal:     General: Bowel sounds are normal.     Palpations: Abdomen is soft.  Musculoskeletal:     Cervical back: Normal range of motion.  Lymphadenopathy:     Cervical: No cervical adenopathy.  Skin:    General: Skin is warm and dry.     Capillary Refill: Capillary refill takes less than 2 seconds.  Neurological:     Mental Status: She is alert and oriented to person,  place, and time.  Psychiatric:        Behavior: Behavior normal.      Musculoskeletal Exam: C-spine was in good range of motion.  She has discomfort range of motion of her lumbar spine.  Shoulder joints, elbow joints, wrist joints with good range of motion with no synovitis.  No synovitis was noted over MCPs or PIPs.  Left knee joint is replaced.  Right knee joint had some warmth and crepitus with range of motion.  No effusion was noted.  Ankles with good range of motion with no synovitis.  There was no tenderness over MTPs.  CDAI Exam: CDAI Score: -- Patient Global: --; Provider Global: -- Swollen: --; Tender: -- Joint Exam 03/03/2020   No joint exam has been documented for this visit   There is currently no information documented on the homunculus. Go to the Rheumatology activity and complete the homunculus joint exam.  Investigation: No additional findings.  Imaging: No results found.  Recent Labs: Lab Results  Component Value Date   WBC 14.7 (H) 08/14/2018   HGB  9.4 (L) 08/14/2018   PLT 190 08/14/2018   NA 142 08/14/2018   K 4.2 08/14/2018   CL 109 08/14/2018   CO2 26 08/14/2018   GLUCOSE 135 (H) 08/14/2018   BUN 9 08/14/2018   CREATININE 1.17 (H) 08/16/2018   BILITOT 0.4 08/14/2018   ALKPHOS 57 08/14/2018   AST 22 08/14/2018   ALT 18 08/14/2018   PROT 5.2 (L) 08/14/2018   ALBUMIN 2.4 (L) 08/14/2018   CALCIUM 8.0 (L) 08/14/2018   GFRAA 50 (L) 08/16/2018    Speciality Comments: No specialty comments available.  Procedures:  Large Joint Inj on 03/03/2020 3:32 PM Indications: pain Details: 27 G 1.5 in needle, medial approach  Arthrogram: No  Medications: 40 mg triamcinolone acetonide 40 MG/ML; 1.5 mL lidocaine 1 % Aspirate: 0 mL Outcome: tolerated well, no immediate complications Procedure, treatment alternatives, risks and benefits explained, specific risks discussed. Consent was given by the patient. Immediately prior to procedure a time out was called to  verify the correct patient, procedure, equipment, support staff and site/side marked as required. Patient was prepped and draped in the usual sterile fashion.   Large Joint Inj: R greater trochanter on 03/03/2020 3:32 PM Indications: pain Details: 27 G 1.5 in needle, lateral approach  Arthrogram: No  Medications: 40 mg triamcinolone acetonide 40 MG/ML; 1.5 mL lidocaine 1 % Aspirate: 0 mL Outcome: tolerated well, no immediate complications Procedure, treatment alternatives, risks and benefits explained, specific risks discussed. Consent was given by the patient. Immediately prior to procedure a time out was called to verify the correct patient, procedure, equipment, support staff and site/side marked as required. Patient was prepped and draped in the usual sterile fashion.     Allergies: Codeine, Darvon [propoxyphene hcl], Fentanyl, Oxycodone hcl, Propoxyphene n-acetaminophen, Valium [diazepam], and Valsartan   Assessment / Plan:     Visit Diagnoses: Trochanteric bursitis, right hip-patient having discomfort in her right trochanteric area.  She requested a cortisone injection.  We had detailed discussion about monitoring blood glucose level.  She and her son mentioned that her blood sugar has been running normal and they were monitored very closely.  After informed consent was obtained right trochanteric area was injected with cortisone and lidocaine as described above.  She tolerated the procedure well.  Postprocedure instructions were given.  Primary osteoarthritis of right knee-she has been experiencing increased pain for the last 2 months.  Per her request right knee joint was also injected with cortisone as described above.  She tolerated the procedure well.  I also reviewed her chart.  In 2015 she had her last Visco supplement injection.  She had an inadequate response to Visco supplement injections.  History of total knee replacement, left-doing well.  Primary osteoarthritis of both  hands-she continues to have some stiffness.  Fibromyalgia-she has generalized pain from fibromyalgia.  DDD (degenerative disc disease), cervical-she has limited range of motion.  DDD (degenerative disc disease), lumbar-she is chronic discomfort.  She mobilizes with the help of a cane.  History of osteoporosis-DEXA scan on June 29, 2017 showed a T score of -2.5.  Patient declined treatment for osteoporosis.  Other medical problems are listed as follows:  Positive TB test  History of atrial fibrillation  History of gastroesophageal reflux (GERD)  History of diabetes mellitus-according to patient and her son hypertension her blood sugars have been normal.  Have advised her to discuss this further with Dr. Marlou Sa.  In case her blood sugar goes up how to manage her blood glucose levels.  History of hypothyroidism  History of asthma  History of hyperlipidemia  History of sleep apnea  Orders: Orders Placed This Encounter  Procedures  . Large Joint Inj  . Large Joint Inj   No orders of the defined types were placed in this encounter.     Follow-Up Instructions: Return in about 4 months (around 07/03/2020) for Osteoarthritis, FMS.   Pollyann Savoy, MD  Note - This record has been created using Animal nutritionist.  Chart creation errors have been sought, but may not always  have been located. Such creation errors do not reflect on  the standard of medical care.

## 2020-06-28 ENCOUNTER — Other Ambulatory Visit: Payer: Self-pay

## 2020-06-28 ENCOUNTER — Emergency Department (HOSPITAL_COMMUNITY): Payer: Medicare Other

## 2020-06-28 ENCOUNTER — Encounter (HOSPITAL_COMMUNITY): Payer: Self-pay | Admitting: Obstetrics and Gynecology

## 2020-06-28 ENCOUNTER — Emergency Department (HOSPITAL_COMMUNITY)
Admission: EM | Admit: 2020-06-28 | Discharge: 2020-06-28 | Disposition: A | Discharge status: Home / Self Care | Payer: Medicare Other | Attending: Emergency Medicine | Admitting: Emergency Medicine

## 2020-06-28 DIAGNOSIS — Z20822 Contact with and (suspected) exposure to covid-19: Secondary | ICD-10-CM | POA: Diagnosis not present

## 2020-06-28 DIAGNOSIS — R531 Weakness: Secondary | ICD-10-CM

## 2020-06-28 DIAGNOSIS — J449 Chronic obstructive pulmonary disease, unspecified: Secondary | ICD-10-CM | POA: Insufficient documentation

## 2020-06-28 DIAGNOSIS — J45909 Unspecified asthma, uncomplicated: Secondary | ICD-10-CM | POA: Diagnosis not present

## 2020-06-28 DIAGNOSIS — I10 Essential (primary) hypertension: Secondary | ICD-10-CM | POA: Diagnosis not present

## 2020-06-28 DIAGNOSIS — Z7984 Long term (current) use of oral hypoglycemic drugs: Secondary | ICD-10-CM | POA: Insufficient documentation

## 2020-06-28 DIAGNOSIS — Z7982 Long term (current) use of aspirin: Secondary | ICD-10-CM | POA: Insufficient documentation

## 2020-06-28 DIAGNOSIS — Z87891 Personal history of nicotine dependence: Secondary | ICD-10-CM | POA: Diagnosis not present

## 2020-06-28 DIAGNOSIS — Z96652 Presence of left artificial knee joint: Secondary | ICD-10-CM | POA: Insufficient documentation

## 2020-06-28 DIAGNOSIS — F0390 Unspecified dementia without behavioral disturbance: Secondary | ICD-10-CM | POA: Insufficient documentation

## 2020-06-28 DIAGNOSIS — E860 Dehydration: Secondary | ICD-10-CM | POA: Diagnosis not present

## 2020-06-28 DIAGNOSIS — E039 Hypothyroidism, unspecified: Secondary | ICD-10-CM | POA: Diagnosis not present

## 2020-06-28 DIAGNOSIS — E119 Type 2 diabetes mellitus without complications: Secondary | ICD-10-CM | POA: Diagnosis not present

## 2020-06-28 LAB — URINALYSIS, ROUTINE W REFLEX MICROSCOPIC
Bacteria, UA: NONE SEEN
Bilirubin Urine: NEGATIVE
Glucose, UA: NEGATIVE mg/dL
Hgb urine dipstick: NEGATIVE
Ketones, ur: 80 mg/dL — AB
Leukocytes,Ua: NEGATIVE
Nitrite: NEGATIVE
Protein, ur: 30 mg/dL — AB
Specific Gravity, Urine: 1.021 (ref 1.005–1.030)
pH: 6 (ref 5.0–8.0)

## 2020-06-28 LAB — BASIC METABOLIC PANEL
Anion gap: 16 — ABNORMAL HIGH (ref 5–15)
BUN: 10 mg/dL (ref 8–23)
CO2: 24 mmol/L (ref 22–32)
Calcium: 9.3 mg/dL (ref 8.9–10.3)
Chloride: 101 mmol/L (ref 98–111)
Creatinine, Ser: 0.83 mg/dL (ref 0.44–1.00)
GFR calc Af Amer: >60 mL/min (ref >60)
GFR calc non Af Amer: >60 mL/min (ref >60)
Glucose, Bld: 88 mg/dL (ref 70–99)
Potassium: 3.1 mmol/L — ABNORMAL LOW (ref 3.5–5.1)
Sodium: 141 mmol/L (ref 135–145)

## 2020-06-28 LAB — CBC
HCT: 41.1 % (ref 36.0–46.0)
Hemoglobin: 13.2 g/dL (ref 12.0–15.0)
MCH: 27.6 pg (ref 26.0–34.0)
MCHC: 32.1 g/dL (ref 30.0–36.0)
MCV: 85.8 fL (ref 80.0–100.0)
Platelets: 313 10*3/uL (ref 150–400)
RBC: 4.79 MIL/uL (ref 3.87–5.11)
RDW: 14.8 % (ref 11.5–15.5)
WBC: 14.9 10*3/uL — ABNORMAL HIGH (ref 4.0–10.5)
nRBC: 0 % (ref 0.0–0.2)

## 2020-06-28 LAB — HEPATIC FUNCTION PANEL
ALT: 8 U/L (ref 0–44)
AST: 9 U/L — ABNORMAL LOW (ref 15–41)
Albumin: 3.1 g/dL — ABNORMAL LOW (ref 3.5–5.0)
Alkaline Phosphatase: 78 U/L (ref 38–126)
Bilirubin, Direct: 0.1 mg/dL (ref 0.0–0.2)
Indirect Bilirubin: 0.9 mg/dL (ref 0.3–0.9)
Total Bilirubin: 1 mg/dL (ref 0.3–1.2)
Total Protein: 6.9 g/dL (ref 6.5–8.1)

## 2020-06-28 LAB — RESP PANEL BY RT PCR (RSV, FLU A&B, COVID)
Influenza A by PCR: NEGATIVE
Influenza B by PCR: NEGATIVE
Respiratory Syncytial Virus by PCR: NEGATIVE
SARS Coronavirus 2 by RT PCR: NEGATIVE

## 2020-06-28 LAB — CBG MONITORING, ED: Glucose-Capillary: 77 mg/dL (ref 70–99)

## 2020-06-28 MED ORDER — POTASSIUM CHLORIDE CRYS ER 20 MEQ PO TBCR
30.0000 meq | EXTENDED_RELEASE_TABLET | Freq: Once | ORAL | Status: AC
  Administered 2020-06-28: 30 meq via ORAL
  Filled 2020-06-28: qty 1

## 2020-06-28 MED ORDER — IBUPROFEN 200 MG PO TABS
200.0000 mg | ORAL_TABLET | Freq: Once | ORAL | Status: AC
Start: 2020-06-28 — End: 2020-06-28
  Administered 2020-06-28: 200 mg via ORAL
  Filled 2020-06-28: qty 1

## 2020-06-28 MED ORDER — SODIUM CHLORIDE 0.9 % IV BOLUS
500.0000 mL | Freq: Once | INTRAVENOUS | Status: AC
  Administered 2020-06-28: 500 mL via INTRAVENOUS

## 2020-06-28 MED ORDER — DEXAMETHASONE SODIUM PHOSPHATE 10 MG/ML IJ SOLN: 6.0000 mg | Freq: Once | INTRAMUSCULAR | Status: DC

## 2020-06-28 NOTE — ED Notes (Signed)
One unsuccessful attempt to start an IV and draw blood.  Charge RN to attempt.

## 2020-06-28 NOTE — Discharge Instructions (Addendum)
Lab work and imaging look reassuring. Patient was dehydrated recommend continue hydration while at home. Please drink water, Pedialyte, Gatorade and stay away from sodas, teas, coffees as these can dehydrate you. Also recommend maintain a healthy diet as this can help with your weakness. I have ordered home PT and OT to come and evaluate the patient to help strengthen. Please maintain your at home medications.  I want you to follow-up with your primary care provider for further evaluation and management.  Come back to emergency department if you develop chest pain, shortness of breath, severe abdominal pain, nausea, vomiting, diarrhea.

## 2020-06-28 NOTE — ED Notes (Signed)
[facility] called for transport.  

## 2020-06-28 NOTE — ED Provider Notes (Signed)
[name]   CSN: [id number] Arrival date & time: 06/28/20  1112     History Chief Complaint  Patient presents with  . Weakness    [name] is a 79 y.o. female.  HPI   Patient with significant medical history of asthma, COPD, diabetes, hypertension, presents to the emergency department with chief complaint of generalized weakness and not feeling well.  Patient states she has not been feeling well for the last few days stating she has had a headache and feels like she cannot get off the couch.  She endorses since she cannot get off the couch she has urinated on herself multiple times and was concerned that she was in DKA.  Patient denies hitting her head, recent falls, She is think she has been taking her medications as prescribed.  Patient endorses she has right hip and knee pain which she states is chronic for her denies recent trauma to these areas.  Patient denies sore throat, congestion, cough, chest pain, shortness of breath, abdominal pain, nausea, vomiting, diarrhea, dysuria, worsening pedal edema.  Spoke with son who validated patient's story.  He states she has been more bedridden, staying more on the couch and not moving much.  He states she has been complaining of feeling more weak and is unsure if she has been taking her medication as prescribed.  He also mentions that she has chronic right hip and knee pain denies any recent falls or traumas to these areas.  He called for EMS because he found her laying on the couch covered in urine and thought she was more out of it.  Past Medical History:  Diagnosis Date  . Arthritis   . Asthma   . Bronchitis   . COPD (chronic obstructive pulmonary disease) ([place])   . Diabetes mellitus   . Fibromyalgia   . Hyperlipidemia   . Hypertension   . Osteoporosis   . Rheumatoid aortitis   . Sleep apnea     Patient Active Problem List   Diagnosis Date Noted  . DKA (diabetic  ketoacidoses) ([place]) 08/08/2018  . Sepsis ([place]) 08/08/2018  . AKI (acute kidney injury) ([place]) 08/08/2018  . Acute metabolic encephalopathy 50/56/9794  . COPD (chronic obstructive pulmonary disease) ([place]) 08/08/2018  . HTN (hypertension) 08/08/2018  . Dementia ([place]) 08/08/2018  . Overactive bladder 08/08/2018  . Physical deconditioning 08/08/2018  . History of hyperlipidemia 12/26/2017  . History of atrial fibrillation 12/26/2017  . History of diabetes mellitus 12/26/2017  . History of hypothyroidism 12/26/2017  . History of sleep apnea 12/26/2017  . History of gastroesophageal reflux (GERD) 12/26/2017  . History of asthma 12/26/2017  . On prednisone therapy 05/04/2017  . Chronic left shoulder pain 01/12/2017  . Hx of cardiac cath 12/18/2016  . Chronic pain of right knee 12/07/2016  . Primary osteoarthritis of both hands 12/07/2016  . Chronic right shoulder pain 12/07/2016  . History of osteopenia 12/07/2016  . 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 Atrial fibrillation ([place]) 10/24/2016  . OSA (obstructive sleep apnea) 05/15/2008  . Hypothyroidism 04/03/2007  . Diabetes mellitus ([place]) 04/03/2007  . HLD (hyperlipidemia) 04/03/2007  . Asthma 04/03/2007  . GERD 04/03/2007  . Primary osteoarthritis of right knee 04/03/2007  . DDD (degenerative disc disease), lumbar 04/03/2007  . DDD (degenerative disc disease), cervical 04/03/2007  . Fibromyalgia 04/03/2007  . 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 TACHYCARDIA, HX OF 04/03/2007  . STATUS, KNEE JOINT REPLACEMENT 04/03/2007  Past Surgical History:  Procedure Laterality Date  . JOINT REPLACEMENT     left total knee  . KNEE ARTHROPLASTY    . TOTAL KNEE ARTHROPLASTY       OB History   No obstetric history on file.     Family History  Problem Relation Age of Onset  . Diabetes Mother   . Heart failure Mother   . Cancer Mother   . Heart failure Father   . Cancer Sister   . Cancer Sister   . Cancer Brother   .  Multiple sclerosis Son     Social History   Tobacco Use  . Smoking status: Former Smoker    Packs/day: 1.00    Years: 30.00    Pack years: 30.00    Types: Cigarettes    Quit date: 01/11/1996    Years since quitting: 24.4  . Smokeless tobacco: Never Used  Vaping Use  . Vaping Use: Never used  Substance Use Topics  . Alcohol use: Not Currently  . Drug use: No    Home Medications Prior to Admission medications   Medication Sig Start Date End Date Taking? Authorizing Provider  albuterol (PROVENTIL HFA;VENTOLIN HFA) 108 (90 Base) MCG/ACT inhaler Inhale 1-2 puffs into the lungs every 6 (six) hours as needed for wheezing or shortness of breath.    [provider]  ammonium lactate (AMLACTIN) 12 % cream Apply topically 2 (two) times daily. to affected area 02/11/20   [provider]  aspirin 81 MG chewable tablet Chew 81 mg by mouth See admin instructions. Monday Wednesday Friday    [provider]  blood glucose meter kit and supplies KIT Dispense based on patient and insurance preference. Use up to four times daily as directed. (FOR ICD-9 250.00, 250.01). 08/14/18   Raiford Noble Latif, DO  diclofenac Sodium (VOLTAREN) 1 % GEL Voltaren Gel 3 grams to 3 large joints upto TID 3 TUBES with 3 refills 10/17/16   [provider]  donepezil (ARICEPT) 10 MG tablet Take 10 mg by mouth at bedtime. 02/10/20   [provider]  donepezil (ARICEPT) 5 MG tablet Take 5 mg by mouth daily. 08/03/18   [provider]  guaifenesin (ROBITUSSIN) 100 MG/5ML syrup Take 100 mg by mouth daily as needed for cough.    [provider]  Lancets Glory Rosebush ULTRASOFT) lancets  09/17/16   [provider]  levothyroxine (SYNTHROID, LEVOTHROID) 50 MCG tablet Take 50 mcg by mouth daily before breakfast. 11/23/16   [provider]  meclizine (ANTIVERT) 12.5 MG tablet Take 12.5 mg by mouth 3 (three) times daily as needed for dizziness. 06/12/16    [provider]  metFORMIN (GLUCOPHAGE-XR) 500 MG 24 hr tablet Take 250 mg by mouth daily. 12/07/16   [provider]  metoprolol succinate (TOPROL-XL) 25 MG 24 hr tablet Take 25 mg by mouth daily. 12/07/16   [provider]  montelukast (SINGULAIR) 10 MG tablet Take 10 mg by mouth daily. 07/28/16   [provider]  Multiple Vitamin (MULITIVITAMIN WITH MINERALS) TABS Take 1 tablet by mouth daily.    [provider]  omeprazole (PRILOSEC) 40 MG capsule Take 40 mg by mouth daily. 12/01/17   [provider]  ONE TOUCH ULTRA TEST test strip  09/17/16   [provider]  oxybutynin (DITROPAN-XL) 10 MG 24 hr tablet Take 10 mg by mouth daily.  12/28/16   [provider]  RA VITAMIN D-3 2000 units CAPS Take 1 capsule by  mouth daily. 09/15/16   [provider]  simvastatin (ZOCOR) 20 MG tablet Take 20 mg by mouth every evening.    [provider]  TAZTIA XT 120 MG 24 hr capsule Take 120 mg by mouth daily. 12/07/16   [provider]  tiotropium (SPIRIVA) 18 MCG inhalation capsule Place 18 mcg into inhaler and inhale daily.    [provider]  TURMERIC PO Take by mouth.    [provider]  Vitamins/Minerals TABS Take by mouth.    [provider]    Allergies    Codeine, Darvon [propoxyphene hcl], Fentanyl, Oxycodone hcl, Propoxyphene n-acetaminophen, Valium [diazepam], and Valsartan  Review of Systems   Review of Systems  Constitutional: Positive for chills and fatigue. Negative for fever.  HENT: Negative for congestion, sore throat, tinnitus and trouble swallowing.   Eyes: Negative for visual disturbance.  Respiratory: Negative for cough and shortness of breath.   Cardiovascular: Negative for chest pain and palpitations.  Gastrointestinal: Negative for abdominal pain, diarrhea, nausea and vomiting.  Genitourinary: Positive for frequency. Negative for decreased urine volume, dysuria and  enuresis.  Musculoskeletal: Negative for back pain.  Skin: Negative for rash.  Neurological: Positive for headaches. Negative for dizziness.  Hematological: Does not bruise/bleed easily.    Physical Exam Updated Vital Signs BP (!) 148/66 (BP Location: Right Arm)   Pulse (!) 109   Temp 99.5 F (37.5 C)   Resp (!) 25   SpO2 99%   Physical Exam Vitals and nursing Mcconnell reviewed.  Constitutional:      General: She is not in acute distress.    Appearance: Normal appearance. She is ill-appearing. She is not diaphoretic.  HENT:     Head: Normocephalic and atraumatic.     Nose: No congestion or rhinorrhea.     Mouth/Throat:     Mouth: Mucous membranes are moist.     Pharynx: Oropharynx is clear.  Eyes:     General: No visual field deficit or scleral icterus.    Conjunctiva/sclera: Conjunctivae normal.     Pupils: Pupils are equal, round, and reactive to light.  Cardiovascular:     Rate and Rhythm: Regular rhythm. Tachycardia present.     Pulses: Normal pulses.     Heart sounds: No murmur heard.  No friction rub. No gallop.   Pulmonary:     Effort: Pulmonary effort is normal. No respiratory distress.     Breath sounds: No wheezing, rhonchi or rales.  Abdominal:     General: There is no distension.     Palpations: Abdomen is soft.     Tenderness: There is no abdominal tenderness. There is no right CVA tenderness, left CVA tenderness or guarding.  Musculoskeletal:        General: No swelling or tenderness.     Right lower leg: No edema.     Left lower leg: No edema.  Skin:    General: Skin is warm and dry.     Capillary Refill: Capillary refill takes less than 2 seconds.     Findings: Erythema and rash present.     Comments: Patient's bottom was visualized there was erythema and a few skin abrasions noted on patient's buttocks.  There is no drainage or discharge noted.  It was tender to palpation, warm to the touch.  Neurological:     General: No focal deficit present.      Mental Status: She is alert and oriented to person, place, and time. Mental status is at baseline.  GCS: GCS eye subscore is 4. GCS verbal subscore is 5. GCS motor subscore is 6.     Cranial Nerves: Cranial nerves are intact. No cranial nerve deficit or facial asymmetry.     Sensory: Sensation is intact. No sensory deficit.     Motor: Motor function is intact. No weakness or pronator drift.     Coordination: Coordination is intact. Romberg sign negative. Finger-Nose-Finger Test and Heel to [facility] Test normal.  Psychiatric:        Mood and Affect: Mood normal.     ED Results / Procedures / Treatments   Labs (all labs ordered are listed, but only abnormal results are displayed) Labs Reviewed  BASIC METABOLIC PANEL - Abnormal; Notable for the following components:      Result Value   Potassium 3.1 (*)    Anion gap 16 (*)    All other components within normal limits  CBC - Abnormal; Notable for the following components:   WBC 14.9 (*)    All other components within normal limits  URINALYSIS, ROUTINE W REFLEX MICROSCOPIC - Abnormal; Notable for the following components:   Ketones, ur 80 (*)    Protein, ur 30 (*)    All other components within normal limits  HEPATIC FUNCTION PANEL - Abnormal; Notable for the following components:   Albumin 3.1 (*)    AST 9 (*)    All other components within normal limits  RESP PANEL BY RT PCR (RSV, FLU A&B, COVID)  CBG MONITORING, ED    EKG EKG Interpretation  Date/Time:  Sunday June 28 2020 12:06:49 EDT Ventricular Rate:  110 PR Interval:    QRS Duration: 106 QT Interval:  317 QTC Calculation: 429 R Axis:   11 Text Interpretation: Sinus tachycardia Low voltage, precordial leads When compared to prior, similar tacycardia. No STEMI Confirmed by Antony Blackbird [id number]) on 06/28/2020 1:12:25 PM   Radiology DG Chest 2 View  Result Date: 06/28/2020 CLINICAL DATA:  Cough EXAM: CHEST - 2 VIEW COMPARISON:  None. FINDINGS: Cardiomediastinal  silhouette is normal. Mediastinal contours appear intact. Tortuosity of the aorta. There is no evidence of focal airspace consolidation, pleural effusion or pneumothorax. Osseous structures are without acute abnormality. Soft tissues are grossly normal. IMPRESSION: No active cardiopulmonary disease. Electronically Signed   By: Fidela Salisbury M.D.   On: 06/28/2020 13:24    Procedures Procedures (including critical care time)  Medications Ordered in ED Medications  sodium chloride 0.9 % bolus 500 mL (0 mLs Intravenous Stopped 06/28/20 1725)  sodium chloride 0.9 % bolus 500 mL (0 mLs Intravenous Stopped 06/28/20 2115)  potassium chloride (KLOR-CON) CR tablet 30 mEq (30 mEq Oral Given 06/28/20 2104)  ibuprofen (ADVIL) tablet 200 mg (200 mg Oral Given 06/28/20 2103)    ED Course  I have reviewed the triage vital signs and the nursing notes.  Pertinent labs & imaging results that were available during my care of the patient were reviewed by me and considered in my medical decision making (see chart for details).    MDM Rules/Calculators/A&P                          Patient presents with chief complaint of generalized weakness.  She was alert and oriented, did not appear to be in acute distress, vital signs significant for tachycardia and tachypnea.  Will order screening labs, x-ray EKG and reevaluate. Start patient on fluids.  Initial labs reveal leukocytosis of 14.9 which appears to  be at baseline for patient, no signs of anemia.  BMP showing hypokalemia of 3.1, no metabolic acidosis, no AKI, gap of 16.  Will provide patient with oral potassium. Hepatic function shows hypoalbumin 3.1, respiratory panel negative for Covid, influenza a/B, RSV. UA does not show nitrates or leukocytes, no bacteria. Chest x-ray unremarkable. EKG was sinus tach without signs of ischemia, no ST elevation or depression noted.  Patient was reassessed after providing fluid and potassium. Patient states she feels much  better. She is tolerating p.o. and appears to be resting comfortably. Lung sounds were clear bilaterally no signs of fluid overload noted on exam.  I have low suspicion patient suffering from a systemic infection as patient is nontoxic-appearing, no obvious source infection identified, chest x-ray unremarkable, UA unremarkable. Patient's elevated white count appears to be at baseline, patient's tachycardia is most likely secondary to dehydration as she also had ketones and protein in her urine as well as being dry on exam. I have low suspicion for UTI or pyelonephritis as patient denies urinary symptoms, UA negative for leukocytes or nitrates, no CVA tenderness on exam. Low suspicion for ACS as patient denies chest pain, shortness of breath, EKG does not show signs of ischemia, no signs of hypoperfusion or fluid overload on exam. Low suspicion for CVA as patient denies headache, recent falls, neuro exam was benign. I suspect patient is dehydrated secondary to deconditioned state. Recommended SNF placement but patient preferred to be sent home but has agreed to home health. Spoke with patient's son who agrees with plan.  Patient vitals have remained stable, no indication for hospital admission. Patient was discussed with attending who evaluate the patient and agrees with assessment and plan. Patient was given at home care and strict return precautions. Final Clinical Impression(s) / ED Diagnoses Final diagnoses:  Weakness  Dehydration    Rx / DC Orders ED Discharge Orders         [place]        06/28/20 2037    Face-to-face encounter (required for Medicare/Medicaid patients)       Comments: I Marcello Fennel certify that this patient is under my care and that I, or a nurse practitioner or physician's assistant working with me, had a face-to-face encounter that meets the physician face-to-face encounter requirements with this patient on 06/28/2020. The encounter with the patient was  in whole, or in part for the following medical condition(s) which is the primary reason for home health care (List medical condition): Please follow the recommendation contents of patient's primary care provider.   06/28/20 2037           Marcello Fennel, PA-C 06/28/20 2209    Tegeler, Gwenyth Allegra, MD 06/29/20 [phone]

## 2020-06-28 NOTE — ED Triage Notes (Signed)
Patient reports to the ER by [facility] from home. Patient reports that she thinks she is taking her medications. Patient reports she lives at home alone but that "people check on her" Patient smells strongly of urine. EMS states that son reports he does not think she has been getting off her couch and that she has been more confused than normal. Patient is alert and oriented at this time but has a noted hx of dementia.

## 2020-07-05 ENCOUNTER — Other Ambulatory Visit: Payer: Self-pay

## 2020-07-05 ENCOUNTER — Emergency Department (HOSPITAL_COMMUNITY): Payer: Medicare Other

## 2020-07-05 ENCOUNTER — Emergency Department (HOSPITAL_COMMUNITY)
Admission: EM | Admit: 2020-07-05 | Discharge: 2020-07-06 | Disposition: A | Discharge status: Home / Self Care | Payer: Medicare Other | Attending: Emergency Medicine | Admitting: Emergency Medicine

## 2020-07-05 ENCOUNTER — Encounter (HOSPITAL_COMMUNITY): Payer: Self-pay

## 2020-07-05 DIAGNOSIS — Z7984 Long term (current) use of oral hypoglycemic drugs: Secondary | ICD-10-CM | POA: Insufficient documentation

## 2020-07-05 DIAGNOSIS — Z87891 Personal history of nicotine dependence: Secondary | ICD-10-CM | POA: Insufficient documentation

## 2020-07-05 DIAGNOSIS — I10 Essential (primary) hypertension: Secondary | ICD-10-CM | POA: Diagnosis not present

## 2020-07-05 DIAGNOSIS — Z79899 Other long term (current) drug therapy: Secondary | ICD-10-CM | POA: Diagnosis not present

## 2020-07-05 DIAGNOSIS — Z96652 Presence of left artificial knee joint: Secondary | ICD-10-CM | POA: Diagnosis not present

## 2020-07-05 DIAGNOSIS — J449 Chronic obstructive pulmonary disease, unspecified: Secondary | ICD-10-CM | POA: Diagnosis not present

## 2020-07-05 DIAGNOSIS — Z7982 Long term (current) use of aspirin: Secondary | ICD-10-CM | POA: Insufficient documentation

## 2020-07-05 DIAGNOSIS — R531 Weakness: Secondary | ICD-10-CM | POA: Diagnosis present

## 2020-07-05 DIAGNOSIS — J45909 Unspecified asthma, uncomplicated: Secondary | ICD-10-CM | POA: Diagnosis not present

## 2020-07-05 DIAGNOSIS — R1084 Generalized abdominal pain: Secondary | ICD-10-CM | POA: Insufficient documentation

## 2020-07-05 DIAGNOSIS — E039 Hypothyroidism, unspecified: Secondary | ICD-10-CM | POA: Diagnosis not present

## 2020-07-05 DIAGNOSIS — E119 Type 2 diabetes mellitus without complications: Secondary | ICD-10-CM | POA: Diagnosis not present

## 2020-07-05 DIAGNOSIS — R350 Frequency of micturition: Secondary | ICD-10-CM | POA: Insufficient documentation

## 2020-07-05 DIAGNOSIS — R140 Abdominal distension (gaseous): Secondary | ICD-10-CM | POA: Insufficient documentation

## 2020-07-05 DIAGNOSIS — E860 Dehydration: Secondary | ICD-10-CM

## 2020-07-05 LAB — CBC WITH DIFFERENTIAL/PLATELET
Abs Immature Granulocytes: 0.08 10*3/uL — ABNORMAL HIGH (ref 0.00–0.07)
Basophils Absolute: 0.1 10*3/uL (ref 0.0–0.1)
Basophils Relative: 0 %
Eosinophils Absolute: 0.2 10*3/uL (ref 0.0–0.5)
Eosinophils Relative: 1 %
HCT: 38.6 % (ref 36.0–46.0)
Hemoglobin: 12.2 g/dL (ref 12.0–15.0)
Immature Granulocytes: 0 %
Lymphocytes Relative: 19 %
Lymphs Abs: 3.7 10*3/uL (ref 0.7–4.0)
MCH: 27.2 pg (ref 26.0–34.0)
MCHC: 31.6 g/dL (ref 30.0–36.0)
MCV: 86 fL (ref 80.0–100.0)
Monocytes Absolute: 1.5 10*3/uL — ABNORMAL HIGH (ref 0.1–1.0)
Monocytes Relative: 7 %
Neutro Abs: 14.2 10*3/uL — ABNORMAL HIGH (ref 1.7–7.7)
Neutrophils Relative %: 73 %
Platelets: 363 10*3/uL (ref 150–400)
RBC: 4.49 MIL/uL (ref 3.87–5.11)
RDW: 15 % (ref 11.5–15.5)
WBC: 19.7 10*3/uL — ABNORMAL HIGH (ref 4.0–10.5)
nRBC: 0 % (ref 0.0–0.2)

## 2020-07-05 LAB — URINALYSIS, ROUTINE W REFLEX MICROSCOPIC
Bilirubin Urine: NEGATIVE
Glucose, UA: NEGATIVE mg/dL
Hgb urine dipstick: NEGATIVE
Ketones, ur: 80 mg/dL — AB
Leukocytes,Ua: NEGATIVE
Nitrite: NEGATIVE
Protein, ur: 30 mg/dL — AB
Specific Gravity, Urine: 1.027 (ref 1.005–1.030)
pH: 5 (ref 5.0–8.0)

## 2020-07-05 LAB — COMPREHENSIVE METABOLIC PANEL
ALT: 8 U/L (ref 0–44)
AST: 10 U/L — ABNORMAL LOW (ref 15–41)
Albumin: 3.3 g/dL — ABNORMAL LOW (ref 3.5–5.0)
Alkaline Phosphatase: 79 U/L (ref 38–126)
Anion gap: 12 (ref 5–15)
BUN: 20 mg/dL (ref 8–23)
CO2: 24 mmol/L (ref 22–32)
Calcium: 9.1 mg/dL (ref 8.9–10.3)
Chloride: 104 mmol/L (ref 98–111)
Creatinine, Ser: 0.86 mg/dL (ref 0.44–1.00)
GFR calc Af Amer: >60 mL/min (ref >60)
GFR calc non Af Amer: >60 mL/min (ref >60)
Glucose, Bld: 95 mg/dL (ref 70–99)
Potassium: 3.2 mmol/L — ABNORMAL LOW (ref 3.5–5.1)
Sodium: 140 mmol/L (ref 135–145)
Total Bilirubin: 0.9 mg/dL (ref 0.3–1.2)
Total Protein: 7.1 g/dL (ref 6.5–8.1)

## 2020-07-05 LAB — LACTIC ACID, PLASMA: Lactic Acid, Venous: 1.3 mmol/L (ref 0.5–1.9)

## 2020-07-05 MED ORDER — POTASSIUM CHLORIDE CRYS ER 20 MEQ PO TBCR
40.0000 meq | EXTENDED_RELEASE_TABLET | Freq: Once | ORAL | Status: AC
  Administered 2020-07-05: 40 meq via ORAL
  Filled 2020-07-05: qty 2

## 2020-07-05 MED ORDER — SODIUM CHLORIDE 0.9 % IV BOLUS
1000.0000 mL | Freq: Once | INTRAVENOUS | Status: AC
  Administered 2020-07-05: 1000 mL via INTRAVENOUS

## 2020-07-05 MED ORDER — SODIUM CHLORIDE 0.9 % IV SOLN: INTRAVENOUS | Status: DC

## 2020-07-05 NOTE — ED Notes (Signed)
Pt still unable to produce urine, when asked if we could perform an in-and-out cath to collect urine pt requested to wait a few moments longer. Will continue to monitor.

## 2020-07-05 NOTE — ED Notes (Signed)
EMS unsuccessfully attempted IV. Pt reports being difficult to get an IV on and having to get IVs in her thigh and feet before.

## 2020-07-05 NOTE — ED Triage Notes (Addendum)
Pt arrived via [facility] from home. Had weakness to the point of being unable to stand and has been unable to get up to go to the bathroom. She has had dark urine, pain with urination, and urinary frequnecy. Pt's son reports progressive deterioration since she was last discharged. A&Ox4  Hx of diabetes, dementia  Vitals per EMS BP: 112/60 HR: 100 RR: 24 Temp: 97.9 CBG 120  End tidal CO2 22-24 98% on RA  22G in LAC

## 2020-07-05 NOTE — ED Provider Notes (Addendum)
[facility] DEPT Provider Note   CSN: [id number] Arrival date & time: 07/05/20  1357     History Chief Complaint  Patient presents with  . Weakness  . Urinary Frequency    [name] is a 79 y.o. female.  79 year old female with history of dementia who presents from home due to weakness and inability to stand. Patient has been able to go to the bathroom. Questionable UTI symptoms consisting of dark urine and pain with urination. No reported fever or chills. No vomiting or diarrhea. She has had progressive deterioration as of late. EMS called and patient had a CBG of 120 and transported here for further management.        Past Medical History:  Diagnosis Date  . Arthritis   . Asthma   . Bronchitis   . COPD (chronic obstructive pulmonary disease) ([place])   . Diabetes mellitus   . Fibromyalgia   . Hyperlipidemia   . Hypertension   . Osteoporosis   . Rheumatoid aortitis   . Sleep apnea     Patient Active Problem List   Diagnosis Date Noted  . DKA (diabetic ketoacidoses) 08/08/2018  . Sepsis ([place]) 08/08/2018  . AKI (acute kidney injury) ([place]) 08/08/2018  . Acute metabolic encephalopathy 17/61/6073  . COPD (chronic obstructive pulmonary disease) ([place]) 08/08/2018  . HTN (hypertension) 08/08/2018  . Dementia ([place]) 08/08/2018  . Overactive bladder 08/08/2018  . Physical deconditioning 08/08/2018  . History of hyperlipidemia 12/26/2017  . History of atrial fibrillation 12/26/2017  . History of diabetes mellitus 12/26/2017  . History of hypothyroidism 12/26/2017  . History of sleep apnea 12/26/2017  . History of gastroesophageal reflux (GERD) 12/26/2017  . History of asthma 12/26/2017  . On prednisone therapy 05/04/2017  . Chronic left shoulder pain 01/12/2017  . Hx of cardiac cath 12/18/2016  . Chronic pain of right knee 12/07/2016  . Primary osteoarthritis of both hands 12/07/2016  . Chronic right shoulder pain 12/07/2016  .  History of osteopenia 12/07/2016  . 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 Atrial fibrillation ([place]) 10/24/2016  . OSA (obstructive sleep apnea) 05/15/2008  . Hypothyroidism 04/03/2007  . Diabetes mellitus ([place]) 04/03/2007  . HLD (hyperlipidemia) 04/03/2007  . Asthma 04/03/2007  . GERD 04/03/2007  . Primary osteoarthritis of right knee 04/03/2007  . DDD (degenerative disc disease), lumbar 04/03/2007  . DDD (degenerative disc disease), cervical 04/03/2007  . Fibromyalgia 04/03/2007  . 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 TACHYCARDIA, HX OF 04/03/2007  . STATUS, KNEE JOINT REPLACEMENT 04/03/2007    Past Surgical History:  Procedure Laterality Date  . JOINT REPLACEMENT     left total knee  . KNEE ARTHROPLASTY    . TOTAL KNEE ARTHROPLASTY       OB History   No obstetric history on file.     Family History  Problem Relation Age of Onset  . Diabetes Mother   . Heart failure Mother   . Cancer Mother   . Heart failure Father   . Cancer Sister   . Cancer Sister   . Cancer Brother   . Multiple sclerosis Son     Social History   Tobacco Use  . Smoking status: Former Smoker    Packs/day: 1.00    Years: 30.00    Pack years: 30.00    Types: Cigarettes    Quit date: 01/11/1996    Years since quitting: 24.4  . Smokeless tobacco: Never Used  Vaping Use  . Vaping  Use: Never used  Substance Use Topics  . Alcohol use: Not Currently  . Drug use: No    Home Medications Prior to Admission medications   Medication Sig Start Date End Date Taking? Authorizing Provider  albuterol (PROVENTIL HFA;VENTOLIN HFA) 108 (90 Base) MCG/ACT inhaler Inhale 1-2 puffs into the lungs every 6 (six) hours as needed for wheezing or shortness of breath.   Yes [provider]  aspirin 81 MG chewable tablet Chew 81 mg by mouth See admin instructions. Monday Wednesday Friday   Yes [provider]  diclofenac Sodium (VOLTAREN) 1 % GEL Apply 2 g topically 2 (two) times daily  as needed (pain).  10/17/16  Yes [provider]  ammonium lactate (AMLACTIN) 12 % cream Apply topically 2 (two) times daily. to affected area Patient not taking: Reported on 07/05/2020 02/11/20   [provider]  blood glucose meter kit and supplies KIT Dispense based on patient and insurance preference. Use up to four times daily as directed. (FOR ICD-9 250.00, 250.01). 08/14/18   Sheikh, Omair Latif, DO  donepezil (ARICEPT) 10 MG tablet Take 10 mg by mouth at bedtime. 02/10/20   [provider]  donepezil (ARICEPT) 5 MG tablet Take 5 mg by mouth daily. 08/03/18   [provider]  guaifenesin (ROBITUSSIN) 100 MG/5ML syrup Take 100 mg by mouth daily as needed for cough.    [provider]  Lancets (ONETOUCH ULTRASOFT) lancets  09/17/16   [provider]  levothyroxine (SYNTHROID, LEVOTHROID) 50 MCG tablet Take 50 mcg by mouth daily before breakfast. 11/23/16   [provider]  meclizine (ANTIVERT) 12.5 MG tablet Take 12.5 mg by mouth 3 (three) times daily as needed for dizziness. 06/12/16   [provider]  metFORMIN (GLUCOPHAGE-XR) 500 MG 24 hr tablet Take 250 mg by mouth daily. 12/07/16   [provider]  metoprolol succinate (TOPROL-XL) 25 MG 24 hr tablet Take 25 mg by mouth daily. 12/07/16   [provider]  montelukast (SINGULAIR) 10 MG tablet Take 10 mg by mouth daily. 07/28/16   [provider]  Multiple Vitamin (MULITIVITAMIN WITH MINERALS) TABS Take 1 tablet by mouth daily.    [provider]  omeprazole (PRILOSEC) 40 MG capsule Take 40 mg by mouth daily. 12/01/17   [provider]  ONE TOUCH ULTRA TEST test strip  09/17/16   [provider]  oxybutynin (DITROPAN-XL) 10 MG 24 hr tablet Take 10 mg by mouth daily.  12/28/16   [provider]  RA VITAMIN D-3 2000 units CAPS Take 1 capsule by mouth daily. 09/15/16   [provider]  simvastatin (ZOCOR) 20 MG tablet  Take 20 mg by mouth every evening.    [provider]  TAZTIA XT 120 MG 24 hr capsule Take 120 mg by mouth daily. 12/07/16   [provider]  tiotropium (SPIRIVA) 18 MCG inhalation capsule Place 18 mcg into inhaler and inhale daily.    [provider]  TURMERIC PO Take by mouth.    [provider]  Vitamins/Minerals TABS Take by mouth.    [provider]    Allergies    Codeine, Darvon [propoxyphene hcl], Fentanyl, Oxycodone hcl, Propoxyphene n-acetaminophen, Valium [diazepam], and Valsartan  Review of Systems   Review of Systems  All other systems reviewed and are negative.   Physical Exam Updated Vital Signs BP 139/63   Pulse (!) 104   Temp 98.9 F (37.2 C) (Oral)   Resp 14     Ht 1.588 m (5' 2.5")   Wt 79.4 kg   SpO2 96%   BMI 31.50 kg/m   Physical Exam Vitals and nursing note reviewed.  Constitutional:      General: She is not in acute distress.    Appearance: Normal appearance. She is well-developed. She is not toxic-appearing.  HENT:     Head: Normocephalic and atraumatic.  Eyes:     General: Lids are normal.     Conjunctiva/sclera: Conjunctivae normal.     Pupils: Pupils are equal, round, and reactive to light.  Neck:     Thyroid: No thyroid mass.     Trachea: No tracheal deviation.  Cardiovascular:     Rate and Rhythm: Normal rate and regular rhythm.     Heart sounds: Normal heart sounds. No murmur heard.  No gallop.   Pulmonary:     Effort: Pulmonary effort is normal. No respiratory distress.     Breath sounds: Normal breath sounds. No stridor. No decreased breath sounds, wheezing, rhonchi or rales.  Abdominal:     General: Bowel sounds are normal. There is no distension.     Palpations: Abdomen is soft.     Tenderness: There is no abdominal tenderness. There is no rebound.  Musculoskeletal:        General: No tenderness. Normal range of motion.     Cervical back: Normal range of motion and neck supple.  Skin:     General: Skin is warm and dry.     Findings: No abrasion or rash.  Neurological:     Mental Status: She is alert and oriented to person, place, and time.     GCS: GCS eye subscore is 4. GCS verbal subscore is 5. GCS motor subscore is 6.     Cranial Nerves: No cranial nerve deficit.     Sensory: No sensory deficit.     Comments: Strength is 5 of 5 in all 4 extremities.  Psychiatric:        Attention and Perception: Attention normal.        Mood and Affect: Affect is flat.     ED Results / Procedures / Treatments   Labs (all labs ordered are listed, but only abnormal results are displayed) Labs Reviewed  URINE CULTURE  LACTIC ACID, PLASMA  LACTIC ACID, PLASMA  COMPREHENSIVE METABOLIC PANEL  CBC WITH DIFFERENTIAL/PLATELET  URINALYSIS, ROUTINE W REFLEX MICROSCOPIC    EKG None  Radiology No results found.  Procedures Procedures (including critical care time)  Medications Ordered in ED Medications - No data to display  ED Course  I have reviewed the triage vital signs and the nursing notes.  Pertinent labs & imaging results that were available during my care of the patient were reviewed by me and considered in my medical decision making (see chart for details).    MDM Rules/Calculators/A&P                          Patient is afebrile here.  Urinalysis without infection.  Mild hypokalemia with a K of 3.2 and will give oral potassium.  Patient had leukocytosis on her CBC and chest x-ray without acute findings.  She subsequently had a abdominal CT which showed only pulmonary nodule.  Patient is urinalysis does show increased ketones.  Will give liter of saline here.  Patient given follow-up instructions for that. Final Clinical Impression(s) / ED Diagnoses Final diagnoses:  None   Pt with right lower lobe pulmonary   nodule Results communicated to Dr. Marlou Sa by telephone. He states that he will follow up Rx / DC Orders ED Discharge Orders    None       Lacretia Leigh,  MD 07/05/20 1954    Lacretia Leigh, MD 07/06/20 1054

## 2020-07-05 NOTE — ED Notes (Signed)
Attempted [place] IV start unsuccessful.

## 2020-07-06 LAB — URINE CULTURE: Culture: NO GROWTH

## 2020-07-06 NOTE — TOC Initial Note (Signed)
Transition of Care [facility]) - Initial/Assessment Note    Patient Details  Name: [name] MRN: [id number] Date of Birth: June 14, 1941  Transition of [facility]) CM/SW Contact:    Katherine Cousin, RN Phone Number:  [phone] 07/06/2020, 1:46 PM  Clinical Narrative:                 TOC CM contacted pt via phone and offered choice for [facility]. Pt agreeable to [facility]. Pt states she has [facility]. Son assist with her to appts. Contacted [facility] rep, [name] with new referral. Agency accepted referral.   Expected Discharge Plan: Home w Home Health Services Barriers to Discharge: No Barriers Identified   Patient Goals and CMS Choice Patient states their goals for this hospitalization and ongoing recovery are:: lives alone CMS Medicare.gov Compare Post Acute Care list provided to:: Patient Choice offered to / list presented to : Patient  Expected Discharge Plan and Services Expected Discharge Plan: Home w Home Health Services In-house Referral: Clinical Social Work Discharge Planning Services: CM Consult Post Acute Care Choice: Home Health Living arrangements for the past 2 months: Single Family Home                           HH Arranged: PT, OT HH Agency: [facility] Date HH Agency Contacted: 07/06/20 Time HH Agency Contacted: 1336 Representative spoke with at [facility] Agency: [name]  Prior Living Arrangements/Services Living arrangements for the past 2 months: Single Family Home Lives with:: Self Patient language and need for interpreter reviewed:: Yes Do you feel safe going back to the place where you live?: Yes      Need for Family Participation in Patient Care: No (Comment) Care giver support system in place?: No (comment) Current home services: DME (rollator) Criminal Activity/Legal Involvement Pertinent to Current Situation/Hospitalization: No - Comment as needed  Activities of Daily Living      Permission Sought/Granted Permission sought to share  information with : Case Manager Permission granted to share information with : Yes, Verbal Permission Granted  Share Information with NAME: [name]  Permission granted to share info w AGENCY: Home Health  Permission granted to share info w Relationship: son  Permission granted to share info w Contact Information: [phone]  Emotional Assessment       Orientation: : Oriented to Self, Oriented to Place, Oriented to  Time, Oriented to Situation   Psych Involvement: No (comment)  Admission diagnosis:  sepsis Patient Active Problem List   Diagnosis Date Noted   DKA (diabetic ketoacidoses) 08/08/2018   Sepsis (HCC) 08/08/2018   AKI (acute kidney injury) (HCC) 08/08/2018   Acute metabolic encephalopathy 08/08/2018   COPD (chronic obstructive pulmonary disease) (H[facility]) 08/08/2018   HTN (hypertension) 08/08/2018   Dementia (HCC) 08/08/2018   Overactive bladder 08/08/2018   Physical deconditioning 08/08/2018   History of hyperlipidemia 12/26/2017   History of atrial fibrillation 12/26/2017   History of diabetes mellitus 12/26/2017   History of hypothyroidism 12/26/2017   History of sleep apnea 12/26/2017   History of gastroesophageal reflux (GERD) 12/26/2017   History of asthma 12/26/2017   On prednisone therapy 05/04/2017   Chronic left shoulder pain 01/12/2017   Hx of cardiac cath 12/18/2016   Chronic pain of right knee 12/07/2016   Primary osteoarthritis of both hands 12/07/2016   Chronic right shoulder pain 12/07/2016   History of osteopenia 12/07/2016   History of total knee replacement, left  12/07/2016   Atrial fibrillation (HCC) 10/24/2016   OSA (obstructive sleep apnea) 05/15/2008   Hypothyroidism 04/03/2007   Diabetes mellitus (HCC) 04/03/2007   HLD (hyperlipidemia) 04/03/2007   Asthma 04/03/2007   GERD 04/03/2007   Primary osteoarthritis of right knee 04/03/2007   DDD (degenerative disc disease), lumbar 04/03/2007   DDD  (degenerative disc disease), cervical 04/03/2007   Fibromyalgia 04/03/2007   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   TACHYCARDIA, HX OF 04/03/2007   STATUS, KNEE JOINT REPLACEMENT 04/03/2007   PCP:  Gwenyth Bender, MD Pharmacy:   [facility] [phone] [place], [place] - [address] AT [facility] OF [address] [place] [address] Phone: [phone] Fax: [phone]     Social Determinants of Health (SDOH) Interventions    Readmission Risk Interventions No flowsheet data found.

## 2020-07-07 ENCOUNTER — Ambulatory Visit: Payer: Medicare Other | Admitting: Rheumatology

## 2020-07-07 ENCOUNTER — Telehealth: Payer: Self-pay

## 2020-07-07 NOTE — Telephone Encounter (Signed)
Patient had a very good response to Visco supplement injections.  She can have temporary benefit from cortisone injection.  I do not see x-ray of her knee joints in the system.  We can also get x-rays at the next visit to evaluate.

## 2020-07-07 NOTE — Telephone Encounter (Signed)
Patient's [name] called to cancel [name]'s appointment scheduled for today due to just being discharged from the hospital.  [name] states at her last appointment with Dr. Corliss Skains they discussed gel injections for her right knee.  [name] is requesting a return call to discuss applying for injections before he reschedules her appointment.

## 2020-07-07 NOTE — Telephone Encounter (Signed)
Patient's son advised she can have temporary benefit from cortisone injection. Advised we do not see x-ray of her knee joints in the system.  We can also get x-rays at the next visit to evaluate. Appointment has been rescheduled for 07/23/2020 at 2:20 pm

## 2020-07-23 ENCOUNTER — Ambulatory Visit: Payer: Medicare Other | Admitting: Physician Assistant

## 2022-12-14 ENCOUNTER — Emergency Department (HOSPITAL_COMMUNITY): Payer: Medicare Other

## 2022-12-14 ENCOUNTER — Encounter (HOSPITAL_COMMUNITY): Payer: Self-pay

## 2022-12-14 ENCOUNTER — Other Ambulatory Visit: Payer: Self-pay

## 2022-12-14 ENCOUNTER — Emergency Department (HOSPITAL_COMMUNITY)
Admission: EM | Admit: 2022-12-14 | Discharge: 2022-12-15 | Disposition: A | Discharge status: Home / Self Care | Payer: Medicare Other | Attending: Emergency Medicine | Admitting: Emergency Medicine

## 2022-12-14 DIAGNOSIS — R197 Diarrhea, unspecified: Secondary | ICD-10-CM | POA: Diagnosis not present

## 2022-12-14 DIAGNOSIS — R531 Weakness: Secondary | ICD-10-CM | POA: Diagnosis not present

## 2022-12-14 DIAGNOSIS — Z7982 Long term (current) use of aspirin: Secondary | ICD-10-CM | POA: Insufficient documentation

## 2022-12-14 DIAGNOSIS — R112 Nausea with vomiting, unspecified: Secondary | ICD-10-CM | POA: Insufficient documentation

## 2022-12-14 DIAGNOSIS — Z20822 Contact with and (suspected) exposure to covid-19: Secondary | ICD-10-CM | POA: Insufficient documentation

## 2022-12-14 DIAGNOSIS — R2681 Unsteadiness on feet: Secondary | ICD-10-CM | POA: Insufficient documentation

## 2022-12-14 DIAGNOSIS — R262 Difficulty in walking, not elsewhere classified: Secondary | ICD-10-CM | POA: Diagnosis not present

## 2022-12-14 DIAGNOSIS — F0390 Unspecified dementia without behavioral disturbance: Secondary | ICD-10-CM | POA: Diagnosis not present

## 2022-12-14 DIAGNOSIS — R6251 Failure to thrive (child): Secondary | ICD-10-CM

## 2022-12-14 LAB — CBC WITH DIFFERENTIAL/PLATELET
Abs Immature Granulocytes: 0.12 10*3/uL — ABNORMAL HIGH (ref 0.00–0.07)
Basophils Absolute: 0.1 10*3/uL (ref 0.0–0.1)
Basophils Relative: 0 %
Eosinophils Absolute: 0 10*3/uL (ref 0.0–0.5)
Eosinophils Relative: 0 %
HCT: 39.4 % (ref 36.0–46.0)
Hemoglobin: 12.4 g/dL (ref 12.0–15.0)
Immature Granulocytes: 1 %
Lymphocytes Relative: 25 %
Lymphs Abs: 4.7 10*3/uL — ABNORMAL HIGH (ref 0.7–4.0)
MCH: 26.8 pg (ref 26.0–34.0)
MCHC: 31.5 g/dL (ref 30.0–36.0)
MCV: 85.1 fL (ref 80.0–100.0)
Monocytes Absolute: 1.2 10*3/uL — ABNORMAL HIGH (ref 0.1–1.0)
Monocytes Relative: 7 %
Neutro Abs: 12.7 10*3/uL — ABNORMAL HIGH (ref 1.7–7.7)
Neutrophils Relative %: 67 %
Platelets: 332 10*3/uL (ref 150–400)
RBC: 4.63 MIL/uL (ref 3.87–5.11)
RDW: 15 % (ref 11.5–15.5)
WBC: 18.8 10*3/uL — ABNORMAL HIGH (ref 4.0–10.5)
nRBC: 0 % (ref 0.0–0.2)

## 2022-12-14 LAB — COMPREHENSIVE METABOLIC PANEL
ALT: 6 U/L (ref 0–44)
AST: 20 U/L (ref 15–41)
Albumin: 4.1 g/dL (ref 3.5–5.0)
Alkaline Phosphatase: 103 U/L (ref 38–126)
Anion gap: 16 — ABNORMAL HIGH (ref 5–15)
BUN: 30 mg/dL — ABNORMAL HIGH (ref 8–23)
CO2: 23 mmol/L (ref 22–32)
Calcium: 9.1 mg/dL (ref 8.9–10.3)
Chloride: 100 mmol/L (ref 98–111)
Creatinine, Ser: 1.32 mg/dL — ABNORMAL HIGH (ref 0.44–1.00)
GFR, Estimated: 40 mL/min — ABNORMAL LOW (ref >60)
Glucose, Bld: 112 mg/dL — ABNORMAL HIGH (ref 70–99)
Potassium: 3.4 mmol/L — ABNORMAL LOW (ref 3.5–5.1)
Sodium: 139 mmol/L (ref 135–145)
Total Bilirubin: 0.8 mg/dL (ref 0.3–1.2)
Total Protein: 8 g/dL (ref 6.5–8.1)

## 2022-12-14 LAB — URINALYSIS, ROUTINE W REFLEX MICROSCOPIC
Bilirubin Urine: NEGATIVE
Glucose, UA: NEGATIVE mg/dL
Hgb urine dipstick: NEGATIVE
Ketones, ur: 5 mg/dL — AB
Leukocytes,Ua: NEGATIVE
Nitrite: NEGATIVE
Protein, ur: NEGATIVE mg/dL
Specific Gravity, Urine: >1.046 — ABNORMAL HIGH (ref 1.005–1.030)
pH: 5 (ref 5.0–8.0)

## 2022-12-14 LAB — RESP PANEL BY RT-PCR (RSV, FLU A&B, COVID)  RVPGX2
Influenza A by PCR: NEGATIVE
Influenza B by PCR: NEGATIVE
Resp Syncytial Virus by PCR: NEGATIVE
SARS Coronavirus 2 by RT PCR: NEGATIVE

## 2022-12-14 LAB — LIPASE, BLOOD: Lipase: 56 U/L — ABNORMAL HIGH (ref 11–51)

## 2022-12-14 LAB — MAGNESIUM: Magnesium: 2.1 mg/dL (ref 1.7–2.4)

## 2022-12-14 MED ORDER — LEVOTHYROXINE SODIUM 50 MCG PO TABS
50.0000 ug | ORAL_TABLET | Freq: Every day | ORAL | Status: DC
  Administered 2022-12-15: 50 ug via ORAL
  Filled 2022-12-14: qty 1

## 2022-12-14 MED ORDER — DONEPEZIL HCL 5 MG PO TABS
10.0000 mg | ORAL_TABLET | Freq: Every day | ORAL | Status: DC
  Administered 2022-12-14: 10 mg via ORAL
  Filled 2022-12-14: qty 2

## 2022-12-14 MED ORDER — IOHEXOL 300 MG/ML  SOLN
75.0000 mL | Freq: Once | INTRAMUSCULAR | Status: AC | PRN
  Administered 2022-12-14: 75 mL via INTRAVENOUS

## 2022-12-14 MED ORDER — OXYBUTYNIN CHLORIDE ER 10 MG PO TB24
10.0000 mg | ORAL_TABLET | Freq: Every day | ORAL | Status: DC
  Administered 2022-12-15: 10 mg via ORAL
  Filled 2022-12-14 (×2): qty 1

## 2022-12-14 MED ORDER — MEMANTINE HCL 5 MG PO TABS
10.0000 mg | ORAL_TABLET | Freq: Every day | ORAL | Status: DC
  Administered 2022-12-14: 10 mg via ORAL
  Filled 2022-12-14: qty 2

## 2022-12-14 MED ORDER — MONTELUKAST SODIUM 10 MG PO TABS
10.0000 mg | ORAL_TABLET | Freq: Every day | ORAL | Status: DC
  Administered 2022-12-15: 10 mg via ORAL
  Filled 2022-12-14 (×2): qty 1

## 2022-12-14 MED ORDER — METOPROLOL SUCCINATE ER 25 MG PO TB24: 25.0000 mg | ORAL_TABLET | Freq: Every day | ORAL | Status: DC

## 2022-12-14 MED ORDER — ASPIRIN 81 MG PO CHEW: 81.0000 mg | CHEWABLE_TABLET | ORAL | Status: DC

## 2022-12-14 MED ORDER — LEVOTHYROXINE SODIUM 50 MCG PO TABS: 50.0000 ug | ORAL_TABLET | Freq: Every day | ORAL | Status: DC

## 2022-12-14 MED ORDER — DILTIAZEM HCL ER BEADS 120 MG PO CP24: 120.0000 mg | ORAL_CAPSULE | Freq: Every day | ORAL | Status: DC

## 2022-12-14 MED ORDER — ONDANSETRON HCL 4 MG/2ML IJ SOLN
4.0000 mg | Freq: Once | INTRAMUSCULAR | Status: AC
  Administered 2022-12-14: 4 mg via INTRAVENOUS
  Filled 2022-12-14: qty 2

## 2022-12-14 MED ORDER — SIMVASTATIN 20 MG PO TABS: 20.0000 mg | ORAL_TABLET | Freq: Every day | ORAL | Status: DC

## 2022-12-14 MED ORDER — MONTELUKAST SODIUM 10 MG PO TABS: 10.0000 mg | ORAL_TABLET | Freq: Every day | ORAL | Status: DC

## 2022-12-14 MED ORDER — PANTOPRAZOLE SODIUM 40 MG PO TBEC
40.0000 mg | DELAYED_RELEASE_TABLET | Freq: Every day | ORAL | Status: DC
  Administered 2022-12-14 – 2022-12-15 (×2): 40 mg via ORAL
  Filled 2022-12-14 (×2): qty 1

## 2022-12-14 MED ORDER — METOPROLOL SUCCINATE ER 50 MG PO TB24
25.0000 mg | ORAL_TABLET | Freq: Every day | ORAL | Status: DC
  Administered 2022-12-15: 25 mg via ORAL
  Filled 2022-12-14: qty 1

## 2022-12-14 MED ORDER — TIOTROPIUM BROMIDE MONOHYDRATE 18 MCG IN CAPS: 18.0000 ug | ORAL_CAPSULE | Freq: Every day | RESPIRATORY_TRACT | Status: DC

## 2022-12-14 MED ORDER — SIMVASTATIN 20 MG PO TABS
20.0000 mg | ORAL_TABLET | Freq: Every day | ORAL | Status: DC
  Administered 2022-12-15: 20 mg via ORAL
  Filled 2022-12-14: qty 1

## 2022-12-14 MED ORDER — SODIUM CHLORIDE 0.9 % IV BOLUS
1000.0000 mL | Freq: Once | INTRAVENOUS | Status: AC
  Administered 2022-12-14: 1000 mL via INTRAVENOUS

## 2022-12-14 MED ORDER — SERTRALINE HCL 50 MG PO TABS
50.0000 mg | ORAL_TABLET | Freq: Every day | ORAL | Status: DC
  Administered 2022-12-14: 50 mg via ORAL
  Filled 2022-12-14: qty 1

## 2022-12-14 MED ORDER — UMECLIDINIUM BROMIDE 62.5 MCG/ACT IN AEPB
1.0000 | INHALATION_SPRAY | Freq: Every day | RESPIRATORY_TRACT | Status: DC
  Filled 2022-12-14: qty 7

## 2022-12-14 NOTE — ED Provider Notes (Signed)
[facility] Provider Note   CSN: [id number] Arrival date & time: 12/14/22  1103     History  Chief Complaint  Patient presents with   Abdominal Pain   Nausea    [name] is a 82 y.o. female with a history of dementia, presenting from home with nausea vomiting and diarrhea and abdominal pain.  It has been ongoing for approximately 3 days.  Patient lives with her family.  She has not been able to keep down any fluids and has been having diarrhea for now for 3 days.  Her son Randall Hiss reports patient has not been eating much for the past 4-5 days, not taking home meds, feeling more fatigued, difficulty getting up, was found "whining strangely" on her couch today that she did not feel good.  His son also reports that the patient has not been able to walk as per normal for the past 4 to 5 days due to her weakness, and has frequently been found with diarrhea or stool on her.  He has been attempting to take care of her, but reports he is also the primary caretaker for his father who is bedbound, and he does not feel that she has enough attention at home.  He is concerned that she is not taking her medications.  HPI     Home Medications Prior to Admission medications   Medication Sig Start Date End Date Taking? Authorizing Provider  acetaminophen (TYLENOL) 500 MG tablet Take 1,000 mg by mouth daily.    [provider]  ammonium lactate (AMLACTIN) 12 % cream Apply topically 2 (two) times daily. to affected area Patient not taking: Reported on 07/05/2020 02/11/20   [provider]  aspirin 81 MG chewable tablet Chew 81 mg by mouth See admin instructions. Monday Wednesday Friday    [provider]  blood glucose meter kit and supplies KIT Dispense based on patient and insurance preference. Use up to four times daily as directed. (FOR ICD-9 250.00, 250.01). 08/14/18   Raiford Noble Latif, DO  donepezil (ARICEPT) 10 MG  tablet Take 10 mg by mouth at bedtime. 02/10/20   [provider]  Lancets Glory Rosebush ULTRASOFT) lancets  09/17/16   [provider]  levothyroxine (SYNTHROID, LEVOTHROID) 50 MCG tablet Take 50 mcg by mouth daily before breakfast. 11/23/16   [provider]  meclizine (ANTIVERT) 12.5 MG tablet Take 12.5 mg by mouth 3 (three) times daily as needed for dizziness. 06/12/16   [provider]  memantine (NAMENDA) 10 MG tablet Take 10 mg by mouth at bedtime. 04/24/20   [provider]  metoprolol succinate (TOPROL-XL) 25 MG 24 hr tablet Take 25 mg by mouth daily. 12/07/16   [provider]  montelukast (SINGULAIR) 10 MG tablet Take 10 mg by mouth daily. 07/28/16   [provider]  naproxen sodium (ALEVE) 220 MG tablet Take 440 mg by mouth daily.    [provider]  omeprazole (PRILOSEC) 40 MG capsule Take 40 mg by mouth daily. 12/01/17   [provider]  ONE TOUCH ULTRA TEST test strip  09/17/16   [provider]  oxybutynin (DITROPAN-XL) 10 MG 24 hr tablet Take 10 mg by mouth daily.  12/28/16   [provider]  RA VITAMIN D-3 2000 units CAPS Take 1 capsule by mouth daily. 09/15/16   [provider]  sertraline (ZOLOFT) 50 MG tablet Take 50 mg by mouth at bedtime. 06/06/20   [provider]  simvastatin (ZOCOR) 20 MG tablet Take 20 mg by mouth daily.     [provider]  TAZTIA XT 120 MG 24 hr capsule Take 120 mg by mouth daily. 12/07/16   [provider]  tiotropium (SPIRIVA) 18 MCG inhalation capsule Place 18 mcg into inhaler and inhale daily.    [provider]  TURMERIC PO Take 1 tablet by mouth daily.     [provider]      Allergies    Codeine, Darvon [propoxyphene hcl], Fentanyl, Oxycodone hcl, Propoxyphene n-acetaminophen, Valium [diazepam], and Valsartan    Review of Systems   Review of Systems  Physical Exam Updated Vital Signs BP (!) 107/91    Pulse (!) 103   Temp 97.7 F (36.5 C) (Oral)   Resp 16   Ht 5' 2.5" (1.588 m)   Wt 81.6 kg   SpO2 100%   BMI 32.40 kg/m  Physical Exam Constitutional:      General: She is not in acute distress. HENT:     Head: Normocephalic and atraumatic.  Eyes:     Conjunctiva/sclera: Conjunctivae normal.     Pupils: Pupils are equal, round, and reactive to light.  Cardiovascular:     Rate and Rhythm: Normal rate and regular rhythm.  Pulmonary:     Effort: Pulmonary effort is normal. No respiratory distress.  Abdominal:     General: There is no distension.     Tenderness: There is no abdominal tenderness.  Skin:    General: Skin is warm and dry.  Neurological:     General: No focal deficit present.     Mental Status: She is alert. Mental status is at baseline.     ED Results / Procedures / Treatments   Labs (all labs ordered are listed, but only abnormal results are displayed) Labs Reviewed  COMPREHENSIVE METABOLIC PANEL - Abnormal; Notable for the following components:      Result Value   Potassium 3.4 (*)    Glucose, Bld 112 (*)    BUN 30 (*)    Creatinine, Ser 1.32 (*)    GFR, Estimated 40 (*)    Anion gap 16 (*)    All other components within normal limits  CBC WITH DIFFERENTIAL/PLATELET - Abnormal; Notable for the following components:   WBC 18.8 (*)    Neutro Abs 12.7 (*)    Lymphs Abs 4.7 (*)    Monocytes Absolute 1.2 (*)    Abs Immature Granulocytes 0.12 (*)    All other components within normal limits  LIPASE, BLOOD - Abnormal; Notable for the following components:   Lipase 56 (*)    All other components within normal limits  RESP PANEL BY RT-PCR (RSV, FLU A&B, COVID)  RVPGX2  MAGNESIUM  URINALYSIS, ROUTINE W REFLEX MICROSCOPIC    EKG None  Radiology US Abdomen Limited RUQ (LIVER/GB)  Result Date: 12/14/2022 CLINICAL DATA:  Abdominal pain EXAM: ULTRASOUND ABDOMEN LIMITED RIGHT UPPER QUADRANT COMPARISON:  None Available. FINDINGS: Gallbladder: No gallstones  or wall thickening visualized. No sonographic Murphy sign noted by sonographer. Common bile duct: Diameter: 2.1 mm Liver: No focal lesion identified. Within normal limits in parenchymal echogenicity. Portal vein is patent on color Doppler imaging with normal direction of blood flow towards the liver. Other: None. IMPRESSION: No acute sonographic abnormality. Electronically Signed   By: Yetta Glassman M.D.   On: 12/14/2022 17:31   CT ABDOMEN PELVIS W CONTRAST  Result Date: 12/14/2022 CLINICAL DATA:  Generalized abdominal pain EXAM:  CT ABDOMEN AND PELVIS WITH CONTRAST TECHNIQUE: Multidetector CT imaging of the abdomen and pelvis was performed using the standard protocol following bolus administration of intravenous contrast. RADIATION DOSE REDUCTION: This exam was performed according to the departmental dose-optimization program which includes automated exposure control, adjustment of the mA and/or kV according to patient size and/or use of iterative reconstruction technique. CONTRAST:  12m OMNIPAQUE IOHEXOL 300 MG/ML  SOLN COMPARISON:  CT abdomen and pelvis dated July 05, 2020 FINDINGS: Lower chest: Sub solid-appearing nodule of the right lower lobe measuring 7 x 5 cm on series 4 image 2 with approximate 5 mm solid component, unchanged when compared with the prior exam. Hepatobiliary: No suspicious liver lesion. Gallbladder is distended with no gallbladder wall thickening. Mild intrahepatic biliary ductal dilation. Normal caliber common bile duct, measuring 4 mm. Pancreas: Unremarkable. No pancreatic ductal dilatation or surrounding inflammatory changes. Spleen: Normal in size without focal abnormality. Adrenals/Urinary Tract: Bilateral adrenal glands are unremarkable. No hydronephrosis or nephrolithiasis. Bladder is unremarkable. Stomach/Bowel: Stomach is within normal limits. Appendix appears normal. Mild diverticulosis. No evidence of bowel wall thickening, distention, or inflammatory changes.  Vascular/Lymphatic: Aortic atherosclerosis. No enlarged abdominal or pelvic lymph nodes. Reproductive: Enlarged multi fibroid uterus, some of the fibroids are calcified. Other: No abdominal wall hernia or abnormality. No abdominopelvic ascites. Musculoskeletal: Unchanged lucent lesions of the bilateral iliac bones, likely benign. No aggressive appearing osseous lesions. Mild levocurvature of the lumbar spine. IMPRESSION: 1. No acute findings in the abdomen or pelvis. 2. Mild intrahepatic biliary ductal dilation with normal caliber common bile duct. Recommend correlation with liver function tests. If LFTs are abnormal, MRCP could be performed. 3. Mild gallbladder distention with no evidence of wall thickening or adjacent inflammatory change, likely due to fasted state. If there is concern for acute cholecystitis, gallbladder ultrasound could be performed further evaluation. 4. Stable part solid nodule of the right lower lobe measuring 6 mm in mean diameter. Recommend follow-up chest CT 1 year to ensure continued stability. 5. Aortic Atherosclerosis (ICD10-I70.0). Electronically Signed   By: LYetta GlassmanM.D.   On: 12/14/2022 16:27    Procedures Procedures    Medications Ordered in ED Medications  aspirin chewable tablet 81 mg (has no administration in time range)  donepezil (ARICEPT) tablet 10 mg (has no administration in time range)  levothyroxine (SYNTHROID) tablet 50 mcg (has no administration in time range)  memantine (NAMENDA) tablet 10 mg (has no administration in time range)  metoprolol succinate (TOPROL-XL) 24 hr tablet 25 mg (has no administration in time range)  montelukast (SINGULAIR) tablet 10 mg (has no administration in time range)  pantoprazole (PROTONIX) EC tablet 40 mg (has no administration in time range)  oxybutynin (DITROPAN-XL) 24 hr tablet 10 mg (has no administration in time range)  sertraline (ZOLOFT) tablet 50 mg (has no administration in time range)  simvastatin (ZOCOR)  tablet 20 mg (has no administration in time range)  tiotropium (SPIRIVA) inhalation capsule ([facility] use ONLY) 18 mcg (has no administration in time range)  sodium chloride 0.9 % bolus 1,000 mL (1,000 mLs Intravenous New Bag/Given 12/14/22 1329)  ondansetron (ZOFRAN) injection 4 mg (4 mg Intravenous Given 12/14/22 1330)  iohexol (OMNIPAQUE) 300 MG/ML solution 75 mL (75 mLs Intravenous Contrast Given 12/14/22 1546)    ED Course/ Medical Decision Making/ A&P Clinical Course as of 12/14/22 1747  Wed Dec 14, 2022  1441 Patient is hungry and thirsty and repeatedly requesting food and drink.  We can try a p.o. challenge with oral intake  now [MT]    Clinical Course User Index [MT] Elorah Dewing, Carola Rhine, MD                             Medical Decision Making Amount and/or Complexity of Data Reviewed Labs: ordered. Radiology: ordered.  Risk OTC drugs. Prescription drug management.   This patient presents to the ED with concern for generalized weakness, poor oral intake, diarrhea. This involves an extensive number of treatment options, and is a complaint that carries with it a high risk of complications and morbidity.  The differential diagnosis includes viral illness versus other type of infection versus intra-abdominal pathology  Co-morbidities that complicate the patient evaluation: Age and dementia  Additional history obtained from EMS and the patient's son by phone  I ordered and personally interpreted labs.  The pertinent results include: Patient has minor elevation of BUN and creatinine.  Anion gap is 16.  She has a leukocytosis white blood cell count of 18,000 which appears to be chronic, she has frequently elevated in the past.  Lipase within normal limits.  She is afebrile in the ED.  No localizing source of infection at this time, though we are awaiting UA.  No indication for x-ray imaging of the chest.  I ordered imaging studies including CT abdomen pelvis, right upper quadrant [place] I  independently visualized and interpreted imaging which showed normal gallbladder ultrasound.  CT scan showing some questionable gallbladder dilatation but no other acute emergent findings I agree with the radiologist interpretation  Patient is presentation I have a lower suspicion for acute biliary disease as a cause of patient's symptoms.  She has no transaminitis.  The patient was maintained on a cardiac monitor.  I personally viewed and interpreted the cardiac monitored which showed an underlying rhythm of: Sinus rhythm  I ordered medication including IV fluids for hydration.  Home medications reordered  I have reviewed the patients home medicines and have made adjustments as needed   After the interventions noted above, I reevaluated the patient and found that they have: stayed the same  Dispostion:  Patient is signed out to Dr Maylon Peppers EDP pending UA  After discussion with the patient's family member, her son, who has been her primary caretaker, it is clear that the patient does not have a safe plan for discharge home.  We are awaiting a UA sample.  If she is positive for UTI I do think it be reasonable to admit the patient antibiotics for this acute change in her mental status and her new fatigue.  If there are no acute findings, she would still benefit from PT evaluation ED, possible Geri psych evaluation for worsening dementia (?) --And social work involvement for placement.         Final Clinical Impression(s) / ED Diagnoses Final diagnoses:  None    Rx / DC Orders ED Discharge Orders     None         Erique Kaser, Carola Rhine, MD 12/14/22 1747

## 2022-12-14 NOTE — ED Notes (Signed)
Pt to ct via stretcher

## 2022-12-14 NOTE — Progress Notes (Signed)
Transition of Care [facility]) - Emergency Department Mini Assessment   Patient Details  Name: LAVREN CHIAPPONE MRN: [id number] Date of Birth: [birth year]  Transition of Care [facility]) CM/SW Contact:    Rodney Booze, LCSW Phone Number: 12/14/2022, 7:53 PM   Clinical Narrative: CSW called the patients phone and the patient's son answered. The patient has a history of Dementia. CSW explained the SNF process to the son. This CSW also gave the son Medicare.gov resources to start reviewing facilities. The son did state that he would prefer [facility] however CSW did explain that TOC had no control over bed offers this CSW will note families preference for facility. CSW explained that PT will still need to see the patient. CSW made the son aware that the AM CSW will reach out to him once PT has reached out and bed offers were available to present. TOC will continue to follow.     ED Mini Assessment: What brought you to the Emergency Department? : (P) EMS brought from  home for N/V/D for 3 days. Generalized abdominal pain. Hx of dementia  Barriers to Discharge: No Barriers Identified  Barrier interventions: (P) N/A     Interventions which prevented an admission or readmission: SNF Placement    Patient Contact and Communications     Spoke with: (P) Son Contact Date: (P) 12/14/22,   Contact time: (P) 1955   Call outcome: (P) Son would like SNF and prefers [facility]  Patient states their goals for this hospitalization and ongoing recovery are:: (P) SNF CMS Medicare.gov Compare Post Acute Care list provided to:: (P) Patient Choice offered to / list presented to : (P) Patient  Admission diagnosis:  N/V/D Patient Active Problem List   Diagnosis Date Noted   DKA (diabetic ketoacidoses) 08/08/2018   Sepsis ([place]) 08/08/2018   AKI (acute kidney injury) ([place]) 0000000   Acute metabolic encephalopathy 0000000   COPD (chronic obstructive pulmonary disease) ([place]) 08/08/2018   HTN  (hypertension) 08/08/2018   Dementia ([place]) 08/08/2018   Overactive bladder 08/08/2018   Physical deconditioning 08/08/2018   History of hyperlipidemia 12/26/2017   History of atrial fibrillation 12/26/2017   History of diabetes mellitus 12/26/2017   History of hypothyroidism 12/26/2017   History of sleep apnea 12/26/2017   History of gastroesophageal reflux (GERD) 12/26/2017   History of asthma 12/26/2017   On prednisone therapy 05/04/2017   Chronic left shoulder pain 01/12/2017   Hx of cardiac cath 12/18/2016   Chronic pain of right knee 12/07/2016   Primary osteoarthritis of both hands 12/07/2016   Chronic right shoulder pain 12/07/2016   History of osteopenia 12/07/2016   History of total knee replacement, left 12/07/2016   Atrial fibrillation ([place]) 10/24/2016   OSA (obstructive sleep apnea) 05/15/2008   Hypothyroidism 04/03/2007   Diabetes mellitus ([place]) 04/03/2007   HLD (hyperlipidemia) 04/03/2007   Asthma 04/03/2007   GERD 04/03/2007   Primary osteoarthritis of right knee 04/03/2007   DDD (degenerative disc disease), lumbar 04/03/2007   DDD (degenerative disc disease), cervical 04/03/2007   Fibromyalgia 04/03/2007   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

## 2022-12-14 NOTE — ED Notes (Signed)
Applied skin barrier to pt sacrum and turned her onto her right side to prevent pressure ulcers

## 2022-12-14 NOTE — ED Provider Notes (Signed)
Patient signed out to me at 1700 by Dr. Langston Masker pending urine and right upper quadrant ultrasound.  In short this is an 82 year old female with a past medical history of dementia, diabetes and hypertension that presented to the emergency department with nausea and generalized abdominal pain.  She had no complaints on arrival here.  Workup here does show evidence of a leukocytosis though is chronic on her prior labs and some mild dehydration with an anion gap of 16 and mildly elevated creatinine of 1.3.  She did receive IV fluids and is tolerating p.o.  On CTAP showed mild ductal dilation and gallbladder dilation.  She has normal LFTs.  Patient is undergoing ultrasound to further evaluate and will additionally have UA to evaluate for causes of her abdominal pain.  If these are normal, she will likely be stable for discharge back to her facility.  Patient's urine and viral swab are within normal range.  Dr. Langston Masker did speak with the patient's family who states that she currently lives at home alone and is unsafe to return to home as he is unable to care with her. He reported she is often found covered in stool. TOC will be consulted for safe disposition.   Katherine Durie, DO 12/14/22 1921

## 2022-12-14 NOTE — ED Triage Notes (Signed)
BIB EMS from home for N/V/D for 3 days. Generalized abdominal pain. Hx of dementia

## 2022-12-15 MED ORDER — IBUPROFEN 800 MG PO TABS
800.0000 mg | ORAL_TABLET | Freq: Once | ORAL | Status: AC
  Administered 2022-12-15: 800 mg via ORAL
  Filled 2022-12-15: qty 1

## 2022-12-15 NOTE — Discharge Instructions (Addendum)
Skin breakdown in the rectal area and low back will need to be treated with barrier cream tid and frequent repositioning.

## 2022-12-15 NOTE — Progress Notes (Signed)
TOC CSW spoke with pts son, Labrisha Edgett [phone].  CSW spoke with Randall Hiss about getting pt Medicaid for LTC.  CSW let Randall Hiss know she would be passing his information to [place] [facility] so she can assist with pts next possible steps.  Danai Gotto Tarpley-Carter, MSW, LCSW-A Pronouns:  She/Her/Hers [facility] Clinical Social Worker Direct Number:  [phone] [email or web address]

## 2022-12-15 NOTE — ED Provider Notes (Signed)
Emergency Medicine Observation Re-evaluation Note  [name] is a 82 y.o. female, seen on rounds today.  Pt initially presented to the ED for complaints of Abdominal Pain and Nausea Currently, the patient is lying in bed states her rectum feels like it is on fire.  Physical Exam  BP (!) 104/44   Pulse 75   Temp 97.6 F (36.4 C) (Oral)   Resp 13   Ht 5' 2.5" (1.588 m)   Wt 81.6 kg   SpO2 99%   BMI 32.40 kg/m  Physical Exam General: NAD Cardiac: rrr Lungs: clear Skin:  buttocks /rectal area with skin break down and also on the low back.  No fluctuance or induration.  No signs of abscess.  ED Course / MDM  EKG:   I have reviewed the labs performed to date as well as medications administered while in observation.  Recent changes in the last 24 hours include eval in ER yesterday without acute findings.  Plan  Current plan is for [facility] placement.    Blanchie Dessert, MD 12/15/22 1259

## 2022-12-15 NOTE — ED Notes (Signed)
Report called, transport called via Education officer, museum

## 2022-12-15 NOTE — Progress Notes (Addendum)
This CSW presented bed offers to pt's son, Randall Hiss, who has selected [facility] at this time. TOC to initiate Auth.   Addend @ 10:43 AM Auth initiated.  Addend @ 12:27 PM Auth approved. Nikki at [facility] to outreach to pt's son for him to complete admission paperwork. EDP and RN notified via secure chat. PTAR to transport.

## 2022-12-15 NOTE — NC FL2 (Signed)
[place] LEVEL OF CARE FORM     IDENTIFICATION  Patient Name: [name] Birthdate: 06-09-1941 Sex: female Admission Date (Current Location): 12/14/2022  [facility] and [place] Number:  Herbalist and Address:  [facility],  [address] [place], [place]      Provider Number: [id number]  Attending Physician Name and Address:  Default, Provider, MD  Relative Name and Phone Number:  Randall Hiss (son) - [phone]    Current Level of Care: Hospital Recommended Level of Care: [place] Prior Approval Number:    Date Approved/Denied:   PASRR Number: [id number]  Discharge Plan: SNF    Current Diagnoses: Patient Active Problem List   Diagnosis Date Noted   DKA (diabetic ketoacidoses) 08/08/2018   Sepsis ([place]) 08/08/2018   AKI (acute kidney injury) ([place]) 0000000   Acute metabolic encephalopathy 0000000   COPD (chronic obstructive pulmonary disease) ([place]) 08/08/2018   HTN (hypertension) 08/08/2018   Dementia ([place]) 08/08/2018   Overactive bladder 08/08/2018   Physical deconditioning 08/08/2018   History of hyperlipidemia 12/26/2017   History of atrial fibrillation 12/26/2017   History of diabetes mellitus 12/26/2017   History of hypothyroidism 12/26/2017   History of sleep apnea 12/26/2017   History of gastroesophageal reflux (GERD) 12/26/2017   History of asthma 12/26/2017   On prednisone therapy 05/04/2017   Chronic left shoulder pain 01/12/2017   Hx of cardiac cath 12/18/2016   Chronic pain of right knee 12/07/2016   Primary osteoarthritis of both hands 12/07/2016   Chronic right shoulder pain 12/07/2016   History of osteopenia 12/07/2016   History of total knee replacement, left 12/07/2016   Atrial fibrillation ([place]) 10/24/2016   OSA (obstructive sleep apnea) 05/15/2008   Hypothyroidism 04/03/2007   Diabetes mellitus ([place]) 04/03/2007   HLD (hyperlipidemia) 04/03/2007   Asthma 04/03/2007   GERD  04/03/2007   Primary osteoarthritis of right knee 04/03/2007   DDD (degenerative disc disease), lumbar 04/03/2007   DDD (degenerative disc disease), cervical 04/03/2007   Fibromyalgia 04/03/2007   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   TACHYCARDIA, HX OF 04/03/2007   STATUS, KNEE JOINT REPLACEMENT 04/03/2007    Orientation RESPIRATION BLADDER Height & Weight     Self  Normal Incontinent Weight: 180 lb (81.6 kg) Height:  5' 2.5" (158.8 cm)  BEHAVIORAL SYMPTOMS/MOOD NEUROLOGICAL BOWEL NUTRITION STATUS      Incontinent Diet (Regular)  AMBULATORY STATUS COMMUNICATION OF NEEDS Skin   Extensive Assist Verbally Normal                       Personal Care Assistance Level of Assistance  Bathing, Feeding, Dressing Bathing Assistance: Limited assistance Feeding assistance: Limited assistance Dressing Assistance: Limited assistance     Functional Limitations Info  Sight, Hearing, Speech Sight Info: Adequate Hearing Info: Adequate Speech Info: Adequate    SPECIAL CARE FACTORS FREQUENCY  PT (By licensed PT), OT (By licensed OT)     PT Frequency: x5/week OT Frequency: x5/week            Contractures      Additional Factors Info  Code Status, Allergies, Psychotropic Code Status Info: Full Allergies Info: Codeine  Darvon (Propoxyphene Hcl)  Fentanyl  Oxycodone Hcl  Propoxyphene N-acetaminophen  Valium (Diazepam)  Valsartan Psychotropic Info: Zoloft         Current Medications (12/15/2022):  This is the current hospital active medication list Current Facility-Administered Medications  Medication Dose Route Frequency Provider Last Rate Last Admin   [  START ON 12/16/2022] aspirin chewable tablet 81 mg  81 mg Oral Q M,W,F Kingsley, Victoria K, DO       donepezil (ARICEPT) tablet 10 mg  10 mg Oral QHS Wyvonnia Dusky, MD   10 mg at 12/14/22 2214   levothyroxine (SYNTHROID) tablet 50 mcg  50 mcg Oral QAC breakfast Leanord Asal K, DO   50 mcg at 12/15/22 0651   memantine  (NAMENDA) tablet 10 mg  10 mg Oral QHS Wyvonnia Dusky, MD   10 mg at 12/14/22 2214   metoprolol succinate (TOPROL-XL) 24 hr tablet 25 mg  25 mg Oral Daily Kingsley, Victoria K, DO       montelukast (SINGULAIR) tablet 10 mg  10 mg Oral Daily Leanord Asal K, DO       oxybutynin (DITROPAN-XL) 24 hr tablet 10 mg  10 mg Oral Daily Trifan, Carola Rhine, MD       pantoprazole (PROTONIX) EC tablet 40 mg  40 mg Oral Daily Wyvonnia Dusky, MD   40 mg at 12/14/22 1917   sertraline (ZOLOFT) tablet 50 mg  50 mg Oral QHS Wyvonnia Dusky, MD   50 mg at 12/14/22 2214   simvastatin (ZOCOR) tablet 20 mg  20 mg Oral Daily Kingsley, Victoria K, DO       umeclidinium bromide (INCRUSE ELLIPTA) 62.5 MCG/ACT 1 puff  1 puff Inhalation Daily Kemper Durie, DO       Current Outpatient Medications  Medication Sig Dispense Refill   acetaminophen (TYLENOL) 500 MG tablet Take 1,000 mg by mouth daily.     ammonium lactate (AMLACTIN) 12 % cream Apply topically 2 (two) times daily. to affected area (Patient not taking: Reported on 07/05/2020)     aspirin 81 MG chewable tablet Chew 81 mg by mouth See admin instructions. Monday Wednesday Friday     blood glucose meter kit and supplies KIT Dispense based on patient and insurance preference. Use up to four times daily as directed. (FOR ICD-9 250.00, 250.01). 1 each 0   donepezil (ARICEPT) 10 MG tablet Take 10 mg by mouth at bedtime.     Lancets (ONETOUCH ULTRASOFT) lancets      levothyroxine (SYNTHROID, LEVOTHROID) 50 MCG tablet Take 50 mcg by mouth daily before breakfast.  0   meclizine (ANTIVERT) 12.5 MG tablet Take 12.5 mg by mouth 3 (three) times daily as needed for dizziness.     memantine (NAMENDA) 10 MG tablet Take 10 mg by mouth at bedtime.     metoprolol succinate (TOPROL-XL) 25 MG 24 hr tablet Take 25 mg by mouth daily.  0   montelukast (SINGULAIR) 10 MG tablet Take 10 mg by mouth daily.     naproxen sodium (ALEVE) 220 MG tablet Take 440 mg by mouth daily.      omeprazole (PRILOSEC) 40 MG capsule Take 40 mg by mouth daily.  3   ONE TOUCH ULTRA TEST test strip      oxybutynin (DITROPAN-XL) 10 MG 24 hr tablet Take 10 mg by mouth daily.      RA VITAMIN D-3 2000 units CAPS Take 1 capsule by mouth daily.  0   sertraline (ZOLOFT) 50 MG tablet Take 50 mg by mouth at bedtime.     simvastatin (ZOCOR) 20 MG tablet Take 20 mg by mouth daily.      TAZTIA XT 120 MG 24 hr capsule Take 120 mg by mouth daily.  0   tiotropium (SPIRIVA) 18 MCG inhalation capsule Place 18 mcg  into inhaler and inhale daily.     TURMERIC PO Take 1 tablet by mouth daily.        Discharge Medications: Please see discharge summary for a list of discharge medications.  Relevant Imaging Results:  Relevant Lab Results:   Additional Information SSN: [id number]  Kimber Relic, LCSW

## 2022-12-15 NOTE — Evaluation (Signed)
Physical Therapy Evaluation Patient Details Name: [name] MRN: [id number] DOB: 1941-04-15 Today's Date: 12/15/2022  History of Present Illness  [name] is a 82 y.o. female with a history of dementia, DM, HTN, fibromyalgia, RA, COPD, LTKA, presenting 12/14/22 from home with nausea vomiting and diarrhea and abdominal pain  for approximately 3 days per son with whom she lives.  Clinical Impression  Pt admitted with above diagnosis.  Pt currently with functional limitations due to the deficits listed below (see PT Problem List). Pt will benefit from skilled PT to increase their independence and safety with mobility to allow discharge to the venue listed below.   The patient is very pleasant, oriented to self and hospital, reports that she  lives alone.  Patient complains of  pain in buttocks, attempted repositioning on stretcher. Patient required min assist to sit up and  stand and take  several side steps using RW.  No family present. Chart notes indicate patient ambulatory at baseline until recent decline in function.          Recommendations for follow up therapy are one component of a multi-disciplinary discharge planning process, led by the attending physician.  Recommendations may be updated based on patient status, additional functional criteria and insurance authorization.  Follow Up Recommendations Skilled nursing-short term rehab (<3 hours/day) Can patient physically be transported by private vehicle: No    Assistance Recommended at Discharge Frequent or constant Supervision/Assistance  Patient can return home with the following  Assistance with cooking/housework;A little help with bathing/dressing/bathroom;A lot of help with walking and/or transfers;Direct supervision/assist for medications management    Equipment Recommendations None recommended by PT  Recommendations for Other Services       Functional Status Assessment Patient has had a recent decline  in their functional status and demonstrates the ability to make significant improvements in function in a reasonable and predictable amount of time.     Precautions / Restrictions Precautions Precautions: Fall Precaution Comments: ? incontinent      Mobility  Bed Mobility Overal bed mobility: Needs Assistance Bed Mobility: Rolling, Supine to Sit, Sit to Supine Rolling: Min guard   Supine to sit: Min assist Sit to supine: Min assist   General bed mobility comments: assist with legs and trunk    Transfers Overall transfer level: Needs assistance Equipment used: Rolling walker (2 wheels) Transfers: Sit to/from Stand Sit to Stand: Min assist           General transfer comment: able to step x 4 along bed, antalgic on right leg    Ambulation/Gait                  Stairs            Wheelchair Mobility    Modified Rankin (Stroke Patients Only)       Balance Overall balance assessment: Needs assistance Sitting-balance support: Bilateral upper extremity supported, Feet supported Sitting balance-Leahy Scale: Fair     Standing balance support: Bilateral upper extremity supported, During functional activity, Reliant on assistive device for balance Standing balance-Leahy Scale: Poor                               Pertinent Vitals/Pain Pain Assessment Pain Assessment: Faces Faces Pain Scale: Hurts worst Pain Location: buttocks(excoriated) and right knee Pain Descriptors / Indicators: Burning, Discomfort, Moaning Pain Intervention(s): Limited activity within patient's tolerance, Monitored during session    Home Living Family/patient  expects to be discharged to:: Private residence Living Arrangements: Children Available Help at Discharge: Family;Available 24 hours/day Type of Home: House Home Access: Stairs to enter   [employer] of Steps: unsure,     Home Equipment: Conservation officer, nature (2 wheels) Additional Comments: no family  present, per chart per son, patient ambulatory with RW, son takes care of patient, son also cares for bedridden father.    Prior Function Prior Level of Function : Patient poor historian/Family not available                     Hand Dominance   Dominant Hand: Right    Extremity/Trunk Assessment   Upper Extremity Assessment Upper Extremity Assessment: Generalized weakness    Lower Extremity Assessment Lower Extremity Assessment: Generalized weakness    Cervical / Trunk Assessment Cervical / Trunk Assessment: Normal  Communication   Communication: No difficulties  Cognition Arousal/Alertness: Awake/alert Behavior During Therapy: WFL for tasks assessed/performed Overall Cognitive Status: No family/caregiver present to determine baseline cognitive functioning Area of Impairment: Orientation                 Orientation Level: Time             General Comments: oriented to [place], states  that she lives alone        General Comments      Exercises     Assessment/Plan    PT Assessment Patient needs continued PT services  PT Problem List Decreased strength;Decreased range of motion;Decreased cognition;Decreased activity tolerance;Decreased balance;Decreased safety awareness;Pain       PT Treatment Interventions DME instruction;Therapeutic exercise;Gait training;Functional mobility training;Cognitive remediation;Therapeutic activities    PT Goals (Current goals can be found in the Care Plan section)  Acute Rehab PT Goals Patient Stated Goal: to go home PT Goal Formulation: Patient unable to participate in goal setting Time For Goal Achievement: 12/29/22 Potential to Achieve Goals: Good    Frequency Min 2X/week     Co-evaluation               AM-PAC PT "6 Clicks" Mobility  Outcome Measure Help needed turning from your back to your side while in a flat bed without using bedrails?: A Little Help needed moving from lying on your back  to sitting on the side of a flat bed without using bedrails?: A Little Help needed moving to and from a bed to a chair (including a wheelchair)?: A Little Help needed standing up from a chair using your arms (e.g., wheelchair or bedside chair)?: A Lot Help needed to walk in hospital room?: Total Help needed climbing 3-5 steps with a railing? : Total 6 Click Score: 13    End of Session   Activity Tolerance: Patient tolerated treatment well Patient left: in bed;with call bell/phone within reach Nurse Communication: Mobility status PT Visit Diagnosis: Unsteadiness on feet (R26.81);Difficulty in walking, not elsewhere classified (R26.2);Pain    Time: [id number] PT Time Calculation (min) (ACUTE ONLY): 26 min   Charges:   PT Evaluation $PT Eval Low Complexity: 1 Low PT Treatments $Therapeutic Activity: 8-22 mins        Tresa Endo PT Acute Rehabilitation Services Office [phone] Weekend [id number]   Claretha Cooper 12/15/2022, 8:49 AM

## 2022-12-15 NOTE — Progress Notes (Signed)
Awaiting PT.  

## 2024-03-05 ENCOUNTER — Emergency Department (HOSPITAL_COMMUNITY)

## 2024-03-05 ENCOUNTER — Encounter (HOSPITAL_COMMUNITY): Payer: Self-pay

## 2024-03-05 ENCOUNTER — Emergency Department (HOSPITAL_COMMUNITY)
Admission: EM | Admit: 2024-03-05 | Discharge: 2024-03-05 | Disposition: A | Discharge status: Home / Self Care | Attending: Emergency Medicine | Admitting: Emergency Medicine

## 2024-03-05 ENCOUNTER — Other Ambulatory Visit: Payer: Self-pay

## 2024-03-05 DIAGNOSIS — J449 Chronic obstructive pulmonary disease, unspecified: Secondary | ICD-10-CM | POA: Insufficient documentation

## 2024-03-05 DIAGNOSIS — S40011A Contusion of right shoulder, initial encounter: Secondary | ICD-10-CM | POA: Insufficient documentation

## 2024-03-05 DIAGNOSIS — F0390 Unspecified dementia without behavioral disturbance: Secondary | ICD-10-CM | POA: Diagnosis not present

## 2024-03-05 DIAGNOSIS — Y92009 Unspecified place in unspecified non-institutional (private) residence as the place of occurrence of the external cause: Secondary | ICD-10-CM | POA: Diagnosis not present

## 2024-03-05 DIAGNOSIS — Z7982 Long term (current) use of aspirin: Secondary | ICD-10-CM | POA: Diagnosis not present

## 2024-03-05 DIAGNOSIS — M25511 Pain in right shoulder: Secondary | ICD-10-CM | POA: Diagnosis present

## 2024-03-05 DIAGNOSIS — E119 Type 2 diabetes mellitus without complications: Secondary | ICD-10-CM | POA: Insufficient documentation

## 2024-03-05 DIAGNOSIS — W08XXXA Fall from other furniture, initial encounter: Secondary | ICD-10-CM | POA: Insufficient documentation

## 2024-03-05 DIAGNOSIS — W19XXXA Unspecified fall, initial encounter: Secondary | ICD-10-CM

## 2024-03-05 DIAGNOSIS — I10 Essential (primary) hypertension: Secondary | ICD-10-CM | POA: Insufficient documentation

## 2024-03-05 LAB — CBC WITH DIFFERENTIAL/PLATELET
Abs Immature Granulocytes: 0.04 10*3/uL (ref 0.00–0.07)
Basophils Absolute: 0.1 10*3/uL (ref 0.0–0.1)
Basophils Relative: 0 %
Eosinophils Absolute: 0.1 10*3/uL (ref 0.0–0.5)
Eosinophils Relative: 1 %
HCT: 37.7 % (ref 36.0–46.0)
Hemoglobin: 11.8 g/dL — ABNORMAL LOW (ref 12.0–15.0)
Immature Granulocytes: 0 %
Lymphocytes Relative: 26 %
Lymphs Abs: 3.4 10*3/uL (ref 0.7–4.0)
MCH: 26.2 pg (ref 26.0–34.0)
MCHC: 31.3 g/dL (ref 30.0–36.0)
MCV: 83.8 fL (ref 80.0–100.0)
Monocytes Absolute: 0.7 10*3/uL (ref 0.1–1.0)
Monocytes Relative: 6 %
Neutro Abs: 9 10*3/uL — ABNORMAL HIGH (ref 1.7–7.7)
Neutrophils Relative %: 67 %
Platelets: 283 10*3/uL (ref 150–400)
RBC: 4.5 MIL/uL (ref 3.87–5.11)
RDW: 16.3 % — ABNORMAL HIGH (ref 11.5–15.5)
WBC: 13.3 10*3/uL — ABNORMAL HIGH (ref 4.0–10.5)
nRBC: 0 % (ref 0.0–0.2)

## 2024-03-05 LAB — COMPREHENSIVE METABOLIC PANEL WITH GFR
ALT: 14 U/L (ref 0–44)
AST: 17 U/L (ref 15–41)
Albumin: 3.6 g/dL (ref 3.5–5.0)
Alkaline Phosphatase: 73 U/L (ref 38–126)
Anion gap: 11 (ref 5–15)
BUN: 20 mg/dL (ref 8–23)
CO2: 24 mmol/L (ref 22–32)
Calcium: 9.3 mg/dL (ref 8.9–10.3)
Chloride: 105 mmol/L (ref 98–111)
Creatinine, Ser: 1.13 mg/dL — ABNORMAL HIGH (ref 0.44–1.00)
GFR, Estimated: 48 mL/min — ABNORMAL LOW (ref >60)
Glucose, Bld: 94 mg/dL (ref 70–99)
Potassium: 4 mmol/L (ref 3.5–5.1)
Sodium: 140 mmol/L (ref 135–145)
Total Bilirubin: 0.4 mg/dL (ref 0.0–1.2)
Total Protein: 7.1 g/dL (ref 6.5–8.1)

## 2024-03-05 NOTE — ED Provider Notes (Addendum)
 [facility] AT [facility] Provider Note   CSN: [id number] Arrival date & time: 03/05/24  1157     History  Chief Complaint  Patient presents with   [name] is a 83 y.o. female.  Patient is here after she rolled off her couch at home.  She states that she was sleeping watching TV rolled over on the couch.  Landed on her right side.  Pain mostly to the right shoulder.  She has history of memory issues.  She lives by herself.  Son checks on her daily.  She has a history of COPD diabetes hypertension.  She is not on any blood thinners.  She remembers waking up in her normal state of health.  She has no complaints at this time other than the right shoulder pain.  The history is provided by the patient.       Home Medications Prior to Admission medications   Medication Sig Start Date End Date Taking? Authorizing Provider  acetaminophen  (TYLENOL ) 500 MG tablet Take 1,000 mg by mouth daily.    [provider]  ammonium lactate (AMLACTIN) 12 % cream Apply topically 2 (two) times daily. to affected area Patient not taking: Reported on 07/05/2020 02/11/20   [provider]  aspirin  81 MG chewable tablet Chew 81 mg by mouth See admin instructions. Monday Wednesday Friday    [provider]  blood glucose meter kit and supplies KIT Dispense based on patient and insurance preference. Use up to four times daily as directed. (FOR ICD-9 250.00, 250.01). 08/14/18   Aura Leeds Latif, DO  donepezil  (ARICEPT ) 10 MG tablet Take 10 mg by mouth at bedtime. 02/10/20   [provider]  Lancets Emmett Harman ULTRASOFT) lancets  09/17/16   [provider]  levothyroxine  (SYNTHROID , LEVOTHROID) 50 MCG tablet Take 50 mcg by mouth daily before breakfast. 11/23/16   [provider]  meclizine (ANTIVERT) 12.5 MG tablet Take 12.5 mg by mouth 3 (three) times daily as needed for dizziness. 06/12/16   [provider]  memantine  (NAMENDA ) 10 MG tablet Take 10 mg by mouth at bedtime. 04/24/20   [provider]  metoprolol  succinate (TOPROL -XL) 25 MG 24 hr tablet Take 25 mg by mouth daily. 12/07/16   [provider]  montelukast  (SINGULAIR ) 10 MG tablet Take 10 mg by mouth daily. 07/28/16   [provider]  naproxen sodium (ALEVE) 220 MG tablet Take 440 mg by mouth daily.    [provider]  omeprazole (PRILOSEC) 40 MG capsule Take 40 mg by mouth daily. 12/01/17   [provider]  ONE TOUCH ULTRA TEST test strip  09/17/16   [provider]  oxybutynin  (DITROPAN -XL) 10 MG 24 hr tablet Take 10 mg by mouth daily.  12/28/16   [provider]  RA VITAMIN D -3 2000 units CAPS Take 1 capsule by mouth daily. 09/15/16   [provider]  sertraline  (ZOLOFT ) 50 MG tablet Take 50 mg by mouth at bedtime. 06/06/20   [provider]  simvastatin  (ZOCOR ) 20 MG tablet Take 20 mg by mouth daily.     [provider]  TAZTIA  XT 120 MG 24 hr capsule Take 120 mg by mouth daily. 12/07/16   [provider]  tiotropium (SPIRIVA ) 18 MCG inhalation capsule Place 18 mcg into inhaler and inhale daily.    [provider]  TURMERIC PO Take 1 tablet by mouth daily.     [provider]      Allergies    Codeine, Darvon [propoxyphene hcl], Fentanyl, Oxycodone hcl, Propoxyphene n-acetaminophen , Valium [diazepam], and Valsartan    Review of Systems   Review of Systems  Physical Exam Updated Vital Signs BP (!) 135/52 (BP Location: Left Arm)   Pulse (!) 53   Temp (!) 97.2 F (36.2 C) (Oral)   Resp 15   Ht 5\' 2"  (1.575 m)   Wt 79.4 kg   SpO2 100%   BMI 32.01 kg/m  Physical Exam Vitals and nursing note reviewed.  Constitutional:      General: She is not in acute distress.    Appearance: She is well-developed. She is not ill-appearing.  HENT:     Head: Normocephalic and atraumatic.     Nose: Nose normal.     Mouth/Throat:      Mouth: Mucous membranes are moist.  Eyes:     Extraocular Movements: Extraocular movements intact.     Conjunctiva/sclera: Conjunctivae normal.     Pupils: Pupils are equal, round, and reactive to light.  Cardiovascular:     Rate and Rhythm: Normal rate and regular rhythm.     Pulses: Normal pulses.     Heart sounds: Normal heart sounds. No murmur heard. Pulmonary:     Effort: Pulmonary effort is normal. No respiratory distress.     Breath sounds: Normal breath sounds.  Abdominal:     General: Abdomen is flat.     Palpations: Abdomen is soft.     Tenderness: There is no abdominal tenderness.  Musculoskeletal:        General: Tenderness present. No swelling.     Cervical back: Normal range of motion and neck supple.     Comments: Tenderness to right shoulder, right elbow  Skin:    General: Skin is warm and dry.     Capillary Refill: Capillary refill takes less than 2 seconds.  Neurological:     General: No focal deficit present.     Mental Status: She is alert and oriented to person, place, and time.     Cranial Nerves: No cranial nerve deficit.     Sensory: No sensory deficit.     Motor: No weakness.     Coordination: Coordination normal.  Psychiatric:        Mood and Affect: Mood normal.     ED Results / Procedures / Treatments   Labs (all labs ordered are listed, but only abnormal results are displayed) Labs Reviewed  COMPREHENSIVE METABOLIC PANEL WITH GFR - Abnormal; Notable for the following components:      Result Value   Creatinine, Ser 1.13 (*)    GFR, Estimated 48 (*)    All other components within normal limits  CBC WITH DIFFERENTIAL/PLATELET - Abnormal; Notable for the following components:   WBC 13.3 (*)    Hemoglobin 11.8 (*)    RDW 16.3 (*)    Neutro Abs 9.0 (*)    All other components within normal limits  CBC WITH DIFFERENTIAL/PLATELET    EKG EKG Interpretation Date/Time:  Tuesday March 05 2024 12:59:42 EDT Ventricular Rate:  54 PR  Interval:  219 QRS Duration:  88 QT Interval:  418 QTC Calculation: 397 R Axis:   36  Text Interpretation: Sinus rhythm Borderline prolonged PR interval Low voltage, precordial leads Confirmed by Lowery Rue [phone]) on 03/05/2024 1:07:25 PM  Radiology DG Chest 2 View Result Date: 03/05/2024 CLINICAL DATA:  Pain, fall. EXAM: CHEST - 2 VIEW COMPARISON:  Radiograph  07/05/2020 FINDINGS: The cardiomediastinal contours are normal. A skin fold projects over the left lower hemithorax. Pulmonary vasculature is normal. No consolidation, pleural effusion, or pneumothorax. No acute osseous abnormalities are seen. Degenerative change of both shoulders. IMPRESSION: No acute chest findings. Electronically Signed   By: Chadwick Colonel M.D.   On: 03/05/2024 15:39   DG Elbow Complete Right Result Date: 03/05/2024 CLINICAL DATA:  Pain after fall. EXAM: RIGHT ELBOW - COMPLETE 3+ VIEW COMPARISON:  None Available. FINDINGS: Spurring of the radial head neck is felt to be degenerative. There is also degenerative spurring of the olecranon and distal humerus. No definite acute fracture. There may be a small joint effusion. Corticated density adjacent to the lateral humeral epicondyle has a chronic appearance. IMPRESSION: 1. No definite acute fracture. Prominent degenerative spurring. If there is persistent clinical concern for fracture, recommend CT. 2. Possible small joint effusion. Electronically Signed   By: Chadwick Colonel M.D.   On: 03/05/2024 15:38   DG Pelvis 1-2 Views Result Date: 03/05/2024 CLINICAL DATA:  Pain after fall. EXAM: PELVIS - 1-2 VIEW COMPARISON:  None Available. FINDINGS: The cortical margins of the bony pelvis are intact. No fracture. Pubic symphysis and sacroiliac joints are congruent. Both femoral heads are well-seated in the respective acetabula. Calcified uterine fibroids. IMPRESSION: No pelvic fracture. Electronically Signed   By: Chadwick Colonel M.D.   On: 03/05/2024 15:27   DG Shoulder  Right Result Date: 03/05/2024 CLINICAL DATA:  Pain after fall. EXAM: RIGHT SHOULDER - 2+ VIEW COMPARISON:  None Available. FINDINGS: No evidence of acute fracture. Superior subluxation of the humeral head suggestive of chronic rotator cuff arthropathy. Prominent glenohumeral degenerative change with joint space narrowing, spurring, subchondral cysts and sclerosis. Mild acromioclavicular degenerative change. Soft tissue calcification adjacent to the lateral humeral head. Included ribs are intact. IMPRESSION: 1. No acute fracture or dislocation of the right shoulder. 2. Superior subluxation of the humeral head suggestive of chronic rotator cuff arthropathy. 3. Prominent glenohumeral degenerative change. Electronically Signed   By: Chadwick Colonel M.D.   On: 03/05/2024 15:26   CT Cervical Spine Wo Contrast Result Date: 03/05/2024 CLINICAL DATA:  Polytrauma, blunt.  Fall. EXAM: CT CERVICAL SPINE WITHOUT CONTRAST TECHNIQUE: Multidetector CT imaging of the cervical spine was performed without intravenous contrast. Multiplanar CT image reconstructions were also generated. RADIATION DOSE REDUCTION: This exam was performed according to the departmental dose-optimization program which includes automated exposure control, adjustment of the mA and/or kV according to patient size and/or use of iterative reconstruction technique. COMPARISON:  None Available. FINDINGS: Alignment: Normal Skull base and vertebrae: No acute fracture. No primary bone lesion or focal pathologic process. Soft tissues and spinal canal: No prevertebral fluid or swelling. No visible canal hematoma. Disc levels: Diffuse degenerative disc disease with disc space narrowing and spurring. Bilateral degenerative facet disease, left greater than right. Upper chest: No acute findings Other: None IMPRESSION: Degenerative disc and facet disease.  No acute bony abnormality. Electronically Signed   By: Janeece Mechanic M.D.   On: 03/05/2024 14:43   CT HEAD WO  CONTRAST ( ) Result Date: 03/05/2024 CLINICAL DATA:  Polytrauma, blunt EXAM: CT HEAD WITHOUT CONTRAST TECHNIQUE: Contiguous axial images were obtained from the base of the skull through the vertex without intravenous contrast. RADIATION DOSE REDUCTION: This exam was performed according to the departmental dose-optimization program which includes automated exposure control, adjustment of the mA and/or kV according to patient size and/or use of iterative reconstruction technique. COMPARISON:  07/05/2020 FINDINGS: Brain: No acute  intracranial abnormality. Specifically, no hemorrhage, hydrocephalus, mass lesion, acute infarction, or significant intracranial injury. Vascular: No hyperdense vessel or unexpected calcification. Skull: No acute calvarial abnormality. Sinuses/Orbits: No acute findings Other: None IMPRESSION: No acute intracranial abnormality. Electronically Signed   By: Janeece Mechanic M.D.   On: 03/05/2024 14:41    Procedures Procedures    Medications Ordered in ED Medications - No data to display  ED Course/ Medical Decision Making/ A&P Clinical Course as of 03/05/24 1542  Tue Mar 05, 2024  1538 DG Chest 2 View [AC]    Clinical Course User Index [AC] Lowery Rue, DO                                 Medical Decision Making Amount and/or Complexity of Data Reviewed Labs: ordered. Radiology: ordered. Decision-making details documented in ED Course.   SHAYLYN BAWA is here after she fell from her couch.  History of dementia.  Not on blood thinners.  Pain mostly to the right shoulder right elbow.  She did not think she lost consciousness.  She was lying on the couch watching TV.  She ended up on the floor.  She has some memory issues.  She remembers being on the couch.  She denies any headache neck pain.  Her son comes to see her a couple times a day.  She felt like she is in her normal state of health otherwise today.  Is not having any pain with urination.  No weakness numbness  tingling.  Overall somewhat limited history due to dementia history but sounds like she did have a fall she is tender in the right shoulder.  Will check CBC BMP CT head neck and x-rays of the right shoulder right elbow chest x-ray and pelvic x-ray.  I have tried to reach out the family but no one has picked up the phone.  Anticipate discharge home if workup is unremarkable.  EKG shows sinus rhythm.  No ischemic changes.  Doubt any cardiac or pulmonary process otherwise.  Thus far lab works unremarkable per my review and interpretation.  CT of her head and neck is unremarkable per radiology report.  Awaiting x-rays.  Overall x-rays per radiology report showed no definitive fracture or dislocation.  There is maybe a small joint effusion to the right elbow.  But she is not very tender can flex and extend pretty well to this area.  My suspicion is that this is likely a contusion.  Will place her in a sling for comfort and have her follow-up with orthopedics for repeat exam.  Recommend activity as tolerated, rest ice Tylenol .  Otherwise images were unremarkable.  Labs were unremarkable.  She understands follow-up with orthopedics.  Discharged in good condition.  This chart was dictated using voice recognition software.  Despite best efforts to proofread,  errors can occur which can change the documentation meaning.     Final Clinical Impression(s) / ED Diagnoses Final diagnoses:  Fall, initial encounter  Contusion of right shoulder, initial encounter    Rx / DC Orders ED Discharge Orders     None         Lowery Rue, DO 03/05/24 1539    Lowery Rue, DO 03/05/24 1543    Lowery Rue, DO 03/05/24 1547

## 2024-03-05 NOTE — ED Triage Notes (Signed)
 Pt to er via ems, per ems pt lives at home alone and called 911 for a fall, pt has c collar in place, pt states that she was watching tv and rolled off the couch, pt c/o R sided pain, pain to knee, arm, hip, shoulder and head.  No deformity noted, pt awake and oriented times two, pt doesn't know day of the week, month or year, re oriented pt.

## 2024-03-05 NOTE — ED Notes (Signed)
 Pt to ct

## 2024-03-05 NOTE — Discharge Instructions (Addendum)
 Overall I suspect that you have a bruise to your right shoulder and your right elbow.  I have given you information to follow-up with orthopedics.  As we discussed there was a little bit of fluid possibly in the right elbow that I suspect is from a contusion/bruise but I think it would be valuable to have a follow-up with orthopedics to see if pains improved for them to reevaluate your elbow.

## 2024-03-05 NOTE — ED Notes (Signed)
Ortho Tech paged.
